# Patient Record
Sex: Female | Born: 1955 | Race: Black or African American | Hispanic: No
Health system: Midwestern US, Community
[De-identification: ages and names within clinical notes are randomized; demographics above are authoritative.]

## PROBLEM LIST (undated history)

## (undated) DIAGNOSIS — M1632 Unilateral osteoarthritis resulting from hip dysplasia, left hip: Secondary | ICD-10-CM

## (undated) DIAGNOSIS — Z01818 Encounter for other preprocedural examination: Secondary | ICD-10-CM

## (undated) DIAGNOSIS — J342 Deviated nasal septum: Secondary | ICD-10-CM

## (undated) DIAGNOSIS — M25552 Pain in left hip: Secondary | ICD-10-CM

## (undated) DIAGNOSIS — M1990 Unspecified osteoarthritis, unspecified site: Secondary | ICD-10-CM

## (undated) DIAGNOSIS — F419 Anxiety disorder, unspecified: Secondary | ICD-10-CM

## (undated) DIAGNOSIS — H269 Unspecified cataract: Secondary | ICD-10-CM

## (undated) DIAGNOSIS — E785 Hyperlipidemia, unspecified: Secondary | ICD-10-CM

## (undated) DIAGNOSIS — I10 Essential (primary) hypertension: Secondary | ICD-10-CM

## (undated) DIAGNOSIS — E559 Vitamin D deficiency, unspecified: Secondary | ICD-10-CM

## (undated) DIAGNOSIS — N6009 Solitary cyst of unspecified breast: Secondary | ICD-10-CM

## (undated) HISTORY — PX: TUBAL LIGATION: SHX77

## (undated) HISTORY — DX: Essential (primary) hypertension: I10

## (undated) HISTORY — DX: Unspecified osteoarthritis, unspecified site: M19.90

## (undated) HISTORY — DX: Solitary cyst of unspecified breast: N60.09

## (undated) HISTORY — PX: ABDOMINAL HYSTERECTOMY: SHX81

## (undated) HISTORY — DX: Anxiety disorder, unspecified: F41.9

## (undated) HISTORY — DX: Vitamin D deficiency, unspecified: E55.9

## (undated) HISTORY — DX: Unspecified cataract: H26.9

## (undated) HISTORY — PX: CYSTECTOMY: SUR359

## (undated) HISTORY — DX: Hyperlipidemia, unspecified: E78.5

---
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
## 1986-06-15 HISTORY — PX: TUBAL LIGATION: SHX77

## 1986-06-15 HISTORY — PX: BILATERAL SALPINGECTOMY: SHX5743

## 1993-05-05 HISTORY — PX: BREAST EXCISIONAL BIOPSY: SUR124

## 1994-06-15 HISTORY — PX: ABDOMINAL HYSTERECTOMY: SHX81

## 1997-11-27 ENCOUNTER — Encounter: Admission: RE | Admit: 1997-11-27 | Discharge: 1998-02-25 | Payer: Self-pay | Admitting: Internal Medicine

## 1998-03-27 ENCOUNTER — Other Ambulatory Visit: Admission: RE | Admit: 1998-03-27 | Discharge: 1998-03-27 | Payer: Self-pay | Admitting: Obstetrics and Gynecology

## 1998-05-29 ENCOUNTER — Inpatient Hospital Stay (HOSPITAL_COMMUNITY): Admission: RE | Admit: 1998-05-29 | Discharge: 1998-06-01 | Payer: Self-pay | Admitting: Obstetrics and Gynecology

## 1998-06-15 HISTORY — PX: CYSTECTOMY: SUR359

## 1998-09-17 ENCOUNTER — Encounter: Admission: RE | Admit: 1998-09-17 | Discharge: 1998-12-16 | Payer: Self-pay | Admitting: Radiation Oncology

## 2005-10-19 ENCOUNTER — Ambulatory Visit: Payer: Self-pay | Admitting: Pulmonary Disease

## 2005-10-29 ENCOUNTER — Ambulatory Visit: Payer: Self-pay | Admitting: Internal Medicine

## 2007-02-07 ENCOUNTER — Ambulatory Visit: Payer: Self-pay | Admitting: Family Medicine

## 2007-06-17 ENCOUNTER — Telehealth (INDEPENDENT_AMBULATORY_CARE_PROVIDER_SITE_OTHER): Payer: Self-pay | Admitting: *Deleted

## 2007-06-21 ENCOUNTER — Encounter: Payer: Self-pay | Admitting: Pulmonary Disease

## 2007-06-28 DIAGNOSIS — M545 Low back pain, unspecified: Secondary | ICD-10-CM | POA: Insufficient documentation

## 2007-06-28 DIAGNOSIS — Z9079 Acquired absence of other genital organ(s): Secondary | ICD-10-CM | POA: Insufficient documentation

## 2007-06-28 DIAGNOSIS — I10 Essential (primary) hypertension: Secondary | ICD-10-CM | POA: Insufficient documentation

## 2007-06-28 DIAGNOSIS — E7800 Pure hypercholesterolemia, unspecified: Secondary | ICD-10-CM | POA: Insufficient documentation

## 2007-06-28 DIAGNOSIS — IMO0002 Reserved for concepts with insufficient information to code with codable children: Secondary | ICD-10-CM | POA: Insufficient documentation

## 2007-06-29 ENCOUNTER — Ambulatory Visit: Payer: Self-pay | Admitting: Pulmonary Disease

## 2007-06-29 LAB — CONVERTED CEMR LAB
Bilirubin Urine: NEGATIVE
Blood in Urine, dipstick: NEGATIVE
Glucose, Urine, Semiquant: NEGATIVE
Ketones, urine, test strip: NEGATIVE
Nitrite: NEGATIVE
Protein, U semiquant: NEGATIVE
Specific Gravity, Urine: 1.015
Urobilinogen, UA: NEGATIVE
WBC Urine, dipstick: NEGATIVE
pH: 6

## 2007-07-03 DIAGNOSIS — R519 Headache, unspecified: Secondary | ICD-10-CM | POA: Insufficient documentation

## 2007-07-03 DIAGNOSIS — R51 Headache: Secondary | ICD-10-CM | POA: Insufficient documentation

## 2007-07-03 LAB — CONVERTED CEMR LAB
ALT: 16 units/L (ref 0–35)
AST: 15 units/L (ref 0–37)
Albumin: 4.1 g/dL (ref 3.5–5.2)
Alkaline Phosphatase: 55 units/L (ref 39–117)
BUN: 15 mg/dL (ref 6–23)
Basophils Absolute: 0 10*3/uL (ref 0.0–0.1)
Basophils Relative: 0.4 % (ref 0.0–1.0)
Bilirubin, Direct: 0.1 mg/dL (ref 0.0–0.3)
CO2: 28 meq/L (ref 19–32)
Calcium: 9.6 mg/dL (ref 8.4–10.5)
Chloride: 101 meq/L (ref 96–112)
Cholesterol: 224 mg/dL (ref 0–200)
Creatinine, Ser: 1 mg/dL (ref 0.4–1.2)
Direct LDL: 166.7 mg/dL
Eosinophils Absolute: 0 10*3/uL (ref 0.0–0.6)
Eosinophils Relative: 0.3 % (ref 0.0–5.0)
GFR calc Af Amer: 75 mL/min
GFR calc non Af Amer: 62 mL/min
Glucose, Bld: 86 mg/dL (ref 70–99)
HCT: 43 % (ref 36.0–46.0)
HDL: 41.8 mg/dL (ref >39.0)
Hemoglobin: 14.9 g/dL (ref 12.0–15.0)
Lymphocytes Relative: 25.8 % (ref 12.0–46.0)
MCHC: 34.7 g/dL (ref 30.0–36.0)
MCV: 88.1 fL (ref 78.0–100.0)
Monocytes Absolute: 0.6 10*3/uL (ref 0.2–0.7)
Monocytes Relative: 13.5 % — ABNORMAL HIGH (ref 3.0–11.0)
Neutro Abs: 2.6 10*3/uL (ref 1.4–7.7)
Neutrophils Relative %: 60 % (ref 43.0–77.0)
Platelets: 291 10*3/uL (ref 150–400)
Potassium: 2.9 meq/L — ABNORMAL LOW (ref 3.5–5.1)
RBC: 4.88 M/uL (ref 3.87–5.11)
RDW: 12.3 % (ref 11.5–14.6)
Sodium: 137 meq/L (ref 135–145)
TSH: 2.1 microintl units/mL (ref 0.35–5.50)
Total Bilirubin: 0.8 mg/dL (ref 0.3–1.2)
Total CHOL/HDL Ratio: 5.4
Total Protein: 7.8 g/dL (ref 6.0–8.3)
Triglycerides: 67 mg/dL (ref 0–149)
VLDL: 13 mg/dL (ref 0–40)
WBC: 4.3 10*3/uL — ABNORMAL LOW (ref 4.5–10.5)

## 2008-06-15 HISTORY — PX: COLONOSCOPY: SHX174

## 2008-06-15 HISTORY — PX: MOUTH SURGERY: SHX715

## 2008-06-26 ENCOUNTER — Ambulatory Visit: Payer: Self-pay | Admitting: Family Medicine

## 2008-06-26 DIAGNOSIS — J069 Acute upper respiratory infection, unspecified: Secondary | ICD-10-CM | POA: Insufficient documentation

## 2008-08-30 ENCOUNTER — Ambulatory Visit: Payer: Self-pay | Admitting: Internal Medicine

## 2008-10-02 ENCOUNTER — Ambulatory Visit: Payer: Self-pay | Admitting: Internal Medicine

## 2009-07-09 ENCOUNTER — Telehealth: Payer: Self-pay | Admitting: Adult Health

## 2009-07-12 ENCOUNTER — Encounter: Payer: Self-pay | Admitting: Adult Health

## 2009-07-12 ENCOUNTER — Ambulatory Visit: Payer: Self-pay | Admitting: Family Medicine

## 2009-07-12 DIAGNOSIS — R5383 Other fatigue: Secondary | ICD-10-CM

## 2009-07-12 DIAGNOSIS — R5381 Other malaise: Secondary | ICD-10-CM | POA: Insufficient documentation

## 2009-07-12 DIAGNOSIS — M810 Age-related osteoporosis without current pathological fracture: Secondary | ICD-10-CM | POA: Insufficient documentation

## 2009-07-12 DIAGNOSIS — R748 Abnormal levels of other serum enzymes: Secondary | ICD-10-CM | POA: Insufficient documentation

## 2009-07-12 DIAGNOSIS — N3000 Acute cystitis without hematuria: Secondary | ICD-10-CM | POA: Insufficient documentation

## 2009-07-13 ENCOUNTER — Encounter: Payer: Self-pay | Admitting: Adult Health

## 2009-07-16 ENCOUNTER — Ambulatory Visit: Payer: Self-pay | Admitting: Pulmonary Disease

## 2009-07-16 DIAGNOSIS — E559 Vitamin D deficiency, unspecified: Secondary | ICD-10-CM

## 2009-07-16 HISTORY — DX: Vitamin D deficiency, unspecified: E55.9

## 2009-07-16 LAB — CONVERTED CEMR LAB
ALT: 14 units/L (ref 0–35)
AST: 19 units/L (ref 0–37)
Albumin: 4.2 g/dL (ref 3.5–5.2)
Alkaline Phosphatase: 45 units/L (ref 39–117)
BUN: 10 mg/dL (ref 6–23)
Basophils Absolute: 0 10*3/uL (ref 0.0–0.1)
Basophils Relative: 0.4 % (ref 0.0–3.0)
Bilirubin Urine: NEGATIVE
Bilirubin, Direct: 0.1 mg/dL (ref 0.0–0.3)
CO2: 29 meq/L (ref 19–32)
Calcium: 9.4 mg/dL (ref 8.4–10.5)
Casts: NONE SEEN /lpf
Chloride: 104 meq/L (ref 96–112)
Cholesterol: 195 mg/dL (ref 0–200)
Creatinine, Ser: 0.8 mg/dL (ref 0.4–1.2)
Crystals: NONE SEEN
Eosinophils Absolute: 0 10*3/uL (ref 0.0–0.7)
Eosinophils Relative: 0.4 % (ref 0.0–5.0)
GFR calc non Af Amer: 96.72 mL/min (ref >60)
Glucose, Bld: 87 mg/dL (ref 70–99)
HCT: 40.9 % (ref 36.0–46.0)
HDL: 47.6 mg/dL (ref >39.00)
Hemoglobin, Urine: NEGATIVE
Hemoglobin: 13.6 g/dL (ref 12.0–15.0)
Ketones, ur: NEGATIVE mg/dL
LDL Cholesterol: 138 mg/dL — ABNORMAL HIGH (ref 0–99)
Leukocytes, UA: NEGATIVE
Lymphocytes Relative: 24.9 % (ref 12.0–46.0)
Lymphs Abs: 1 10*3/uL (ref 0.7–4.0)
MCHC: 33.3 g/dL (ref 30.0–36.0)
MCV: 90.3 fL (ref 78.0–100.0)
Monocytes Absolute: 0.5 10*3/uL (ref 0.1–1.0)
Monocytes Relative: 13 % — ABNORMAL HIGH (ref 3.0–12.0)
Neutro Abs: 2.5 10*3/uL (ref 1.4–7.7)
Neutrophils Relative %: 61.3 % (ref 43.0–77.0)
Nitrite: NEGATIVE
Platelets: 215 10*3/uL (ref 150.0–400.0)
Potassium: 3.4 meq/L — ABNORMAL LOW (ref 3.5–5.1)
Protein, ur: NEGATIVE mg/dL
RBC / HPF: NONE SEEN (ref <3)
RBC: 4.53 M/uL (ref 3.87–5.11)
RDW: 12.5 % (ref 11.5–14.6)
Sodium: 139 meq/L (ref 135–145)
Specific Gravity, Urine: 1.026 (ref 1.005–1.030)
Squamous Epithelial / LPF: NONE SEEN /lpf
TSH: 2.5 microintl units/mL (ref 0.35–5.50)
Total Bilirubin: 0.7 mg/dL (ref 0.3–1.2)
Total CHOL/HDL Ratio: 4
Total Protein: 7.8 g/dL (ref 6.0–8.3)
Triglycerides: 48 mg/dL (ref 0.0–149.0)
Urine Glucose: NEGATIVE mg/dL
Urobilinogen, UA: 0.2 (ref 0.0–1.0)
VLDL: 9.6 mg/dL (ref 0.0–40.0)
Vit D, 25-Hydroxy: 10 ng/mL — ABNORMAL LOW (ref 30–89)
WBC: 4 10*3/uL — ABNORMAL LOW (ref 4.5–10.5)
pH: 6 (ref 5.0–8.0)

## 2009-08-20 ENCOUNTER — Ambulatory Visit: Payer: Self-pay | Admitting: Obstetrics & Gynecology

## 2009-09-11 ENCOUNTER — Ambulatory Visit (HOSPITAL_COMMUNITY): Admission: RE | Admit: 2009-09-11 | Discharge: 2009-09-11 | Payer: Self-pay | Admitting: Obstetrics & Gynecology

## 2009-09-18 ENCOUNTER — Encounter: Admission: RE | Admit: 2009-09-18 | Discharge: 2009-09-18 | Payer: Self-pay | Admitting: Obstetrics & Gynecology

## 2009-09-30 ENCOUNTER — Ambulatory Visit: Payer: Self-pay | Admitting: Family Medicine

## 2009-09-30 DIAGNOSIS — M79609 Pain in unspecified limb: Secondary | ICD-10-CM | POA: Insufficient documentation

## 2009-09-30 DIAGNOSIS — S92919A Unspecified fracture of unspecified toe(s), initial encounter for closed fracture: Secondary | ICD-10-CM | POA: Insufficient documentation

## 2009-10-18 ENCOUNTER — Telehealth (INDEPENDENT_AMBULATORY_CARE_PROVIDER_SITE_OTHER): Payer: Self-pay | Admitting: *Deleted

## 2009-10-18 ENCOUNTER — Ambulatory Visit: Payer: Self-pay | Admitting: Pulmonary Disease

## 2009-10-22 LAB — CONVERTED CEMR LAB: Vit D, 25-Hydroxy: 35 ng/mL (ref 30–89)

## 2010-05-13 ENCOUNTER — Telehealth (INDEPENDENT_AMBULATORY_CARE_PROVIDER_SITE_OTHER): Payer: Self-pay | Admitting: *Deleted

## 2010-07-06 ENCOUNTER — Encounter: Payer: Self-pay | Admitting: Obstetrics & Gynecology

## 2010-07-15 NOTE — Miscellaneous (Signed)
Summary: [facility]  Clinical Lists Changes  Medications: Added new medication of MIRALAX   POWD (POLYETHYLENE GLYCOL 3350) As per prep  instructions. - Signed Added new medication of DULCOLAX 5 MG  TBEC (BISACODYL) Day before procedure take 2 at 3pm and 2 at 8pm. - Signed Added new medication of REGLAN 10 MG  TABS (METOCLOPRAMIDE HCL) As per prep instructions. - Signed Rx of MIRALAX   POWD (POLYETHYLENE GLYCOL 3350) As per prep  instructions.;  #255gm x 0;  Signed;  Entered by: Ezra Sites RN;  Authorized by: Hart Carwin;  Method used: [id number] [address], [place], [place]  [address], Ph: [phone] or [phone], Fax: [phone] Rx of DULCOLAX 5 MG  TBEC (BISACODYL) Day before procedure take 2 at 3pm and 2 at 8pm.;  #4 x 0;  Signed;  Entered by: Ezra Sites RN;  Authorized by: Hart Carwin;  Method used: [facility] [place], [place]  [address], Ph: [phone] or [phone], Fax: [phone] Rx of REGLAN 10 MG  TABS (METOCLOPRAMIDE HCL) As per prep instructions.;  #2 x 0;  Signed;  Entered by: Ezra Sites RN;  Authorized by: Hart Carwin;  Method used: Electronically [facility] [place], [place]  [address], Ph: [phone] or [phone], Fax: [phone] Observations: Added new observation of NKA: T (08/30/2008 15:39)    Prescriptions: REGLAN 10 MG  TABS (METOCLOPRAMIDE HCL) As per prep instructions.  #2 x 0   Entered by:   Ezra Sites RN   Authorized by:   Hart Carwin   Signed by:   Ezra Sites RN on 08/30/2008   Method used:   Electronically to        [facility] (retail)       [address]       [place], [place]  [address]       Ph: [phone] or [phone]       Fax: [phone]   RxID:   [id number] DULCOLAX 5 MG  TBEC (BISACODYL) Day before procedure take 2 at 3pm and 2 at 8pm.  #4 x 0   Entered by:   Ezra Sites RN   Authorized by:   Hart Carwin   Signed by:   Ezra Sites RN on 08/30/2008   Method used:   Electronically to        [facility] (retail)       [address]       [place], [place]  [address]       Ph: [phone] or [phone]       Fax: [phone]   RxID:   [id number] MIRALAX   POWD (POLYETHYLENE GLYCOL 3350) As per prep  instructions.  #255gm x 0   Entered by:   Ezra Sites RN   Authorized by:   Hart Carwin   Signed by:   Ezra Sites RN on 08/30/2008   Method used:   Electronically to        [facility] (retail)       [address]       [place], [place]  [address]       Ph: [phone] or [phone]       Fax: [phone]   RxID:   [id number]

## 2010-07-15 NOTE — Assessment & Plan Note (Signed)
Summary: cpx/ mbw   Chief Complaint:  Yearly CPX....  History of Present Illness: 55 y/o BF here for a yearly follow up and physical exam... Jacqueline Waters looks great, is doing well and has no specific complaints today... 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  Current Problems:  PHYSICAL EXAMINATION (ICD-V70.0) - she has turned 51 therefore will need referral to GI for routine colon... reminded to get her GYN check and mammogram... HYPERTENSION (ICD-401.9) - controlled on LOTREL & MICARDIS/HCT... takes meds regularly and tolerating well without side effects... BP's all 120's/ 80's at home and work... 126/64 here today... denies HA, fatigue, visual changes, CP, palipit, dizziness, syncope, dyspnea, edema, etc... Family Hx of CAD (ICD-414.00) - she had a Cardiolite in 2001= normal without ischemia and EF=73%... HYPERCHOLESTEROLEMIA (ICD-272.0) - she has not been on medication, trying to control w/ diet alone... last FLP 5/07 showed TChol 190, TG 40, HDL 44, LDL138... her LDL's have been betw 124-168 for years and we discussed starting a statin med if results are similiar today... HYSTERECTOMY, HX OF (ICD-V45.77) DEGENERATIVE DISC DISEASE (ICD-722.6) BACK PAIN, LUMBAR (ICD-724.2) HEADACHE, MIXED (ICD-784.0) - responds well to CEPHADYN 50-650 (Butalbital-Acetomenophen) perscribed by drLown in the past... she would like to continue this med...     Current Allergies: No known allergies   Past Medical History:        HYPERTENSION (ICD-401.9)    Family Hx of CAD (ICD-414.00)    HYPERCHOLESTEROLEMIA (ICD-272.0)    HYSTERECTOMY, HX OF (ICD-V45.77)    DEGENERATIVE DISC DISEASE (ICD-722.6)    BACK PAIN, LUMBAR (ICD-724.2)    HEADACHE, MIXED (ICD-784.0)      Past Surgical History:    Hysterectomy     Review of Systems  The patient denies fever, chills, sweats, anorexia, fatigue, weakness, malaise, weight  loss, sleep disorder, blurring, diplopia, eye irritation, eye discharge, vision loss, eye pain, photophobia, earache, ear discharge, tinnitus, decreased hearing, nasal congestion, nosebleeds, sore throat, hoarseness, chest pain, palpitations, syncope, dyspnea on exertion, orthopnea, PND, peripheral edema, cough, dyspnea at rest, excessive sputum, hemoptysis, wheezing, pleurisy, nausea, vomiting, diarrhea, constipation, change in bowel habits, abdominal pain, melena, hematochezia, jaundice, gas/bloating, indigestion/heartburn, dysphagia, odynophagia, dysuria, hematuria, urinary frequency, urinary hesitancy, nocturia, incontinence, back pain, joint pain, joint swelling, muscle cramps, muscle weakness, stiffness, arthritis, sciatica, restless legs, leg pain at night, leg pain with exertion, rash, itching, dryness, suspicious lesions, paralysis, paresthesias, seizures, tremors, vertigo, transient blindness, frequent falls, frequent headaches, difficulty walking, depression, anxiety, memory loss, confusion, cold intolerance, heat intolerance, polydipsia, polyphagia, polyuria, unusual weight change, abnormal bruising, bleeding, enlarged lymph nodes, urticaria, allergic rash, hay fever, and recurrent infections.      Physical Exam  WD, WN, 55 y/o BF in NAD... GENERAL:  Alert & oriented; pleasant & cooperative. HEENT:  /AT, EOM-wnl, PERRLA, Fundi-benign, EACs-clear, TMs-wnl, NOSE-clear, THROAT-clear & wnl. NECK:  Supple w/ full ROM; no JVD; normal carotid impulses w/o bruits; no thyromegaly or nodules palpated; no lymphadenopathy. CHEST:  Clear to P & A; without wheezes/ rales/ or rhonchi. HEART:  Regular Rhythm; without murmurs/ rubs/ or gallops. ABDOMEN:  Soft & nontender; normal bowel sounds; no organomegaly or masses detected. EXT: without deformities or arthritic changes; no varicose veins/ venous insuffic/ or edema. NEURO:  CN's intact; motor testing normal; sensory testing normal; gait normal &  balance OK. DERM:  No lesions noted; no rash etc...       Impression & Recommendations:  Problem #  1:  Preventive Health Care (ICD-V70.0) Discussed GYN eval, mammogram, colonoscopy... she will sched at her convenience...  CXR- NAD EKG- NSR, NSSTTWA. LABS: FLP= TChol 224, TG 67, HDL 42, LDL 167... Rec ~ START LIPITOR 20 mg daily... f/u FLP in 2-3 months.             CBC= normal             CMET= normal             TSH= normal  Problem # 2:  HYPERTENSION (ICD-401.9) Controlled... continue same Rx... refills per request. Her updated medication list for this problem includes:    Lotrel 10-20 Mg Caps (Amlodipine besy-benazepril hcl) .[name]... Take 1 cap daily    Micardis Hct 80-12.5 Mg Tabs (Telmisartan-hctz) .[name]... Take 1 tablet by mouth once a day   Problem # 3:  HYPERCHOLESTEROLEMIA (ICD-272.0) If labs return w/ elevated Chol we discussed starting Statin Rx, esp w/ her family hx...  Complete Medication List: 1)  Lotrel 10-20 Mg Caps (Amlodipine besy-benazepril hcl) .... Take 1 cap daily 2)  Micardis Hct 80-12.5 Mg Tabs (Telmisartan-hctz) .... Take 1 tablet by mouth once a day 3)  Potassium Chloride Crys Cr 10 Meq Tbcr (Potassium chloride crys cr) .... Take 2 caps daily as directed 4)  Cephadyn 50-650 Mg Tabs (Butalbital-acetaminophen) .[name].. 1 tab by mouth q 6-8 h as needed for headaches   Patient Instructions: 1)  Today we refilled your 3 meds (see below) for 90 day supplies.Marland KitchenMarland Kitchen 2)  We also did your yearly lab work... please call the "phone tree" in a few days for your lab results.Marland KitchenMarland Kitchen 3)  If your Cholesterol returns elevated we may want to start a low dose of Lipitor... 4)  Limit your Sodium (Salt). 5)  Try to start an exercise program as discussed...    Prescriptions: POTASSIUM CHLORIDE CRYS CR 10 MEQ  TBCR (POTASSIUM CHLORIDE CRYS CR) take 2 caps daily as directed  #180 x 4   Entered and Authorized by:   Michele Mcalpine MD   Signed by:   Michele Mcalpine MD on 06/29/2007   Method  used:   Print then Give to Patient   RxID:   [id number] MICARDIS HCT 80-12.5 MG  TABS (TELMISARTAN-HCTZ) Take 1 tablet by mouth once a day  #90 x 4   Entered and Authorized by:   Michele Mcalpine MD   Signed by:   Michele Mcalpine MD on 06/29/2007   Method used:   Print then Give to Patient   RxID:   [id number] LOTREL 10-20 MG  CAPS (AMLODIPINE BESY-BENAZEPRIL HCL) take 1 cap daily  #90 x 4   Entered and Authorized by:   Michele Mcalpine MD   Signed by:   Michele Mcalpine MD on 06/29/2007   Method used:   Print then Give to Patient   RxID:   [id number]  ]

## 2010-07-15 NOTE — Miscellaneous (Signed)
  Medications Added CEPHADYN 50-650 MG  TABS (BUTALBITAL-ACETAMINOPHEN) 1 tab by mouth Q 6-8 H as needed for headaches       Clinical Lists Changes  Medications: Added new medication of CEPHADYN 50-650 MG  TABS (BUTALBITAL-ACETAMINOPHEN) 1 tab by mouth Q 6-8 H as needed for headaches - Signed Rx of CEPHADYN 50-650 MG  TABS (BUTALBITAL-ACETAMINOPHEN) 1 tab by mouth Q 6-8 H as needed for headaches;  #50 x 1;  Signed;  Entered by: Michele Mcalpine MD;  Authorized by: Michele Mcalpine MD;  Method used: Historical Observations: Added new observation of LLIMPORTMEDS: completed (06/21/2007 18:14)    Prescriptions: CEPHADYN 50-650 MG  TABS (BUTALBITAL-ACETAMINOPHEN) 1 tab by mouth Q 6-8 H as needed for headaches  #50 x 1   Entered and Authorized by:   Michele Mcalpine MD   Signed by:   Michele Mcalpine MD on 06/21/2007   Method used:   Historical   RxID:   [id number]

## 2010-07-15 NOTE — Progress Notes (Signed)
Summary: Cough > augmentin, MMW, delsym  Phone Note Call from Patient Call back at [phone]   Caller: Patient Summary of Call: Jacqueline Waters has had bad sore throat and coughing for 5 days.  Needs something for cough.  [facility] [place]  Doesn't know phone #. Initial call taken by: Leonette Monarch,  May 13, 2010 8:26 AM  Follow-up for Phone Call        LM for pt to call back to get detailed symptoms. Abigail Miyamoto RN  May 13, 2010 9:11 AM   Additional Follow-up for Phone Call Additional follow up Details #1::        Pt c/o sorethroat, hoarseness, prod cough Chilton Si- clear).  Denies fever.  NKDA.  [facility] Abigail Miyamoto RN  May 13, 2010 12:38 PM      Additional Follow-up for Phone Call Additional follow up Details #2::    per SN----ok for augmentin 875mg    #14  1 by mouth two times a day ---if not pcn allergic----mmw #4oz  1 tsp gargle and swallow four times daily and use otc delsym as needed for cough.  thanks Randell Loop CMA  May 13, 2010 1:53 PM   Called, spoke with pt.  She verified no pcn allergy.  Informed of above recs per SN.  She verbalized understanding and aware rxs [facility].  Gweneth Dimitri RN  May 13, 2010 2:07 PM   New/Updated Medications: AUGMENTIN 875-125 MG TABS (AMOXICILLIN-POT CLAVULANATE) Take 1 tablet by mouth two times a day * MAJIC MOUTHWASH 1 tsp garle and swallow four times daily Prescriptions: MAJIC MOUTHWASH 1 tsp garle and swallow four times daily  #4 oz x 0   Entered by:   Gweneth Dimitri RN   Authorized by:   Michele Mcalpine MD   Signed by:   Gweneth Dimitri RN on 05/13/2010   Method used:   [facility]ed to ...       [id number] ([facility]ail)       [address]       [place], [place]  [address]       Ph: [phone] or [phone]       Fax: [phone]   RxID:   [id number] AUGMENTIN 875-125 MG TABS (AMOXICILLIN-POT CLAVULANATE) Take 1 tablet by mouth two times a day  #14 x 0   Entered by:    Gweneth Dimitri RN   Authorized by:   Michele Mcalpine MD   Signed by:   Gweneth Dimitri RN on 05/13/2010   Method used:   [facility]ronically to        [facility] (retail)       [address]       [place], [place]  [address]       Ph: [phone] or [phone]       Fax: [phone]   RxID:   [id number]

## 2010-07-15 NOTE — Assessment & Plan Note (Signed)
Summary: BOTOX/ALJ  Nurse Visit

## 2010-07-15 NOTE — Progress Notes (Signed)
Summary: need refill  Medications Added LOTREL 10-20 MG  CAPS (AMLODIPINE BESY-BENAZEPRIL HCL) once daily       Phone Note Call from Patient   Caller: Patient Call For: nadel Summary of Call: need refill on lotrel [facility] [address] Patient's chart has been requested.  Initial call taken by: Rickard Patience,  June 17, 2007 3:12 PM  Follow-up for Phone Call        rx was sent electronically.  lm with pts son to make her aware. Follow-up by: Vernie Murders,  June 17, 2007 3:20 PM    New/Updated Medications: LOTREL 10-20 MG  CAPS (AMLODIPINE BESY-BENAZEPRIL HCL) once daily   Prescriptions: LOTREL 10-20 MG  CAPS (AMLODIPINE BESY-BENAZEPRIL HCL) once daily  #30 x 0   Entered by:   Vernie Murders   Authorized by:   Michele Mcalpine MD   Signed by:   Vernie Murders on 06/17/2007   Method used:   Electronically sent to ...       [facility] [id number]       [address]       [place], [place]  [address]       Ph: [phone] or [phone]       Fax: [phone]   RxID:   [id number]

## 2010-07-15 NOTE — Progress Notes (Signed)
Summary: order  Phone Note Call from Patient   Caller: Patient Call For: nadel Summary of Call: need order put in to have vitamin D checked Initial call taken by: Rickard Patience,  Oct 18, 2009 8:26 AM  Follow-up for Phone Call        Order put in computer for vit d level per Tammy's recommendations at last ov. Abigail Miyamoto RN  Oct 18, 2009 9:27 AM

## 2010-07-15 NOTE — Procedures (Signed)
Summary: Colonoscopy   Colonoscopy  Procedure date:  10/02/2008  Findings:      Location:  [facility].    Procedures Next Due Date:    Colonoscopy: 10/2018  COLONOSCOPY PROCEDURE REPORT  PATIENT:  Jacqueline Waters, Jacqueline Waters  MR#:  [id number] BIRTHDATE:   1955/10/16, 52 yrs. old   GENDER:   female  ENDOSCOPIST:   Hedwig Morton. Juanda Chance, MD Referred by: Alroy Dust, M.D.  PROCEDURE DATE:  10/02/2008 PROCEDURE:  Average-risk screening colonoscopy ASA CLASS:   Class I INDICATIONS: screening   MEDICATIONS:    Versed 7 mg, Fentanyl 50 mcg  DESCRIPTION OF PROCEDURE:   After the risks benefits and alternatives of the procedure were thoroughly explained, informed consent was obtained.  Digital rectal exam was performed and revealed no rectal masses.   The LB [id number] endoscope was introduced through the anus and advanced to the cecum, which was identified by both the appendix and ileocecal valve, without limitations.  The quality of the prep was good, using MiraLax.  The instrument was then slowly withdrawn as the colon was fully examined. <<PROCEDUREIMAGES>>      <<OLD IMAGES>>  FINDINGS:  No polyps or cancers were seen (see image1, image2, image3, and image4).   Retroflexed views in the rectum revealed no abnormalities.    The scope was then withdrawn from the patient and the procedure completed.  COMPLICATIONS:   None  ENDOSCOPIC IMPRESSION:  1) No polyps or cancers  normal exam to the cecum RECOMMENDATIONS:  1) hemoccults q 2-3 years  2) high fiber diet  REPEAT EXAM:   In 10 year(s) for.   _______________________________ Hedwig Morton. Juanda Chance, MD  CC: Alroy Dust, MD

## 2010-07-15 NOTE — Assessment & Plan Note (Signed)
Summary: BROKEN TOE ?   Vital Signs:  Patient profile:   55 year old female Height:      62.5 inches Weight:      138.38 pounds BMI:     25.00 Temp:     97.7 degrees F oral Pulse rate:   70 / minute Pulse rhythm:   regular  Vitals Entered By: Benny Lennert CMA Duncan Dull) (September 30, 2009 9:33 AM) CC: ? broken toe   History of Present Illness:  55 year old female who hit her R 5th toe on 09/27/2009, directly struck, now with focal pain at the 5th digit. 5th toe. Mild swelling and focal pain.  REVIEW OF SYSTEMS  GEN: No systemic complaints, no fevers, chills, sweats, or other acute illnesses MSK: Detailed in the HPI GI: tolerating PO intake without difficulty Neuro: No numbness, parasthesias, or tingling associated. Otherwise the pertinent positives of the ROS are noted above.     GEN: Well-developed,well-nourished,in no acute distress; alert,appropriate and cooperative throughout examination HEENT: Normocephalic and atraumatic without obvious abnormalities. No apparent alopecia or balding. Ears, externally no deformities PULM: Breathing comfortably in no respiratory distress EXT: No clubbing, cyanosis, or edema PSYCH: Normally interactive. Cooperative during the interview. Pleasant. Friendly and conversant. Not anxious or depressed appearing. Normal, full affect.   R foot: 5th toe, ip TTP, some swelling. No bruising.  other bony anatomy NT  Allergies: No Known Drug Allergies   Impression & Recommendations:  Problem # 1:  FRACTURE, TOE (ICD-826.0) 09/27/2009, doi  XR, R 5th toe Indication pain Nondisplaced, nonangulated, oblique fx in good position  tylenol, motrin as needed activity mod  Complete Medication List: 1)  Lotrel 10-20 Mg Caps (Amlodipine besy-benazepril hcl) .... Take 1 cap daily 2)  Micardis Hct 80-12.5 Mg Tabs (Telmisartan-hctz) .... Take 1 tablet by mouth once a day 3)  Potassium Chloride Crys Cr 10 Meq Tbcr (Potassium chloride crys cr) .... Take 2  caps daily as directed 4)  Cephadyn 50-650 Mg Tabs (Butalbital-acetaminophen) .[name].. 1 tab by mouth q 6-8 h as needed for headaches 5)  Vitamin D (ergocalciferol) 50000 Unit Caps (Ergocalciferol) .[name].. 1 by mouth weekly  Other Orders: Radiology other (Radiology Other)

## 2010-07-15 NOTE — Progress Notes (Signed)
Summary: labs  Phone Note Call from Patient Call back at [phone]   Caller: Patient Call For: tammy p / Kriste Basque Reason for Call: Talk to Nurse Summary of Call: need order for labs put in for them to be drawn at [facility] this week. Initial call taken by: Eugene Gavia,  July 09, 2009 1:08 PM  Follow-up for Phone Call        pt scheduled to see TP 07-16-2009 for f/u appt and refill on meds.  Pt was last seen by SN 06-29-2007.  Please advise what labs you want drawn and diag codes.  Thank you.  Aundra Millet Reynolds LPN  July 09, 2009 1:58 PM   Additional Follow-up for Phone Call Additional follow up Details #1::        cbc, bmet, hepatic, tsh, vit d , ua , lipid fasitng labs.  Additional Follow-up by: Rubye Oaks NP,  July 09, 2009 4:54 PM    Additional Follow-up for Phone Call Additional follow up Details #2::    [facility] for pt TCB Vernie Murders  July 09, 2009 4:59 PM   Additional Follow-up for Phone Call Additional follow up Details #3:: Details for Additional Follow-up Action Taken: orders faxed to [facility].  Aundra Millet Reynolds LPN  July 09, 2009 5:14 PM

## 2010-07-15 NOTE — Assessment & Plan Note (Signed)
Summary: COLD/RBH   Vital Signs:  Patient Profile:   55 Years Old Female Temp:     97.9 degrees F (36.61 degrees C) oral Pulse rate:   80 / minute Pulse rhythm:   regular BP sitting:   120 / 80  (left arm) Cuff size:   regular  Vitals Entered By: Silas Sacramento (June 26, 2008 8:16 AM)                 Chief Complaint:  Pryor Ochoa in right eye, chills, and head congestion.  Acute Visit History:      The patient complains of cough, eye symptoms, and nasal discharge.  These symptoms began 2 days ago.  She denies constipation, diarrhea, earache, fever, genitourinary symptoms, headache, musculoskeletal symptoms, nausea, rash, sinus problems, sore throat, and vomiting.  Other comments include: Sunday started to feel bad. Right eye started to hurt and got a really bad pain in her right eye.  This AM had a gook in her eye.   Taking Mucinex.        There is no history of wheezing, sleep interference, shortness of breath, respiratory retractions, tachypnea, cyanosis, or interference with oral intake associated with her cough.        Urine output has been normal.  She is tolerating clear liquids.           Current Allergies: No known allergies      Review of Systems      See HPI   Physical Exam  General:     Well-developed,well-nourished,in no acute distress; alert,appropriate and cooperative throughout examination Head:     Normocephalic and atraumatic without obvious abnormalities. No apparent alopecia or balding. Nose:     no external deformity.   Mouth:     Oral mucosa and oropharynx without lesions or exudates.  Teeth in good repair. Neck:     No deformities, masses, or tenderness noted. Lungs:     Normal respiratory effort, chest expands symmetrically. Lungs are clear to auscultation, no crackles or wheezes. Heart:     Normal rate and regular rhythm. S1 and S2 normal without gallop, murmur, click, rub or other extra sounds. Extremities:     No clubbing, cyanosis,  edema, or deformity noted with normal full range of motion of all joints.     Eye Exam  Visual Fields     OD: normal right     OS: normal left Ocular Motility     OD: normal right     OS: normal left Adnexa and Eyelids     OD: normal right     OS: normal left Conjunctiva     OD: normal right     OS: normal left Anterior Segment     OD: normal right     OS: normal left    Impression & Recommendations:  Problem # 1:  URI (ICD-465.9) Assessment: New Supportive care, cont with Mucinex and fluids  Eye pain at R eye, woke up from sleep. Examined with fluroscein and UV light - no corneal abrasion  Complete Medication List: 1)  Lotrel 10-20 Mg Caps (Amlodipine besy-benazepril hcl) .... Take 1 cap daily 2)  Micardis Hct 80-12.5 Mg Tabs (Telmisartan-hctz) .... Take 1 tablet by mouth once a day 3)  Potassium Chloride Crys Cr 10 Meq Tbcr (Potassium chloride crys cr) .... Take 2 caps daily as directed 4)  Cephadyn 50-650 Mg Tabs (Butalbital-acetaminophen) .[name].. 1 tab by mouth q 6-8 h as needed for headaches

## 2010-07-15 NOTE — Assessment & Plan Note (Signed)
Summary: rov/labs/apc   CC:  f/u labs and refills.  History of Present Illness: 55 y/o BF  with known history of HTN  July 16, 2009 --Presents for follow up and med refills. She is doing well, with no complaints. Feeling good. Working at [employer]. now. We reviewed her labs. Chol w/ LDL at 138. she is working on diet and exercise. We discussed taking her potassium daily as directed, it was slightly low at 3.4. Vitamin D was low at 10, she will begin calcium w/ d . Denies chest pain, dyspnea, orthopnea, hemoptysis, fever, n/v/d, edema, headache,recent travel or antibiotics.    Current Medications (verified): 1)  Lotrel 10-20 Mg  Caps (Amlodipine Besy-Benazepril Hcl) .... Take 1 Cap Daily 2)  Micardis Hct 80-12.5 Mg  Tabs (Telmisartan-Hctz) .... Take 1 Tablet By Mouth Once A Day 3)  Potassium Chloride Crys Cr 10 Meq  Tbcr (Potassium Chloride Crys Cr) .... Take 2 Caps Daily As Directed 4)  Cephadyn 50-650 Mg  Tabs (Butalbital-Acetaminophen) .[name].. 1 Tab By Mouth Q 6-8 H As Needed For Headaches  Allergies (verified): No Known Drug Allergies  Past History:  Past Surgical History: Last updated: 06/29/2007 Hysterectomy  Risk Factors: Smoking Status: never (06/28/2007)  Past Medical History: PHYSICAL EXAMINATION (ICD-V70.0) -she is due for her mammogram and pap--she is going to make apporintment this month Feb 2011>>  colon>>4/10 norm repeat in 2020  HYPERTENSION (ICD-401.9) - controlled on LOTREL & MICARDIS/HCT...   Family Hx of CAD (ICD-414.00) - she had a Cardiolite in 2001= normal without ischemia and EF=73%...  HYPERCHOLESTEROLEMIA (ICD-272.0) -  06/2009>>TC 195, LDL 135 -diet and exercise.   HYSTERECTOMY, HX OF (ICD-V45.77)  DEGENERATIVE DISC DISEASE (ICD-722.6)  BACK PAIN, LUMBAR (ICD-724.2)  HEADACHE, MIXED (ICD-784.0) - responds well to CEPHADYN 50-650 (Butalbital-Acetomenophen) perscribed by drLown in the past... she would like to continue this  med...     Review of Systems      See HPI  Vital Signs:  Patient profile:   55 year old female Height:      62.5 inches Weight:      138.38 pounds BMI:     25.00 O2 Sat:      98 % on Room air Temp:     97.7 degrees F oral Pulse rate:   70 / minute BP sitting:   130 / 80  (right arm) Cuff size:   regular  Vitals Entered By: Elray Buba RN (July 16, 2009 9:34 AM)  O2 Flow:  Room air CC: f/u labs,refills Is Patient Diabetic? No Comments Medications reviewed with patient  Elray Buba RN  July 16, 2009 9:49 AM    Physical Exam  Additional Exam:  WD, WN, 55 y/o BF in NAD... GENERAL:  Alert & oriented; pleasant & cooperative. HEENT:  [place]/AT, EOM-wnl, PERRLA, Fundi-benign, EACs-clear, TMs-wnl, NOSE-clear, THROAT-clear & wnl. NECK:  Supple w/ full ROM; no JVD; normal carotid impulses w/o bruits; no thyromegaly or nodules palpated; no lymphadenopathy. CHEST:  Clear to P & A; without wheezes/ rales/ or rhonchi. HEART:  Regular Rhythm; without murmurs/ rubs/ or gallops. ABDOMEN:  Soft & nontender; normal bowel sounds; no organomegaly or masses detected. EXT: without deformities or arthritic changes; no varicose veins/ venous insuffic/ or edema. NEURO:  CN's intact; motor testing normal; sensory testing normal; gait normal & balance OK. DERM:  No lesions noted; no rash etc...     Pre-Spirometry FVC    Pred: 3.07 L/min     % Pred:  0 % FEV1    Pred: 2.51 L     % Pred: 0 % FEV1/FVC  Pred: 0.81 %    FEF 25-75  Pred: 2.72 L/min     Impression & Recommendations:  Problem # 1:  VITAMIN D DEFICIENCY (ICD-268.9) need to start oscal d two times a day along  w/ vitamin d 50k weekly will recheck in 3 months   Problem # 2:  HYPERTENSION (ICD-401.9) controlled on rx.  Her updated medication list for this problem includes:    Lotrel 10-20 Mg Caps (Amlodipine besy-benazepril hcl) .[name]... Take 1 cap daily    Micardis Hct 80-12.5 Mg Tabs (Telmisartan-hctz) .[name]... Take 1 tablet by  mouth once a day  Problem # 3:  HYPERCHOLESTEROLEMIA (ICD-272.0) diet and exercise discussed.   Problem # 4:  PHYSICAL EXAMINATION (ICD-V70.0)  she has assured me she will set up mammogram and pap.   Orders: Est. Patient 40-64 years ([id number])  Medications Added to Medication List This Visit: 1)  Vitamin D (ergocalciferol) 50000 Unit Caps (Ergocalciferol) .[name].. 1 by mouth weekly  Complete Medication List: 1)  Lotrel 10-20 Mg Caps (Amlodipine besy-benazepril hcl) .... Take 1 cap daily 2)  Micardis Hct 80-12.5 Mg Tabs (Telmisartan-hctz) .... Take 1 tablet by mouth once a day 3)  Potassium Chloride Crys Cr 10 Meq Tbcr (Potassium chloride crys cr) .... Take 2 caps daily as directed 4)  Cephadyn 50-650 Mg Tabs (Butalbital-acetaminophen) .[name].. 1 tab by mouth q 6-8 h as needed for headaches 5)  Vitamin D (ergocalciferol) 50000 Unit Caps (Ergocalciferol) .[name].. 1 by mouth weekly  Patient Instructions: 1)  Add Calcium with vitamin d two times a day  2)  Begin Vitamin D 84132 units weekly 3)  Recheck vitamin d level in 3 months at office viist.  4)  Diet and exercise.  5)  follow up as scheduled and as needed  Prescriptions: CEPHADYN 50-650 MG  TABS (BUTALBITAL-ACETAMINOPHEN) 1 tab by mouth Q 6-8 H as needed for headaches  #50 x 1   Entered and Authorized by:   Rubye Oaks NP   Signed by:   Rubye Oaks NP on 07/16/2009   Method used:   Electronically to        [facility]* (retail)       [address]       [address] [place] [address]       [place], [place]  [address]       Ph: [phone]       Fax: [phone]   RxID:   [id number] POTASSIUM CHLORIDE CRYS CR 10 MEQ  TBCR (POTASSIUM CHLORIDE CRYS CR) take 2 caps daily as directed  #180 Tablet x 3   Entered and Authorized by:   Rubye Oaks NP   Signed by:   Rubye Oaks NP on 07/16/2009   Method used:   Electronically to        [facility]* (retail)       [address]       [address]mail[address]       [place], [place]  [address]       Ph: [phone]       Fax: [phone]   RxID:   [id number] MICARDIS HCT 80-12.5 MG  TABS (TELMISARTAN-HCTZ) Take 1 tablet by mouth once a day  #90 x 3   Entered and Authorized by:   Rubye Oaks NP   Signed by:   Rubye Oaks NP on  07/16/2009   Method used:   Electronically to        [facility]* (retail)       [address]       [address]mail[address]       [place], [place]  [address]       Ph: [phone]       Fax: [phone]   RxID:   [id number] LOTREL 10-20 MG  CAPS (AMLODIPINE BESY-BENAZEPRIL HCL) take 1 cap daily  #90 x 3   Entered and Authorized by:   Rubye Oaks NP   Signed by:   Rubye Oaks NP on 07/16/2009   Method used:   Electronically to        [facility]* (retail)       [address]       [address] [place] [address]       [place], [place]  [address]       Ph: [phone]       Fax: [phone]   RxID:   [id number] VITAMIN D (ERGOCALCIFEROL) 50000 UNIT CAPS (ERGOCALCIFEROL) 1 by mouth weekly  #4 x 2   Entered and Authorized by:   Rubye Oaks NP   Signed by:   Rubye Oaks NP on 07/16/2009   Method used:   Electronically to        [facility]* (retail)       [address]       [address] [place] [address]mail[address]       [place], [place]  [address]       Ph: [phone]       Fax: [phone]   RxID:   [id number]    SPIROMETRY RESULTS height inches: 62.5 Gender: Female  Parameter  Measured Predicted %Predicted  FVC              3.07      0  FEV1              2.51      0  FEV1/FVC              0.81      0  FEF25-75              2.72      0  PEF              394      0  Post-Bronchodilator  Parameter Measured Change from Baseline  FVC               FEV1               FEV1/FVC                FEF25-75                PEF               Pulse Oximetry Pulse: 70 O2 Saturation %:  98      Immunization History:  Influenza Immunization History:    Influenza:  historical (03/15/2009)

## 2010-10-28 NOTE — Assessment & Plan Note (Signed)
NAME:  Jacqueline Waters, Jacqueline Waters NO.:  [id number]   MEDICAL RECORD NO.:  [id number]          PATIENT TYPE:  POB   LOCATION:  [facility] at [facility]         FACILITY:  [facility]   PHYSICIAN:  Scheryl Darter, MD       DATE OF BIRTH:  19-Jan-1956   DATE OF SERVICE:                                  CLINIC NOTE   CHIEF COMPLAINT:  Need for physical exam.   The patient is a 55 year old black female gravida 3, para 3, last  menstrual period about 8 years ago when she had a hysterectomy for  menorrhagia and fibroids.  The patient is cared for by a physician at  [facility] for hypertension.  It has been several years since her last  pelvic exam.  She states that she never had any problems with Pap test.   PAST MEDICAL HISTORY:  1. Hypertension.  2. Breast cyst.   PAST SURGICAL HISTORY:  1. Total abdominal hysterectomy in 2002.  2. Colonoscopy was done last year and was normal.  3. Removal of a cyst on her left breast.   MEDICATIONS:  1. Micardis 80/12.5 one p.o. daily.  2. Lotrel 10/20 one p.o. daily.  3. Potassium 10 mEq 2 p.o. daily.  4. Cephadyn 50/650 one p.o. daily for headaches.  5. Calcium supplement, chewable.   ALLERGIES:  No known drug allergies or latex allergy.   FAMILY HISTORY:  Hypertension.  There is no family history of cancer.  She also denies any family history of osteoporosis.   PAST OBSTETRICAL HISTORY:  Three vaginal deliveries.  Last child was  delivered 23 years ago.   SOCIAL HISTORY:  The patient is married, and she denies alcohol,  tobacco, or drug use.   REVIEW OF SYSTEMS:  No complaints of severe headaches today or chest  pain or shortness breath or dysuria or urinary tract symptoms.   PHYSICAL EXAMINATION:  GENERAL:  The patient is in no acute distress  with normal affect.  VITAL SIGNS:  Weight 135 pounds, height 5 feet 2 inches, blood pressure  145/93, pulse 66.  CHEST:  Clear.  HEART:  Regular rate and rhythm.  No thyromegaly.  BREASTS:   Symmetric.  No mass, tenderness, or axillary lymphadenopathy.  ABDOMEN:  Flat, soft, nontender.  No masses.  EXTREMITIES:  No swelling.  PELVIC:  External genitalia, vagina.  Vaginal cuff showed good estrogen  effect and good support.  Pap was obtained from the cuff.  No pelvic  masses or tenderness.   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   PLAN:  I recommend she continue with a calcium supplement.  She has  never had screening bone density test, and we will order a DEXA scan.  She needs to have a screening mammogram as well.  Recommend increased  physical exercise and healthy lifestyle choices.  She will return for  yearly exams.      Scheryl Darter, MD     JA/MEDQ  D:  08/20/2009  T:  08/21/2009  Job:  [id number]

## 2010-10-31 NOTE — Assessment & Plan Note (Signed)
[facility]                                 ON-CALL NOTE   NAME:Jacqueline Waters, Jacqueline Waters                      MRN:          [id number]  DATE:06/19/2006                            DOB:          November 26, 1955    She called at 9:55 a.m. on June 19, 2006 complaining that her throat  was burning; it started last night.  She is coming in today to the  clinic.  No primary care physician was given, but she is a patient of  the [facility].     Lelon Perla, DO  Electronically Signed    Shawnie Dapper  DD: 06/19/2006  DT: 06/19/2006  Job #: [id number]

## 2010-11-03 ENCOUNTER — Other Ambulatory Visit (INDEPENDENT_AMBULATORY_CARE_PROVIDER_SITE_OTHER): Payer: 59

## 2010-11-03 ENCOUNTER — Encounter: Payer: Self-pay | Admitting: Adult Health

## 2010-11-03 ENCOUNTER — Ambulatory Visit (INDEPENDENT_AMBULATORY_CARE_PROVIDER_SITE_OTHER): Payer: 59 | Admitting: Adult Health

## 2010-11-03 VITALS — BP 140/82 | HR 70 | Temp 97.0°F | Ht <= 58 in | Wt 138.2 lb

## 2010-11-03 DIAGNOSIS — I10 Essential (primary) hypertension: Secondary | ICD-10-CM

## 2010-11-03 DIAGNOSIS — S161XXA Strain of muscle, fascia and tendon at neck level, initial encounter: Secondary | ICD-10-CM

## 2010-11-03 DIAGNOSIS — E7800 Pure hypercholesterolemia, unspecified: Secondary | ICD-10-CM

## 2010-11-03 DIAGNOSIS — R51 Headache: Secondary | ICD-10-CM

## 2010-11-03 DIAGNOSIS — S139XXA Sprain of joints and ligaments of unspecified parts of neck, initial encounter: Secondary | ICD-10-CM

## 2010-11-03 LAB — CBC WITH DIFFERENTIAL/PLATELET
Basophils Absolute: 0 10*3/uL (ref 0.0–0.1)
Basophils Relative: 0.6 % (ref 0.0–3.0)
Eosinophils Absolute: 0 10*3/uL (ref 0.0–0.7)
Eosinophils Relative: 0.6 % (ref 0.0–5.0)
HCT: 39.9 % (ref 36.0–46.0)
Hemoglobin: 14 g/dL (ref 12.0–15.0)
Lymphocytes Relative: 40.9 % (ref 12.0–46.0)
Lymphs Abs: 1.9 10*3/uL (ref 0.7–4.0)
MCHC: 35 g/dL (ref 30.0–36.0)
MCV: 88.1 fl (ref 78.0–100.0)
Monocytes Absolute: 0.5 10*3/uL (ref 0.1–1.0)
Monocytes Relative: 10 % (ref 3.0–12.0)
Neutro Abs: 2.3 10*3/uL (ref 1.4–7.7)
Neutrophils Relative %: 47.9 % (ref 43.0–77.0)
Platelets: 257 10*3/uL (ref 150.0–400.0)
RBC: 4.53 Mil/uL (ref 3.87–5.11)
RDW: 12.9 % (ref 11.5–14.6)
WBC: 4.8 10*3/uL (ref 4.5–10.5)

## 2010-11-03 LAB — BASIC METABOLIC PANEL
BUN: 16 mg/dL (ref 6–23)
CO2: 31 mEq/L (ref 19–32)
Calcium: 9.9 mg/dL (ref 8.4–10.5)
Chloride: 98 mEq/L (ref 96–112)
Creatinine, Ser: 0.7 mg/dL (ref 0.4–1.2)
GFR: 116.08 mL/min (ref >60.00)
Glucose, Bld: 100 mg/dL — ABNORMAL HIGH (ref 70–99)
Potassium: 2.8 mEq/L — CL (ref 3.5–5.1)
Sodium: 137 mEq/L (ref 135–145)

## 2010-11-03 LAB — TSH: TSH: 1.87 u[IU]/mL (ref 0.35–5.50)

## 2010-11-03 MED ORDER — POTASSIUM CHLORIDE 10 MEQ PO TBCR: 10.0000 meq | EXTENDED_RELEASE_TABLET | Freq: Every day | ORAL | Status: DC

## 2010-11-03 MED ORDER — TELMISARTAN-HCTZ 80-12.5 MG PO TABS: 1.0000 | ORAL_TABLET | Freq: Every day | ORAL | Status: DC

## 2010-11-03 MED ORDER — CYCLOBENZAPRINE HCL 5 MG PO TABS: 5.0000 mg | ORAL_TABLET | Freq: Three times a day (TID) | ORAL | Status: DC | PRN

## 2010-11-03 MED ORDER — AMLODIPINE BESY-BENAZEPRIL HCL 10-20 MG PO CAPS: 1.0000 | ORAL_CAPSULE | Freq: Every day | ORAL | Status: DC

## 2010-11-03 NOTE — Assessment & Plan Note (Addendum)
Continue on current regimen.  Low salt diet  Labs pending.

## 2010-11-03 NOTE — Progress Notes (Signed)
  Subjective:    Patient ID: Jacqueline Waters, female    DOB: June 27, 1955, 55 y.o.   MRN: [id number]  HPI 55 yo with known hx of HTN, and Hyperlipidemia   11/03/10 Follow up Pt returns for follow up and med refills.Says she is doing well except for neck soreness. Says she is walking regularly. Tolerating meds well.   She does a lot of computer work. NEck is sore a lot along both sides and upper back.  No otc used. No extremity weakness or radicular pain.   Her colonoscopy is UTD (2010). GYN ov is next month along with mammogram.    Review of Systems Constitutional:   No  weight loss, night sweats,  Fevers, chills, fatigue, or  lassitude.  HEENT:   No headaches,  Difficulty swallowing,  Tooth/dental problems, or  Sore throat,                No sneezing, itching, ear ache, nasal congestion, post nasal drip,   CV:  No chest pain,  Orthopnea, PND, swelling in lower extremities, anasarca, dizziness, palpitations, syncope.   GI  No heartburn, indigestion, abdominal pain, nausea, vomiting, diarrhea, change in bowel habits, loss of appetite, bloody stools.   Resp: No shortness of breath with exertion or at rest.  No excess mucus, no productive cough,  No non-productive cough,  No coughing up of blood.  No change in color of mucus.  No wheezing.  No chest wall deformity  Skin: no rash or lesions.  GU: no dysuria, change in color of urine, no urgency or frequency.  No flank pain, no hematuria   MS:  No joint pain or swelling.  No decreased range of motion. + upper back pain.and neck pain   Psych:  No change in mood or affect. No depression or anxiety.  No memory loss.         Objective:   Physical Exam GEN: A/Ox3; pleasant , NAD, well nourished   HEENT:  [place]/AT,  EACs-clear, TMs-wnl, NOSE-clear, THROAT-clear, no lesions, no postnasal drip or exudate noted.   NECK:  Supple w/ fair ROM; no JVD; normal carotid impulses w/o bruits; no thyromegaly or nodules palpated; no  lymphadenopathy.  RESP  Clear  P & A; w/o, wheezes/ rales/ or rhonchi.no accessory muscle use, no dullness to percussion  CARD:  RRR, no m/r/g  , no peripheral edema, pulses intact, no cyanosis or clubbing.  GI:   Soft & nt; nml bowel sounds; no organomegaly or masses detected.  Musco: Warm bil, no deformities or joint swelling noted.  Tender along upper back and neck.   Neuro: alert, no focal deficits noted.    Skin: Warm, no lesions or rashes         Assessment & Plan:

## 2010-11-03 NOTE — Assessment & Plan Note (Signed)
Alternate ice and heat Stretching exercises  Good posture /proper work Administrator, arts As needed

## 2010-11-03 NOTE — Patient Instructions (Signed)
Continue on current regimen.  Keep up good work.  Low salt diet, exercise as tolerated.  I will call with lab work.  follow up Dr. Kriste Basque  In 6 months

## 2010-11-03 NOTE — Assessment & Plan Note (Signed)
Diet and exercise discussed

## 2010-11-04 ENCOUNTER — Other Ambulatory Visit: Payer: Self-pay | Admitting: *Deleted

## 2010-11-04 MED ORDER — POTASSIUM CHLORIDE CRYS ER 20 MEQ PO TBCR: 20.0000 meq | EXTENDED_RELEASE_TABLET | Freq: Two times a day (BID) | ORAL | Status: DC

## 2010-11-07 ENCOUNTER — Ambulatory Visit: Payer: 59 | Admitting: Family Medicine

## 2010-11-17 ENCOUNTER — Other Ambulatory Visit: Payer: Self-pay | Admitting: Pulmonary Disease

## 2010-11-17 ENCOUNTER — Other Ambulatory Visit: Payer: 59

## 2010-11-17 DIAGNOSIS — E876 Hypokalemia: Secondary | ICD-10-CM

## 2010-12-01 ENCOUNTER — Other Ambulatory Visit: Payer: Self-pay | Admitting: Pulmonary Disease

## 2010-12-01 DIAGNOSIS — Z1231 Encounter for screening mammogram for malignant neoplasm of breast: Secondary | ICD-10-CM

## 2010-12-02 LAB — MIH FAX RESULT: FAX TO NUMBER: 8330062

## 2010-12-02 LAB — POTASSIUM: Potassium: 3.5 MMOL/L (ref 3.5–5.5)

## 2010-12-03 ENCOUNTER — Other Ambulatory Visit: Payer: Self-pay | Admitting: Family Medicine

## 2010-12-03 MED ORDER — ERYTHROMYCIN 5 MG/GM OP OINT: TOPICAL_OINTMENT | Freq: Four times a day (QID) | OPHTHALMIC | Status: AC

## 2010-12-04 ENCOUNTER — Ambulatory Visit: Payer: 59

## 2010-12-08 ENCOUNTER — Other Ambulatory Visit

## 2010-12-09 ENCOUNTER — Telehealth: Payer: Self-pay | Admitting: Pulmonary Disease

## 2010-12-09 MED ORDER — AMOXICILLIN-POT CLAVULANATE 875-125 MG PO TABS: 1.0000 | ORAL_TABLET | Freq: Two times a day (BID) | ORAL | Status: AC

## 2010-12-09 MED ORDER — GUAIFENESIN ER 600 MG PO TB12: ORAL_TABLET | ORAL | Status: AC

## 2010-12-09 MED ORDER — HYDROCOD POLST-CHLORPHEN POLST 10-8 MG/5ML PO LQCR: 5.0000 mL | Freq: Two times a day (BID) | ORAL | Status: DC | PRN

## 2010-12-09 NOTE — Telephone Encounter (Addendum)
Pt c/o cough with beige to yellow mucus in the mornings. Cough is dry during the day. Sore throat. She denies any fever or sob or wheezing. Pls advise.No Known Allergies

## 2010-12-09 NOTE — Telephone Encounter (Signed)
Pt aware of recs per SN and prescriptions have been called to [facility] in [place].

## 2010-12-09 NOTE — Telephone Encounter (Signed)
Per SN--ok for pt to have augmentin 875mg    #14   1 po bid and otc mucinex 600mg    1-2 tabs by mouth bid with plenty of fluids. Ok to give tussionex #4oz if cough persists.   1 tsp by mouth every 12 hours prn.

## 2010-12-10 ENCOUNTER — Ambulatory Visit
Admission: RE | Admit: 2010-12-10 | Discharge: 2010-12-10 | Disposition: A | Discharge status: Home / Self Care | Payer: 59 | Source: Ambulatory Visit | Attending: Pulmonary Disease | Admitting: Pulmonary Disease

## 2010-12-10 DIAGNOSIS — Z1231 Encounter for screening mammogram for malignant neoplasm of breast: Secondary | ICD-10-CM

## 2011-02-18 ENCOUNTER — Ambulatory Visit: Payer: 59 | Admitting: Family Medicine

## 2011-08-05 ENCOUNTER — Ambulatory Visit: Payer: 59 | Admitting: Obstetrics & Gynecology

## 2011-08-11 ENCOUNTER — Ambulatory Visit: Payer: 59 | Admitting: Obstetrics & Gynecology

## 2011-08-11 DIAGNOSIS — N951 Menopausal and female climacteric states: Secondary | ICD-10-CM

## 2011-08-24 ENCOUNTER — Ambulatory Visit (INDEPENDENT_AMBULATORY_CARE_PROVIDER_SITE_OTHER): Payer: 59 | Admitting: Adult Health

## 2011-08-24 ENCOUNTER — Encounter: Payer: Self-pay | Admitting: Adult Health

## 2011-08-24 ENCOUNTER — Ambulatory Visit (INDEPENDENT_AMBULATORY_CARE_PROVIDER_SITE_OTHER): Payer: 59 | Admitting: Obstetrics & Gynecology

## 2011-08-24 ENCOUNTER — Encounter: Payer: Self-pay | Admitting: Obstetrics & Gynecology

## 2011-08-24 VITALS — BP 174/95 | HR 80 | Ht 62.0 in | Wt 143.0 lb

## 2011-08-24 DIAGNOSIS — N951 Menopausal and female climacteric states: Secondary | ICD-10-CM

## 2011-08-24 DIAGNOSIS — R232 Flushing: Secondary | ICD-10-CM

## 2011-08-24 DIAGNOSIS — I10 Essential (primary) hypertension: Secondary | ICD-10-CM

## 2011-08-24 DIAGNOSIS — H6120 Impacted cerumen, unspecified ear: Secondary | ICD-10-CM

## 2011-08-24 MED ORDER — TELMISARTAN-HCTZ 80-12.5 MG PO TABS: 1.0000 | ORAL_TABLET | Freq: Every day | ORAL | Status: DC

## 2011-08-24 MED ORDER — POTASSIUM CHLORIDE CRYS ER 20 MEQ PO TBCR: 20.0000 meq | EXTENDED_RELEASE_TABLET | Freq: Two times a day (BID) | ORAL | Status: DC

## 2011-08-24 MED ORDER — ESTRADIOL 1 MG PO TABS: 1.0000 mg | ORAL_TABLET | Freq: Every day | ORAL | Status: DC

## 2011-08-24 MED ORDER — AMLODIPINE BESY-BENAZEPRIL HCL 10-20 MG PO CAPS: 1.0000 | ORAL_CAPSULE | Freq: Every day | ORAL | Status: DC

## 2011-08-24 MED ORDER — BUTALBITAL-ACETAMINOPHEN 50-650 MG PO TABS: ORAL_TABLET | ORAL | Status: DC

## 2011-08-24 NOTE — Progress Notes (Signed)
  Subjective:    Patient ID: Jacqueline Waters, female    DOB: 05-Dec-1955, 56 y.o.   MRN: [id number]  HPI  56 yo with known hx of HTN, and Hyperlipidemia   11/03/10 Follow up Pt returns for follow up and med refills.Says she is doing well except for neck soreness. Says she is walking regularly. Tolerating meds well.   She does a lot of computer work. NEck is sore a lot along both sides and upper back.  No otc used. No extremity weakness or radicular pain.   Her colonoscopy is UTD (2010). GYN ov is next month along with mammogram.   08/24/2011 Acute OV  Complains of right ear discomfort > feels "like something is in it" from time to time. Husband thinks her hearing is diminished. She denies hearing changes, no difficulty hearing crowds , on phone , etc.  No ear pain or fever . No drainage.  B/P is elevated - took meds late in day today.  Is overdue for physical , advised needs follow up for CPX -pt aware    Review of Systems  Constitutional:   No  weight loss, night sweats,  Fevers, chills, fatigue, or  lassitude.  HEENT:   No headaches,  Difficulty swallowing,  Tooth/dental problems, or  Sore throat,                No sneezing, itching, ear ache, nasal congestion, post nasal drip,   CV:  No chest pain,  Orthopnea, PND, swelling in lower extremities, anasarca, dizziness, palpitations, syncope.   GI  No heartburn, indigestion, abdominal pain, nausea, vomiting, diarrhea, change in bowel habits, loss of appetite, bloody stools.   Resp: No shortness of breath with exertion or at rest.  No excess mucus, no productive cough,  No non-productive cough,  No coughing up of blood.  No change in color of mucus.  No wheezing.  No chest wall deformity  Skin: no rash or lesions.  GU: no dysuria, change in color of urine, no urgency or frequency.  No flank pain, no hematuria   MS:  No joint pain or swelling.  No decreased range of motion. + upper back pain.and neck pain   Psych:  No change in mood  or affect. No depression or anxiety.  No memory loss.         Objective:   Physical Exam  GEN: A/Ox3; pleasant , NAD, well nourished   HEENT:  [place]/AT,  EACs-right partial cerumen impaction  TMs-wnl, NOSE-clear, THROAT-clear, no lesions, no postnasal drip or exudate noted.   NECK:  Supple w/ fair ROM; no JVD; normal carotid impulses w/o bruits; no thyromegaly or nodules palpated; no lymphadenopathy.  RESP  Clear  P & A; w/o, wheezes/ rales/ or rhonchi.no accessory muscle use, no dullness to percussion  CARD:  RRR, no m/r/g  , no peripheral edema, pulses intact, no cyanosis or clubbing.  GI:   Soft & nt; nml bowel sounds; no organomegaly or masses detected.  Musco: Warm bil, no deformities or joint swelling noted.  Tender along upper back and neck.   Neuro: alert, no focal deficits noted.    Skin: Warm, no lesions or rashes         Assessment & Plan:

## 2011-08-24 NOTE — Assessment & Plan Note (Signed)
Right cerumen impaction s/p ear irrigation with success. Tolerated procedure well with no obvious difficulty.  May use debrox  As needed

## 2011-08-24 NOTE — Assessment & Plan Note (Addendum)
Elevated today, advised on low salt diet and return for physical. -fasting labs

## 2011-08-24 NOTE — Progress Notes (Signed)
  Subjective:    Patient ID: Jacqueline Waters, female    DOB: 02/20/1956, 56 y.o.   MRN: [id number]  HPI  Jacqueline Waters comes in with the complaint of 1 year of hot flashes that are worsening. She would like medication.   Review of Systems     Objective:   Physical Exam        Assessment & Plan:  I have reviewed her treatment options including Estrogen and SSRIs. She prefers the estrogen approach. I will try a generic mid range dose and reevaluate her in a month.

## 2011-08-24 NOTE — Patient Instructions (Addendum)
May use debrox As needed  Ear wax  Claritin daily As needed  Drainage or sinus congestion  Please contact office for sooner follow up if symptoms do not improve or worsen or seek emergency care  follow up Dr. Kriste Basque  In 1 month for physical

## 2011-08-31 ENCOUNTER — Encounter: Payer: 59 | Admitting: Obstetrics & Gynecology

## 2011-09-17 ENCOUNTER — Ambulatory Visit: Payer: 59 | Admitting: Obstetrics & Gynecology

## 2011-10-20 ENCOUNTER — Ambulatory Visit (INDEPENDENT_AMBULATORY_CARE_PROVIDER_SITE_OTHER): Payer: 59 | Admitting: Pulmonary Disease

## 2011-10-20 ENCOUNTER — Encounter: Payer: Self-pay | Admitting: Pulmonary Disease

## 2011-10-20 VITALS — BP 134/82 | HR 78 | Temp 96.9°F | Ht 62.0 in | Wt 146.8 lb

## 2011-10-20 DIAGNOSIS — E559 Vitamin D deficiency, unspecified: Secondary | ICD-10-CM

## 2011-10-20 DIAGNOSIS — M545 Low back pain, unspecified: Secondary | ICD-10-CM

## 2011-10-20 DIAGNOSIS — R51 Headache: Secondary | ICD-10-CM

## 2011-10-20 DIAGNOSIS — Z0000 Encounter for general adult medical examination without abnormal findings: Secondary | ICD-10-CM

## 2011-10-20 DIAGNOSIS — I10 Essential (primary) hypertension: Secondary | ICD-10-CM

## 2011-10-20 DIAGNOSIS — E7800 Pure hypercholesterolemia, unspecified: Secondary | ICD-10-CM

## 2011-10-20 MED ORDER — CYCLOBENZAPRINE HCL 5 MG PO TABS: 5.0000 mg | ORAL_TABLET | Freq: Three times a day (TID) | ORAL | Status: AC | PRN

## 2011-10-20 MED ORDER — TELMISARTAN-HCTZ 80-12.5 MG PO TABS: 1.0000 | ORAL_TABLET | Freq: Every day | ORAL | Status: DC

## 2011-10-20 MED ORDER — POTASSIUM CHLORIDE CRYS ER 20 MEQ PO TBCR: 20.0000 meq | EXTENDED_RELEASE_TABLET | Freq: Two times a day (BID) | ORAL | Status: DC

## 2011-10-20 MED ORDER — AMLODIPINE BESY-BENAZEPRIL HCL 10-20 MG PO CAPS: 1.0000 | ORAL_CAPSULE | Freq: Every day | ORAL | Status: DC

## 2011-10-20 NOTE — Patient Instructions (Signed)
Today we updated your med list in our EPIC system...    Continue your current medications the same...  We refilled your meds per request...  Please return to our lab one morning soon for your FASTING blood work...    We will call you w/ the results when avail...  Let me know if the Butalbital/Acetomenaphen 50/650 tabs are not avail & we will pick a diff med for your headaches...  Keep up the good work w/ diet & exercise.Marland KitchenMarland Kitchen

## 2011-10-20 NOTE — Progress Notes (Addendum)
Subjective:     Patient ID: Jacqueline Waters, female   DOB: 1956/06/05, 56 y.o.   MRN: [id number]  HPI 56 y/o BF here for a follow up visit and physical exam...   ~  June 29, 2007:  Jacqueline Waters looks great, is doing well and has no specific complaints today... 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 See problem list below>  ~  Oct 20, 2011:  >59yr ROV & CPX... Jacqueline Waters spent 87yrs at [employer], then 100yrs at [facility], and is now working for the [employer] group... She continues to do well overall w/o new complaints or concerns...  We reviewed her prob list, meds, prev XRays & Labs; she will ret for FASTING blood work>> LABS 5/13:  FLP- Chol not at goals w/ LDL=167;  Chems- wnl;  CBC- wnl;  TSH=4.89;  VitD=26;  UA=clear   Problem List:    << PROBLEM LIST UPDATED 10/20/11 >>  HYPERTENSION (ICD-401.9) - controlled on LOTREL 10-20 & MICARDIS/HCT 80-12.5.Jacqueline KitchenMarland Waters ~  1/09:  BP= 126/64 & denies HA, fatigue, visual changes, CP, palipit, dizziness, syncope, dyspnea, edema, etc... ~  CXR 1/09 showed normal heart size 7 clear lungs, WNL.Jacqueline Waters. ~  5/13:  BP= 134/82 & she remains asymptomatic w/o CP, palpit, SOB, edema, etc...  Family Hx of CAD (ICD-414.00) - she had a Cardiolite in 2001= normal without ischemia and EF=73%... ~  5/13:  She is active, exercises by walking & running, no CP or exertional symptoms...  HYPERCHOLESTEROLEMIA (ICD-272.0) - she has not been on medication, trying to control w/ diet alone...  ~  FLP 5/07 showed TChol 190, TG 40, HDL 44, LDL138... her LDL's have been betw 124-168 for years and we discussed starting a statin med if needed. ~  FLP 1/09 showed TChol 224, TG 67, HDL 42, LDL 167... Needs better diet efforts. ~  FLP 1/11 showed TChol 195, TG 48, HDL 48, LDL 138 ~  FLP 5/13 showed TChol 218, TG 57, HDL 57, LDL 167... TIME TO START RX- rec ATORVA20.  HYSTERECTOMY, HX OF (ICD-V45.77) ~  She is  followed for GYN by [name] in [facility]...  DEGENERATIVE DISC DISEASE (ICD-722.6) BACK PAIN, LUMBAR (ICD-724.2) - she uses OTC analgesics as needed... ~  5/13:  She notes that back has been doing well, able to exercise regularly w/o pain...  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 ~  Labs 1/11 showed Vit D level = 10... Given Vit D 50K weekly for several months... ~  Labs 5/11 showed Vit D level = 35 & she was switched to 1000u OTC daily... ~  Labs 5/13 showed Vit D level = 26 but she hasn't been taking the supplement; rec incr to 2000u/d to catch up...  HEADACHE, MIXED (ICD-784.0) - responded well to CEPHADYN 50-650 (Butalbital-Acetomenophen) perscribed by [name] in the past; she would like to continue this med but when she tried to fill it the co-pay was ?Tier3?  We re-wrote the Rx in generic form & she will check w/ Pharm...  HEALTH MAINTENANCE: ~  GI:  She sees [name] & had a screening colonoscopy 4/10 which was neg- no divertics, polyps, hems, etc; f/u planned 10 yrs. ~  GYN:  [name] in [place]; had PAP in 2012 but doesn't need yearly due to [employer]; she gets Mammograms at [facility] & last= 6/12 and neg; She doesn't recall prev BMD & we will sched  one here for baseline... ~  Immunizations:  She is up to date on required vaccines thru [facility] employee health...   Past Surgical History  Procedure Date  . Vesicovaginal fistula closure w/ tah   . Cystectomy     left breast     Outpatient Encounter Prescriptions as of 10/20/2011  Medication Sig Dispense Refill  . amLODipine-benazepril (LOTREL) 10-20 MG per capsule Take 1 capsule by mouth daily.  90 capsule  3  . Cholecalciferol (VITAMIN D) 2000 UNITS capsule Take 2,000 Units by mouth daily.    Increased to 2000u/d 5/13...    . cyclobenzaprine (FLEXERIL) 5 MG tablet Take 1 tablet (5 mg total) by mouth every 8 (eight) hours as needed for muscle spasms.  100 tablet  5  . estradiol (ESTRACE) 1 MG tablet Take  1 tablet (1 mg total) by mouth daily.  31 tablet  12  . guaiFENesin (MUCINEX) 600 MG 12 hr tablet 1-2 TABLETS TWICE DAILY AS NEEDED  60 tablet  0  . potassium chloride SA (KLOR-CON M20) 20 MEQ tablet Take 1 tablet (20 mEq total) by mouth 2 (two) times daily.  180 tablet  3  . telmisartan-hydrochlorothiazide (MICARDIS HCT) 80-12.5 MG per tablet Take 1 tablet by mouth daily.  90 tablet  3  .  ACETAMINOPHEN-BUTALBITAL (CEPHADYN) 50-650 MG TABS 1 tab by mouth every 6-8 hours as needed for headaches.  She mentioned co-pay was too high...  we will try to fill again    ATORVASTATIN 20mg   Take 1 tab daily... Added 5/13...     No Known Allergies   Current Medications, Allergies, Past Medical History, Past Surgical History, Family History, and Social History were reviewed in [employer] record.   Review of Systems    The patient denies fever, chills, sweats, anorexia, fatigue, weakness, malaise, weight loss, sleep disorder, blurring, diplopia, eye irritation, eye discharge, vision loss, eye pain, photophobia, earache, ear discharge, tinnitus, decreased hearing, nasal congestion, nosebleeds, sore throat, hoarseness, chest pain, palpitations, syncope, dyspnea on exertion, orthopnea, PND, peripheral edema, cough, dyspnea at rest, excessive sputum, hemoptysis, wheezing, pleurisy, nausea, vomiting, diarrhea, constipation, change in bowel habits, abdominal pain, melena, hematochezia, jaundice, gas/bloating, indigestion/heartburn, dysphagia, odynophagia, dysuria, hematuria, urinary frequency, urinary hesitancy, nocturia, incontinence, back pain, joint pain, joint swelling, muscle cramps, muscle weakness, stiffness, arthritis, sciatica, restless legs, leg pain at night, leg pain with exertion, rash, itching, dryness, suspicious lesions, paralysis, paresthesias, seizures, tremors, vertigo, transient blindness, frequent falls, frequent headaches, difficulty walking, depression, anxiety, memory  loss, confusion, cold intolerance, heat intolerance, polydipsia, polyphagia, polyuria, unusual weight change, abnormal bruising, bleeding, enlarged lymph nodes, urticaria, allergic rash, hay fever, and recurrent infections.     Objective:   Physical Exam    WD, WN, 55 y/o BF in NAD... GENERAL:  Alert & oriented; pleasant & cooperative. HEENT:  [place]/AT, EOM-wnl, PERRLA, Fundi-benign, EACs-clear, TMs-wnl, NOSE-clear, THROAT-clear & wnl. NECK:  Supple w/ full ROM; no JVD; normal carotid impulses w/o bruits; no thyromegaly or nodules palpated; no lymphadenopathy. CHEST:  Clear to P & A; without wheezes/ rales/ or rhonchi. HEART:  Regular Rhythm; without murmurs/ rubs/ or gallops. ABDOMEN:  Soft & nontender; normal bowel sounds; no organomegaly or masses detected. EXT: without deformities or arthritic changes; no varicose veins/ venous insuffic/ or edema. NEURO:  CN's intact; motor testing normal; sensory testing normal; gait normal & balance OK. DERM:  No lesions noted; no rash etc...  RADIOLOGY DATA:  Reviewed in the EPIC EMR & discussed w/ the patient.Jacqueline KitchenMarland Waters  LABORATORY DATA:  Reviewed in the EPIC EMR & discussed w/ the patient...   Assessment:     CPX>>  FLP ret w/ LDL 167 (Rec to start Atorva20);  Vit D ret low & OTC supplement incr to 2000u daily...  HBP>  Controlled on Lotrel & MicardisHCT; discussed low sodium etc...  Fam Hx CAD>  Aware, she has no exertional chest symptoms & has been exercising regularly...  CHOL>  Trying to control w/ diet & exercise but I have been in favor of Statin Rx for some time==> LDL STILL 167- rec to start Lip20.  DDD/ LBP>  She denies recent LBP or difficulty w/ her exercise program...  Vit D Defic>  She has been off the Vit D 1000u OTC supplement & will restart this Rx==> rec incr to 2000u/d for now. ~  5/13:  She has never had a baseline BMD & we will sched this for her...  Hx HAs>  She doesn't need the Butalbital often but likes to have them on hand;  we will check for lower co-pay generic product...     Plan:     Patient's Medications  New Prescriptions       REC:  Add  ATORVASTATIN 20mg  -  One tab daily...  Previous Medications   CHOLECALCIFEROL (VITAMIN D) 2000 UNITS CAPSULE    Take 2,000 Units by mouth daily.     ESTRADIOL (ESTRACE) 1 MG TABLET    Take 1 tablet (1 mg total) by mouth daily.   GUAIFENESIN (MUCINEX) 600 MG 12 HR TABLET    1-2 TABLETS TWICE DAILY AS NEEDED  Modified Medications   Modified Medication Previous Medication   AMLODIPINE-BENAZEPRIL (LOTREL) 10-20 MG PER CAPSULE amLODipine-benazepril (LOTREL) 10-20 MG per capsule      Take 1 capsule by mouth daily.    Take 1 capsule by mouth daily.   CYCLOBENZAPRINE (FLEXERIL) 5 MG TABLET cyclobenzaprine (FLEXERIL) 5 MG tablet      Take 1 tablet (5 mg total) by mouth every 8 (eight) hours as needed for muscle spasms.    Take 1 tablet (5 mg total) by mouth every 8 (eight) hours as needed for muscle spasms.   POTASSIUM CHLORIDE SA (KLOR-CON M20) 20 MEQ TABLET potassium chloride SA (KLOR-CON M20) 20 MEQ tablet      Take 1 tablet (20 mEq total) by mouth 2 (two) times daily.    Take 1 tablet (20 mEq total) by mouth 2 (two) times daily.   TELMISARTAN-HYDROCHLOROTHIAZIDE (MICARDIS HCT) 80-12.5 MG PER TABLET telmisartan-hydrochlorothiazide (MICARDIS HCT) 80-12.5 MG per tablet      Take 1 tablet by mouth daily.    Take 1 tablet by mouth daily.  Discontinued Medications   ACETAMINOPHEN-BUTALBITAL (CEPHADYN) 50-650 MG TABS   This was refilled at her request to see if insurance will cover for a reasonable co-pay.Jacqueline KitchenMarland Waters

## 2011-10-28 ENCOUNTER — Other Ambulatory Visit (INDEPENDENT_AMBULATORY_CARE_PROVIDER_SITE_OTHER): Payer: 59

## 2011-10-28 DIAGNOSIS — Z0000 Encounter for general adult medical examination without abnormal findings: Secondary | ICD-10-CM

## 2011-10-28 LAB — CBC WITH DIFFERENTIAL/PLATELET
Basophils Absolute: 0 10*3/uL (ref 0.0–0.1)
Basophils Relative: 0.4 % (ref 0.0–3.0)
Eosinophils Absolute: 0 10*3/uL (ref 0.0–0.7)
Eosinophils Relative: 0.8 % (ref 0.0–5.0)
HCT: 39 % (ref 36.0–46.0)
Hemoglobin: 13.5 g/dL (ref 12.0–15.0)
Lymphocytes Relative: 42.4 % (ref 12.0–46.0)
Lymphs Abs: 1.7 10*3/uL (ref 0.7–4.0)
MCHC: 34.6 g/dL (ref 30.0–36.0)
MCV: 86 fl (ref 78.0–100.0)
Monocytes Absolute: 0.4 10*3/uL (ref 0.1–1.0)
Monocytes Relative: 10.9 % (ref 3.0–12.0)
Neutro Abs: 1.9 10*3/uL (ref 1.4–7.7)
Neutrophils Relative %: 45.5 % (ref 43.0–77.0)
Platelets: 276 10*3/uL (ref 150.0–400.0)
RBC: 4.53 Mil/uL (ref 3.87–5.11)
RDW: 13.6 % (ref 11.5–14.6)
WBC: 4.1 10*3/uL — ABNORMAL LOW (ref 4.5–10.5)

## 2011-10-28 LAB — URINALYSIS
Bilirubin Urine: NEGATIVE
Hgb urine dipstick: NEGATIVE
Ketones, ur: NEGATIVE
Leukocytes, UA: NEGATIVE
Nitrite: NEGATIVE
Specific Gravity, Urine: 1.02 (ref 1.000–1.030)
Total Protein, Urine: NEGATIVE
Urine Glucose: NEGATIVE
Urobilinogen, UA: 0.2 (ref 0.0–1.0)
pH: 6 (ref 5.0–8.0)

## 2011-10-28 LAB — LIPID PANEL
Cholesterol: 218 mg/dL — ABNORMAL HIGH (ref 0–200)
HDL: 56.9 mg/dL (ref >39.00)
Total CHOL/HDL Ratio: 4
Triglycerides: 57 mg/dL (ref 0.0–149.0)
VLDL: 11.4 mg/dL (ref 0.0–40.0)

## 2011-10-28 LAB — BASIC METABOLIC PANEL
BUN: 16 mg/dL (ref 6–23)
CO2: 29 mEq/L (ref 19–32)
Calcium: 9.4 mg/dL (ref 8.4–10.5)
Chloride: 105 mEq/L (ref 96–112)
Creatinine, Ser: 0.8 mg/dL (ref 0.4–1.2)
GFR: 97.28 mL/min (ref >60.00)
Glucose, Bld: 86 mg/dL (ref 70–99)
Potassium: 3.5 mEq/L (ref 3.5–5.1)
Sodium: 140 mEq/L (ref 135–145)

## 2011-10-28 LAB — HEPATIC FUNCTION PANEL
ALT: 17 U/L (ref 0–35)
AST: 18 U/L (ref 0–37)
Albumin: 3.9 g/dL (ref 3.5–5.2)
Alkaline Phosphatase: 58 U/L (ref 39–117)
Bilirubin, Direct: 0 mg/dL (ref 0.0–0.3)
Total Bilirubin: 0.1 mg/dL — ABNORMAL LOW (ref 0.3–1.2)
Total Protein: 7.4 g/dL (ref 6.0–8.3)

## 2011-10-28 LAB — TSH: TSH: 4.89 u[IU]/mL (ref 0.35–5.50)

## 2011-10-28 LAB — LDL CHOLESTEROL, DIRECT: Direct LDL: 166.5 mg/dL

## 2011-10-29 LAB — VITAMIN D 25 HYDROXY (VIT D DEFICIENCY, FRACTURES): Vit D, 25-Hydroxy: 26 ng/mL — ABNORMAL LOW (ref 30–89)

## 2011-10-30 ENCOUNTER — Telehealth: Payer: Self-pay | Admitting: Pulmonary Disease

## 2011-10-30 MED ORDER — ATORVASTATIN CALCIUM 20 MG PO TABS: 20.0000 mg | ORAL_TABLET | Freq: Every day | ORAL | Status: DC

## 2011-10-30 NOTE — Telephone Encounter (Signed)
Notes Recorded by Michele Mcalpine, MD on 10/29/2011 at 9:27 AM Please notify patient> FLP is still way off & needs med Rx; Labs otherw OK x Vit D sl low... FLP showed LDL=167 (same as 32yrs ago) & needs med rx> rec- start ATORVASTATIN 20mg /d #90 w/ refills & rechecks labs in 16mo. Chems/ CBC/ Thyroid> All WNL.Marland KitchenMarland Kitchen Vit D is low at 26> she been off her supplement but needs to catch-up w/ 2000u OTC supplement daily for now...                  Pt is aware of her labs; Rx sent to [facility]. Will forward message to Leigh to make sure if SN would like to recheck FLP only or FLP and Vitamin D in 3 months. Pt aware we will call her once labs are in system.

## 2011-11-02 NOTE — Telephone Encounter (Signed)
Pt will need lipid and hepat in 3 months.  Will not need follow up vit d recheck until next year.  Stay on the vit d 2000 daily.  Have her call prior to having these labs done so we can put these in the computer.  thanks

## 2011-11-02 NOTE — Telephone Encounter (Signed)
LMTCB

## 2011-11-03 NOTE — Telephone Encounter (Signed)
lmomtcb x2 for pt 

## 2011-11-03 NOTE — Telephone Encounter (Signed)
Patient returning call.

## 2011-11-03 NOTE — Telephone Encounter (Signed)
lmomtcb x1 for pt 

## 2011-11-04 NOTE — Telephone Encounter (Signed)
Called # provided above - lm on [facility] named VM TCB

## 2011-11-05 NOTE — Telephone Encounter (Signed)
Returning call can be reached at [phone].Jacqueline Waters

## 2011-11-05 NOTE — Telephone Encounter (Signed)
Spoke with pt and notified of recs per [facility] regarding labs. She states that she is already aware of this and denied any questions regarding labs.  She does state thinks that she has vaginal yeast infection and would like something called in. Has not been on any recent abx. Having vaginal itching. She has GYN but states wants SN to call in something for this. Please advise, thanks!

## 2011-11-05 NOTE — Telephone Encounter (Signed)
lmomtcb x1 

## 2011-11-05 NOTE — Telephone Encounter (Signed)
LMTCBx2. Chaquita Basques, CMA  

## 2011-11-06 MED ORDER — FLUCONAZOLE 100 MG PO TABS: ORAL_TABLET | ORAL | Status: DC

## 2011-11-06 NOTE — Telephone Encounter (Signed)
Pt has returned triage's call & asked to be reached at [phone].  Jacqueline Waters

## 2011-11-06 NOTE — Telephone Encounter (Signed)
Pt aware of SN recs. Rx has been sent

## 2011-11-06 NOTE — Telephone Encounter (Signed)
Per SN---ok to send in diflucan 100mg   #5   2 po now and then 1 daily until gone.  thanks

## 2011-11-06 NOTE — Telephone Encounter (Signed)
lmomtcb x1 

## 2011-11-18 ENCOUNTER — Telehealth: Payer: Self-pay | Admitting: Pulmonary Disease

## 2011-11-18 NOTE — Telephone Encounter (Signed)
lmomtcb x1 

## 2011-11-18 NOTE — Telephone Encounter (Signed)
Per SN---already on diuretic in the micardis hct----no salt in diet, elevate the feet when she can and wear support hose when possible.  If she wants to start on diuretic we can stop the micardis hct and call in lasix 20mg  every morning and watch her BP and keep a record and call for any problems.  thanks

## 2011-11-18 NOTE — Telephone Encounter (Signed)
I spoke with pt and she c/o bilateral swelling in her feet x 2 weeks. Left foot is more swollen. It is not painful, hot, or red. She does not intake much much salt and elevates her feet when she can. When she does elevate her feet the swelling goes down some. Pt is requesting recs from SN. Please advise thanks     --When calling pt back she states press 0 and ask to speak with her instead of dialing her ext #

## 2011-11-18 NOTE — Telephone Encounter (Signed)
I called work # and received message the office was closed and could not dial ext. I called home # and had to [facility]

## 2011-11-18 NOTE — Telephone Encounter (Signed)
Pt returned triage's call.  Jacqueline Waters ° °

## 2011-11-19 NOTE — Telephone Encounter (Signed)
Pt returned triage's call.  Holly D Pryor ° °

## 2011-11-19 NOTE — Telephone Encounter (Signed)
I spoke with pt and advised her of SN recs. She keep stating it was her BP causing her feet to swell and i advised her that is not what SN was stating--in order to start her on lasix she would need to stop the micardis w/ HCT in order to start lasix. Pt was wanting to know what is causing her swelling and I advised her it could be many reasons but to do what SN recommended to see if it helps with the swelling. Pt then asked if we would ask SN if he knows what is causing her swelling. Please advise SN thanks

## 2011-11-19 NOTE — Telephone Encounter (Signed)
LMTCB x2  

## 2011-11-19 NOTE — Telephone Encounter (Signed)
lmomtcb for pt to discuss SN recs.  Per SN--this swelling comes from a combination of--too much salt in diet, and venous insufficiency (vein disease) and the treatment is  1.  No salt 2.  Elevate her legs 3.  Support hose 4.  Diuretic--the lasix 20mg  daily  If not responding we will be happy to recheck her or send her to cardiology/pv for further eval(Dr. Excell Seltzer).

## 2011-11-20 NOTE — Telephone Encounter (Signed)
lmomtcb  

## 2011-11-23 MED ORDER — FUROSEMIDE 20 MG PO TABS: 20.0000 mg | ORAL_TABLET | Freq: Every day | ORAL | Status: DC

## 2011-11-23 NOTE — Telephone Encounter (Signed)
Pt returning call. Please cb at [phone] ask for her and please don't leave message

## 2011-11-23 NOTE — Telephone Encounter (Signed)
Spoke with pt and explained to the pt what SN wanted to try for the swelling.  Pt is aware that we will hold the micardis/hct for now and start on the lasix 20mg  daily.  Pt voiced her understanding of this.  Pt is to call for any problems on the lasix and pt is aware to cont the lotrel.  Pt will call back for any problems or concerns.

## 2011-11-27 ENCOUNTER — Other Ambulatory Visit: Payer: Self-pay | Admitting: Pulmonary Disease

## 2011-11-27 DIAGNOSIS — Z1231 Encounter for screening mammogram for malignant neoplasm of breast: Secondary | ICD-10-CM

## 2011-12-11 ENCOUNTER — Ambulatory Visit: Payer: 59

## 2011-12-28 ENCOUNTER — Ambulatory Visit (INDEPENDENT_AMBULATORY_CARE_PROVIDER_SITE_OTHER): Payer: 59 | Admitting: Adult Health

## 2011-12-28 ENCOUNTER — Encounter: Payer: Self-pay | Admitting: Adult Health

## 2011-12-28 ENCOUNTER — Other Ambulatory Visit (INDEPENDENT_AMBULATORY_CARE_PROVIDER_SITE_OTHER): Payer: 59

## 2011-12-28 VITALS — BP 142/88 | HR 88 | Temp 96.8°F | Ht 62.0 in | Wt 148.2 lb

## 2011-12-28 DIAGNOSIS — I10 Essential (primary) hypertension: Secondary | ICD-10-CM

## 2011-12-28 DIAGNOSIS — R609 Edema, unspecified: Secondary | ICD-10-CM

## 2011-12-28 LAB — BASIC METABOLIC PANEL
BUN: 15 mg/dL (ref 6–23)
CO2: 28 mEq/L (ref 19–32)
Calcium: 9.4 mg/dL (ref 8.4–10.5)
Chloride: 100 mEq/L (ref 96–112)
Creatinine, Ser: 0.8 mg/dL (ref 0.4–1.2)
GFR: 100.14 mL/min (ref >60.00)
Glucose, Bld: 91 mg/dL (ref 70–99)
Potassium: 3.3 mEq/L — ABNORMAL LOW (ref 3.5–5.1)
Sodium: 137 mEq/L (ref 135–145)

## 2011-12-28 MED ORDER — BISOPROLOL FUMARATE 5 MG PO TABS: 5.0000 mg | ORAL_TABLET | Freq: Every day | ORAL | Status: DC

## 2011-12-28 NOTE — Progress Notes (Signed)
Subjective:     Patient ID: Jacqueline Waters, female   DOB: Sep 08, 1955, 56 y.o.   MRN: [id number]  HPI  56 y/o BF    ~  June 29, 2007:  Jacqueline Waters looks great, is doing well and has no specific complaints today... 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 See problem list below>  ~  Oct 20, 2011:  >56yr ROV & CPX... Jacqueline Waters spent 44yrs at [employer], then 15yrs at [employer], and is now working for the [employer] group... She continues to do well overall w/o new complaints or concerns...  We reviewed her prob list, meds, prev XRays & Labs; she will ret for FASTING blood work>> LABS 5/13:  FLP- Chol not at goals w/ LDL=167;  Chems- wnl;  CBC- wnl;  TSH=4.89;  VitD=26;  UA=clear  12/28/11 Acute OV  Presents for a work in visit  Complains of  bilateral LE edema x1 month - micardis-hct on hold, is now on lasix 20mg  Ankles are puffy in pm , better in am.  No calf pain .  Feet and ankles tight .  Was told to hold micardis hct and begin lasix 20mg   Has not noticed much change in ankle edema.  B/p tr in 140s off micardis  On Lotrel for b/p .  No chest pain or orthopnea.  No Nsaid use or decongestants      Problem List:    << PROBLEM LIST UPDATED 10/20/11 >>  HYPERTENSION (ICD-401.9)  ~  1/09:  BP= 126/64 & denies HA, fatigue, visual changes, CP, palipit, dizziness, syncope, dyspnea, edema, etc... ~  CXR 1/09 showed normal heart size 7 clear lungs, WNL.[name]. ~  5/13:  BP= 134/82 & she remains asymptomatic w/o CP, palpit, SOB, edema, etc...  Family Hx of CAD (ICD-414.00) - she had a Cardiolite in 2001= normal without ischemia and EF=73%... ~  5/13:  She is active, exercises by walking & running, no CP or exertional symptoms... ~11/2011 Stop Micardis , rx lasix  12/28/2011 Bisoprolol 5mg  daily rx   HYPERCHOLESTEROLEMIA (ICD-272.0) - she has not been on medication, trying to control w/ diet alone...  ~  FLP  5/07 showed TChol 190, TG 40, HDL 44, LDL138... her LDL's have been betw 124-168 for years and we discussed starting a statin med if needed. ~  FLP 1/09 showed TChol 224, TG 67, HDL 42, LDL 167... Needs better diet efforts. ~  FLP 1/11 showed TChol 195, TG 48, HDL 48, LDL 138 ~  FLP 5/13 showed TChol 218, TG 57, HDL 57, LDL 167... TIME TO START RX- rec ATORVA20.  HYSTERECTOMY, HX OF (ICD-V45.77) ~  She is followed for GYN by [name] in [facility]...  DEGENERATIVE DISC DISEASE (ICD-722.6) BACK PAIN, LUMBAR (ICD-724.2) - she uses OTC analgesics as needed... ~  5/13:  She notes that back has been doing well, able to exercise regularly w/o pain...  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 ~  Labs 1/11 showed Vit D level = 10... Given Vit D 50K weekly for several months... ~  Labs 5/11 showed Vit D level = 35 & she was switched to 1000u OTC daily... ~  Labs 5/13 showed Vit D level = 26 but she hasn't been taking the supplement; rec incr to 2000u/d to catch up...  HEADACHE, MIXED (ICD-784.0) - responded well to CEPHADYN 50-650 (Butalbital-Acetomenophen) perscribed by [name] in the past; she would like  to continue this med but when she tried to fill it the co-pay was ?Tier3?  We re-wrote the Rx in generic form & she will check w/ Pharm...  HEALTH MAINTENANCE: ~  GI:  She sees [name] & had a screening colonoscopy 4/10 which was neg- no divertics, polyps, hems, etc; f/u planned 10 yrs. ~  GYN:  [name] in [place]; had PAP in 2012 but doesn't need yearly due to [employer]; she gets Mammograms at [facility] & last= 6/12 and neg; She doesn't recall prev BMD & we will sched one here for baseline... ~  Immunizations:  She is up to date on required vaccines thru [facility] employee health...   Past Surgical History  Procedure Date  . Vesicovaginal fistula closure w/ tah   . Cystectomy     left breast     Outpatient Encounter Prescriptions as of 10/20/2011  Medication Sig  Dispense Refill  . amLODipine-benazepril (LOTREL) 10-20 MG per capsule Take 1 capsule by mouth daily.  90 capsule  3  . Cholecalciferol (VITAMIN D) 2000 UNITS capsule Take 2,000 Units by mouth daily.    Increased to 2000u/d 5/13...    . cyclobenzaprine (FLEXERIL) 5 MG tablet Take 1 tablet (5 mg total) by mouth every 8 (eight) hours as needed for muscle spasms.  100 tablet  5  . estradiol (ESTRACE) 1 MG tablet Take 1 tablet (1 mg total) by mouth daily.  31 tablet  12  . guaiFENesin (MUCINEX) 600 MG 12 hr tablet 1-2 TABLETS TWICE DAILY AS NEEDED  60 tablet  0  . potassium chloride SA (KLOR-CON M20) 20 MEQ tablet Take 1 tablet (20 mEq total) by mouth 2 (two) times daily.  180 tablet  3  . telmisartan-hydrochlorothiazide (MICARDIS HCT) 80-12.5 MG per tablet Take 1 tablet by mouth daily.  90 tablet  3  .  ACETAMINOPHEN-BUTALBITAL (CEPHADYN) 50-650 MG TABS 1 tab by mouth every 6-8 hours as needed for headaches.  She mentioned co-pay was too high...  we will try to fill again    ATORVASTATIN 20mg   Take 1 tab daily... Added 5/13...     No Known Allergies   Current Medications, Allergies, Past Medical History, Past Surgical History, Family History, and Social History were reviewed in [employer] record.   Review of Systems Constitutional:   No  weight loss, night sweats,  Fevers, chills, fatigue, or  lassitude.  HEENT:   No headaches,  Difficulty swallowing,  Tooth/dental problems, or  Sore throat,                No sneezing, itching, ear ache, nasal congestion, post nasal drip,   CV:  No chest pain,  Orthopnea, PND,  , anasarca, dizziness, palpitations, syncope.   GI  No heartburn, indigestion, abdominal pain, nausea, vomiting, diarrhea, change in bowel habits, loss of appetite, bloody stools.   Resp: No shortness of breath with exertion or at rest.  No excess mucus, no productive cough,  No non-productive cough,  No coughing up of blood.  No change in color of mucus.  No  wheezing.  No chest wall deformity  Skin: no rash or lesions.  GU: no dysuria, change in color of urine, no urgency or frequency.  No flank pain, no hematuria   MS:  No joint pain or swelling.  No decreased range of motion.  No back pain.  Psych:  No change in mood or affect. No depression or anxiety.  No memory loss.  Objective:   Physical Exam     WD, WN, 55 y/o BF in NAD... GENERAL:  Alert & oriented; pleasant & cooperative. HEENT:  [place]/AT,  , EACs-clear, TMs-wnl, NOSE-clear, THROAT-clear & wnl. NECK:  Supple w/ full ROM; no JVD; normal carotid impulses w/o bruits; no thyromegaly or nodules palpated; no lymphadenopathy. CHEST:  Clear to P & A; without wheezes/ rales/ or rhonchi. HEART:  Regular Rhythm; without murmurs/ rubs/ or gallops. ABDOMEN:  Soft & nontender; normal bowel sounds; no organomegaly or masses detected. EXT: without deformities or arthritic changes; no varicose veins/ venous insuffic/ tr  edema. NEURO:    gait normal & balance OK. DERM:  No lesions noted; no rash etc...    Assessment:

## 2011-12-28 NOTE — Patient Instructions (Addendum)
Begin Bisoprolol 5mg  daily  Low salt diet.  Avoid NSAIDs -aleve, excederin, advil, etc  Legs elevated, TEDS hose I will call with labs  follow up with Dr. Kriste Basque  As planned  Please contact office for sooner follow up if symptoms do not improve or worsen or seek emergency care

## 2011-12-31 DIAGNOSIS — R600 Localized edema: Secondary | ICD-10-CM | POA: Insufficient documentation

## 2011-12-31 NOTE — Assessment & Plan Note (Addendum)
Not at goal  Begin Bisoprolol 5mg  daily  Low salt diet.  Avoid NSAIDs -aleve, excederin, advil, etc   lab pending  follow up with Dr. Kriste Basque  As planned  Please contact office for sooner follow up if symptoms do not improve or worsen or seek emergency care

## 2011-12-31 NOTE — Assessment & Plan Note (Signed)
Suspect related to venous insufficiency  ? CCB contribution   Check bmet  Cont on lasix   Low salt diet.  Avoid NSAIDs -aleve, excederin, advil, etc  Legs elevated, TEDS hose   follow up with Dr. Kriste Basque  As planned  Please contact office for sooner follow up if symptoms do not improve or worsen or seek emergency care

## 2012-09-02 ENCOUNTER — Other Ambulatory Visit: Payer: Self-pay | Admitting: Pulmonary Disease

## 2012-10-24 ENCOUNTER — Telehealth: Payer: Self-pay | Admitting: Pulmonary Disease

## 2012-10-24 NOTE — Telephone Encounter (Signed)
lmomtcb x1 for pt 

## 2012-10-25 ENCOUNTER — Other Ambulatory Visit: Payer: Self-pay | Admitting: Pulmonary Disease

## 2012-10-25 MED ORDER — FUROSEMIDE 20 MG PO TABS: ORAL_TABLET | ORAL | Status: DC

## 2012-10-25 NOTE — Telephone Encounter (Signed)
Last ov w/ SN 5.7.13, follow up in 1 year Seen 7.15.13 w/ TP, follow up 3 months w/ SN Pt has upcoming ov w/ TP 5.14.14  Called spoke with patient, verified need for refill on lasix 20mg  Pt stated that she was notified that [facility] is having some difficulties w/ their computer system this morning and requests her refill be sent to [facility] Done Will sign off

## 2012-10-25 NOTE — Telephone Encounter (Signed)
Spoke with patient-- Patient is requesting refill on Lasix and Amlodipine Patient last has medications filled: Lasix 20mg  1 tablet daily on 09/02/12 #30 w 1 refill-- Needs to keep OV for more refills Amplodipine 10-20mg  1 tablet daily 10/20/11  #90 3 refills   Patient is scheduled to see TP tomorrow 10/26/12 Dr. Kriste Basque is this ok to fill? Please advise, thank you!  Last OV: 12/28/11

## 2012-10-26 ENCOUNTER — Encounter: Payer: Self-pay | Admitting: Adult Health

## 2012-10-26 ENCOUNTER — Ambulatory Visit (INDEPENDENT_AMBULATORY_CARE_PROVIDER_SITE_OTHER): Payer: 59 | Admitting: Adult Health

## 2012-10-26 VITALS — BP 130/82 | HR 71 | Temp 98.4°F | Ht 62.0 in | Wt 147.2 lb

## 2012-10-26 DIAGNOSIS — E7800 Pure hypercholesterolemia, unspecified: Secondary | ICD-10-CM

## 2012-10-26 DIAGNOSIS — I10 Essential (primary) hypertension: Secondary | ICD-10-CM

## 2012-10-26 DIAGNOSIS — M810 Age-related osteoporosis without current pathological fracture: Secondary | ICD-10-CM

## 2012-10-26 DIAGNOSIS — Z23 Encounter for immunization: Secondary | ICD-10-CM

## 2012-10-26 NOTE — Patient Instructions (Addendum)
TDAP today .  Set up mammogram  Low fat cholesterol diet  Exercise as tolerated.  Begin Oscal D Twice daily  , and Vitamin D 2000IU daily  Return for fasting labs.  Follow up with Dr. Kriste Basque  6 months and As needed   Please contact office for sooner follow up if symptoms do not improve or worsen or seek emergency care

## 2012-10-27 NOTE — Progress Notes (Signed)
Subjective:     Patient ID: Jacqueline Waters, female   DOB: Nov 01, 1955, 57 y.o.   MRN: [id number]  HPI  57 y/o BF  With known hx of HTN and Hyperlipidemia   10/26/12 Follow up  Returns for routine follow up and med refills.  She says she is doing very well with any known difficulties Tolerating meds well.  B/p have been running good on avg ~130 systolic.  No chest pain, edema or dyspnea.  She is not fasting today , agrees to return for labs /fasting.  She is due for her mammogram and Td booster.  Monthly BSE nml .  Colon utd.  Not taking oscal      Problem List:    << PROBLEM LIST UPDATED 10/20/11 >>  HYPERTENSION (ICD-401.9)  ~  1/09:  BP= 126/64 & denies HA, fatigue, visual changes, CP, palipit, dizziness, syncope, dyspnea, edema, etc... ~  CXR 1/09 showed normal heart size 7 clear lungs, WNL.[name]. ~  5/13:  BP= 134/82 & she remains asymptomatic w/o CP, palpit, SOB, edema, etc...  Family Hx of CAD (ICD-414.00) - she had a Cardiolite in 2001= normal without ischemia and EF=73%... ~  5/13:  She is active, exercises by walking & running, no CP or exertional symptoms... ~11/2011 Stop Micardis , rx lasix  Bisoprolol 5mg  daily rx   HYPERCHOLESTEROLEMIA (ICD-272.0) - she has not been on medication, trying to control w/ diet alone...  ~  FLP 5/07 showed TChol 190, TG 40, HDL 44, LDL138... her LDL's have been betw 124-168 for years and we discussed starting a statin med if needed. ~  FLP 1/09 showed TChol 224, TG 67, HDL 42, LDL 167... Needs better diet efforts. ~  FLP 1/11 showed TChol 195, TG 48, HDL 48, LDL 138 ~  FLP 5/13 showed TChol 218, TG 57, HDL 57, LDL 167... TIME TO START RX- rec ATORVA20.  HYSTERECTOMY, HX OF (ICD-V45.77) ~  She is followed for GYN by [name] in [facility]...  DEGENERATIVE DISC DISEASE (ICD-722.6) BACK PAIN, LUMBAR (ICD-724.2) - she uses OTC analgesics as needed... ~  5/13:  She notes that back has been doing well, able to exercise regularly w/o  pain...  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 ~  Labs 1/11 showed Vit D level = 10... Given Vit D 50K weekly for several months... ~  Labs 5/11 showed Vit D level = 35 & she was switched to 1000u OTC daily... ~  Labs 5/13 showed Vit D level = 26 but she hasn't been taking the supplement; rec incr to 2000u/d to catch up...  HEADACHE, MIXED (ICD-784.0) - responded well to CEPHADYN 50-650 (Butalbital-Acetomenophen) perscribed by [name] in the past; she would like to continue this med but when she tried to fill it the co-pay was ?Tier3?  We re-wrote the Rx in generic form & she will check w/ Pharm...  HEALTH MAINTENANCE: ~  GI:  She sees [name] & had a screening colonoscopy 4/10 which was neg- no divertics, polyps, hems, etc; f/u planned 10 yrs. ~  GYN:  [name] in [place]; had PAP in 2012 but doesn't need yearly due to [employer]; she gets Mammograms at [facility] & last= 6/12 and neg; She doesn't recall prev BMD & we will sched one here for baseline... ~  Immunizations:  She is up to date on required vaccines thru [facility] employee health...   Past Surgical History  Procedure Laterality Date  . Vesicovaginal fistula closure w/ tah    .  Cystectomy      left breast     Outpatient Encounter Prescriptions as of 10/20/2011  Medication Sig Dispense Refill  . amLODipine-benazepril (LOTREL) 10-20 MG per capsule Take 1 capsule by mouth daily.  90 capsule  3  . Cholecalciferol (VITAMIN D) 2000 UNITS capsule Take 2,000 Units by mouth daily.    Increased to 2000u/d 5/13...    . cyclobenzaprine (FLEXERIL) 5 MG tablet Take 1 tablet (5 mg total) by mouth every 8 (eight) hours as needed for muscle spasms.  100 tablet  5  . estradiol (ESTRACE) 1 MG tablet Take 1 tablet (1 mg total) by mouth daily.  31 tablet  12  . guaiFENesin (MUCINEX) 600 MG 12 hr tablet 1-2 TABLETS TWICE DAILY AS NEEDED  60 tablet  0  . potassium chloride SA (KLOR-CON M20) 20 MEQ tablet Take 1 tablet (20 mEq  total) by mouth 2 (two) times daily.  180 tablet  3  . telmisartan-hydrochlorothiazide (MICARDIS HCT) 80-12.5 MG per tablet Take 1 tablet by mouth daily.  90 tablet  3  .  ACETAMINOPHEN-BUTALBITAL (CEPHADYN) 50-650 MG TABS 1 tab by mouth every 6-8 hours as needed for headaches.  She mentioned co-pay was too high...  we will try to fill again    ATORVASTATIN 20mg   Take 1 tab daily... Added 5/13...     No Known Allergies   Current Medications, Allergies, Past Medical History, Past Surgical History, Family History, and Social History were reviewed in [employer] record.   Review of Systems Constitutional:   No  weight loss, night sweats,  Fevers, chills, fatigue, or  lassitude.  HEENT:   No headaches,  Difficulty swallowing,  Tooth/dental problems, or  Sore throat,                No sneezing, itching, ear ache, nasal congestion, post nasal drip,   CV:  No chest pain,  Orthopnea, PND,  , anasarca, dizziness, palpitations, syncope.   GI  No heartburn, indigestion, abdominal pain, nausea, vomiting, diarrhea, change in bowel habits, loss of appetite, bloody stools.   Resp: No shortness of breath with exertion or at rest.  No excess mucus, no productive cough,  No non-productive cough,  No coughing up of blood.  No change in color of mucus.  No wheezing.  No chest wall deformity  Skin: no rash or lesions.  GU: no dysuria, change in color of urine, no urgency or frequency.  No flank pain, no hematuria   MS:  No joint pain or swelling.  No decreased range of motion.  No back pain.  Psych:  No change in mood or affect. No depression or anxiety.  No memory loss.           Objective:   Physical Exam     WD, WN, 57 y/o BF in NAD... GENERAL:  Alert & oriented; pleasant & cooperative. HEENT:  [place]/AT,  , EACs-clear, TMs-wnl, NOSE-clear, THROAT-clear & wnl. NECK:  Supple w/ full ROM; no JVD; normal carotid impulses w/o bruits; no thyromegaly or nodules palpated; no  lymphadenopathy. CHEST:  Clear to P & A; without wheezes/ rales/ or rhonchi. HEART:  Regular Rhythm; without murmurs/ rubs/ or gallops. ABDOMEN:  Soft & nontender; normal bowel sounds; no organomegaly or masses detected. EXT: without deformities or arthritic changes; no varicose veins/ venous insuffic/ tr  edema. NEURO:    gait normal & balance OK. DERM:  No lesions noted; no rash etc...    Assessment:

## 2012-10-27 NOTE — Assessment & Plan Note (Signed)
Advised on low fat cholesterol diet  Exercise , wt loss

## 2012-10-27 NOTE — Assessment & Plan Note (Signed)
Restart oscal d w/ vit d 2000 IU daily  Check vit d with labs

## 2012-10-27 NOTE — Assessment & Plan Note (Signed)
Compensated no present regimen   Plan  TDAP today .  Set up mammogram  Low fat cholesterol diet  Exercise as tolerated.  Begin Oscal D Twice daily  , and Vitamin D 2000IU daily  Return for fasting labs.  Follow up with Dr. Kriste Basque  6 months and As needed   Please contact office for sooner follow up if symptoms do not improve or worsen or seek emergency care

## 2013-01-12 ENCOUNTER — Other Ambulatory Visit: Payer: Self-pay | Admitting: Obstetrics & Gynecology

## 2013-01-16 ENCOUNTER — Telehealth: Payer: Self-pay | Admitting: *Deleted

## 2013-01-16 NOTE — Telephone Encounter (Signed)
Patient is requesting a refill of her estradiol tablets 2mg .  Refill sent to pharmacy.  Unable to enter medication into epic.

## 2013-03-02 ENCOUNTER — Other Ambulatory Visit: Payer: Self-pay | Admitting: Pulmonary Disease

## 2013-04-28 ENCOUNTER — Other Ambulatory Visit: Payer: Self-pay | Admitting: Adult Health

## 2013-05-03 ENCOUNTER — Ambulatory Visit: Payer: 59 | Admitting: Pulmonary Disease

## 2013-06-12 ENCOUNTER — Other Ambulatory Visit: Payer: Self-pay | Admitting: Pulmonary Disease

## 2013-06-14 ENCOUNTER — Telehealth: Payer: Self-pay | Admitting: Adult Health

## 2013-06-14 MED ORDER — FUROSEMIDE 20 MG PO TABS: ORAL_TABLET | ORAL | Status: DC

## 2013-06-14 NOTE — Telephone Encounter (Signed)
Per TP's last note (10/2012), pt was to follow up in 6 months with SN. Advised pt that we would send in a 30 day supply and to call back to scheduled ROV.

## 2013-06-21 ENCOUNTER — Telehealth: Payer: Self-pay | Admitting: Pulmonary Disease

## 2013-06-21 NOTE — Telephone Encounter (Signed)
Spoke with daughter and appt scheduled with TP since SN is not here.

## 2013-06-30 ENCOUNTER — Ambulatory Visit: Payer: 59 | Admitting: Adult Health

## 2013-06-30 ENCOUNTER — Telehealth: Payer: Self-pay | Admitting: *Deleted

## 2013-06-30 DIAGNOSIS — Z0000 Encounter for general adult medical examination without abnormal findings: Secondary | ICD-10-CM

## 2013-06-30 MED ORDER — BISOPROLOL FUMARATE 5 MG PO TABS: ORAL_TABLET | ORAL | Status: DC

## 2013-06-30 NOTE — Telephone Encounter (Signed)
Called and spoke with pt and she stated that she has appt today with TP but is not sure why.  Pt's last appt with SN was back in 2013.  She wanted to schedule appt with SN and this has been done for 07/2013 and pt is aware that she can do labs prior to this appt.  appt with TP has been cancelled for today.

## 2013-07-18 ENCOUNTER — Telehealth: Payer: Self-pay | Admitting: Pulmonary Disease

## 2013-07-18 MED ORDER — AMLODIPINE BESY-BENAZEPRIL HCL 10-20 MG PO CAPS: 1.0000 | ORAL_CAPSULE | Freq: Every day | ORAL | Status: DC

## 2013-07-18 NOTE — Telephone Encounter (Signed)
Rx has been sent in. Pt is aware. 

## 2013-07-25 ENCOUNTER — Other Ambulatory Visit (INDEPENDENT_AMBULATORY_CARE_PROVIDER_SITE_OTHER): Payer: 59

## 2013-07-25 DIAGNOSIS — Z0000 Encounter for general adult medical examination without abnormal findings: Secondary | ICD-10-CM

## 2013-07-25 LAB — CBC WITH DIFFERENTIAL/PLATELET
Basophils Absolute: 0 10*3/uL (ref 0.0–0.1)
Basophils Relative: 0.3 % (ref 0.0–3.0)
Eosinophils Absolute: 0 10*3/uL (ref 0.0–0.7)
Eosinophils Relative: 0.7 % (ref 0.0–5.0)
HCT: 42.4 % (ref 36.0–46.0)
Hemoglobin: 14.2 g/dL (ref 12.0–15.0)
Lymphocytes Relative: 38.6 % (ref 12.0–46.0)
Lymphs Abs: 1.7 10*3/uL (ref 0.7–4.0)
MCHC: 33.5 g/dL (ref 30.0–36.0)
MCV: 88.2 fl (ref 78.0–100.0)
Monocytes Absolute: 0.4 10*3/uL (ref 0.1–1.0)
Monocytes Relative: 8.1 % (ref 3.0–12.0)
Neutro Abs: 2.3 10*3/uL (ref 1.4–7.7)
Neutrophils Relative %: 52.3 % (ref 43.0–77.0)
Platelets: 282 10*3/uL (ref 150.0–400.0)
RBC: 4.81 Mil/uL (ref 3.87–5.11)
RDW: 13.2 % (ref 11.5–14.6)
WBC: 4.4 10*3/uL — ABNORMAL LOW (ref 4.5–10.5)

## 2013-07-25 LAB — BASIC METABOLIC PANEL
BUN: 12 mg/dL (ref 6–23)
CO2: 29 mEq/L (ref 19–32)
Calcium: 9.3 mg/dL (ref 8.4–10.5)
Chloride: 104 mEq/L (ref 96–112)
Creatinine, Ser: 0.8 mg/dL (ref 0.4–1.2)
GFR: 101.08 mL/min (ref >60.00)
Glucose, Bld: 86 mg/dL (ref 70–99)
Potassium: 3.2 mEq/L — ABNORMAL LOW (ref 3.5–5.1)
Sodium: 139 mEq/L (ref 135–145)

## 2013-07-25 LAB — URINALYSIS
Bilirubin Urine: NEGATIVE
Hgb urine dipstick: NEGATIVE
Ketones, ur: NEGATIVE
Leukocytes, UA: NEGATIVE
Nitrite: NEGATIVE
Specific Gravity, Urine: 1.015 (ref 1.000–1.030)
Total Protein, Urine: NEGATIVE
Urine Glucose: NEGATIVE
Urobilinogen, UA: 0.2 (ref 0.0–1.0)
pH: 6 (ref 5.0–8.0)

## 2013-07-25 LAB — HEPATIC FUNCTION PANEL
ALT: 13 U/L (ref 0–35)
AST: 16 U/L (ref 0–37)
Albumin: 4 g/dL (ref 3.5–5.2)
Alkaline Phosphatase: 52 U/L (ref 39–117)
Bilirubin, Direct: 0 mg/dL (ref 0.0–0.3)
Total Bilirubin: 0.6 mg/dL (ref 0.3–1.2)
Total Protein: 7.8 g/dL (ref 6.0–8.3)

## 2013-07-25 LAB — LDL CHOLESTEROL, DIRECT: Direct LDL: 165.6 mg/dL

## 2013-07-25 LAB — TSH: TSH: 3.55 u[IU]/mL (ref 0.35–5.50)

## 2013-07-25 LAB — LIPID PANEL
Cholesterol: 218 mg/dL — ABNORMAL HIGH (ref 0–200)
HDL: 52.6 mg/dL (ref >39.00)
Total CHOL/HDL Ratio: 4
Triglycerides: 73 mg/dL (ref 0.0–149.0)
VLDL: 14.6 mg/dL (ref 0.0–40.0)

## 2013-07-26 LAB — VITAMIN D 25 HYDROXY (VIT D DEFICIENCY, FRACTURES): Vit D, 25-Hydroxy: 32 ng/mL (ref 30–89)

## 2013-07-27 ENCOUNTER — Ambulatory Visit (INDEPENDENT_AMBULATORY_CARE_PROVIDER_SITE_OTHER)
Admission: RE | Admit: 2013-07-27 | Discharge: 2013-07-27 | Disposition: A | Discharge status: Home / Self Care | Payer: 59 | Source: Ambulatory Visit | Attending: Pulmonary Disease | Admitting: Pulmonary Disease

## 2013-07-27 ENCOUNTER — Encounter: Payer: Self-pay | Admitting: Pulmonary Disease

## 2013-07-27 ENCOUNTER — Ambulatory Visit (INDEPENDENT_AMBULATORY_CARE_PROVIDER_SITE_OTHER): Payer: 59 | Admitting: Pulmonary Disease

## 2013-07-27 VITALS — BP 140/78 | HR 56 | Temp 97.1°F | Ht 62.0 in | Wt 148.0 lb

## 2013-07-27 DIAGNOSIS — Z0000 Encounter for general adult medical examination without abnormal findings: Secondary | ICD-10-CM

## 2013-07-27 DIAGNOSIS — I10 Essential (primary) hypertension: Secondary | ICD-10-CM

## 2013-07-27 DIAGNOSIS — E7800 Pure hypercholesterolemia, unspecified: Secondary | ICD-10-CM

## 2013-07-27 MED ORDER — FUROSEMIDE 20 MG PO TABS: ORAL_TABLET | ORAL | Status: DC

## 2013-07-27 MED ORDER — POTASSIUM CHLORIDE CRYS ER 20 MEQ PO TBCR: EXTENDED_RELEASE_TABLET | ORAL | Status: DC

## 2013-07-27 MED ORDER — AMLODIPINE BESY-BENAZEPRIL HCL 10-20 MG PO CAPS: 1.0000 | ORAL_CAPSULE | Freq: Every day | ORAL | Status: DC

## 2013-07-27 MED ORDER — BISOPROLOL FUMARATE 5 MG PO TABS: ORAL_TABLET | ORAL | Status: DC

## 2013-07-27 MED ORDER — ATORVASTATIN CALCIUM 20 MG PO TABS: 20.0000 mg | ORAL_TABLET | Freq: Every morning | ORAL | Status: DC

## 2013-07-27 NOTE — Progress Notes (Signed)
Subjective:     Patient ID: Jacqueline Waters, female   DOB: December 18, 1955, 58 y.o.   MRN: [id number]  HPI 58 y/o BF here for a follow up visit and physical exam...   ~  June 29, 2007:  Jacqueline Waters looks great, is doing well and has no specific complaints today... 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 See problem list below>  ~  Oct 20, 2011:  >30yrROV & CPX... CCaren Griffinsspent 550yrat Gu[employer]Own[employer] Insurancethen [id number] [facility] is now working for the [employer]... She continues to do well overall w/o new complaints or concerns...  We reviewed her prob list, meds, prev XRays & Labs; she will ret for FASTING blood work>> LABS 5/13:  FLP- Chol not at goals w/ LDL=167;  Chems- wnl;  CBC- wnl;  TSH=4.89;  VitD=26;  UA=clear  ~  July 27, 2013:  86m80mo & CPX> [name] a good interval w/o new complaints or concerns... We reviewed the following medical problems during today's office visit >>     HBP> on Zebeta5, Lotrel10-20, Lasix20, K20Bid; BP= 140/78 & similar at home; she denies CP, palpit, dizzy, SOB, edema, etc; she follows a low sodium diet & exercises by walking...     CHOL> prev rec to start meds and prescribed Atorva20; she forgets it freq & estimates taking it ?qod; FLP 2/15 showed TChol 218, TG 73, HDL 53, LDL 166 and asked to take it DAILY w/ repeat FLP in 61mo.22mo  [facility]...    DJD, LBP> she uses OTC analgesics as needed; no recent issues w/ back but she has seen Ortho for left knee...    Vit D Defic> prev VitD level was 10, placed on VitD 50K weekly & improved; she switched to OTC VitD 1000u daily w/ Vit D level 2/15= 32... Rec to incr to 2000u daily, and she needs a baseline BMD scheduled...    Hx Headaches> hx mixed HAs in the past; she has responded to ButalParagonhe past & requests refill Rx- ok... We reviewed prob list, meds,  xrays and labs> see below for updates >>            Problem List:      HYPERTENSION (ICD-401.9) >>  ~  controlled on LOTREL 10-20 & MICARDIS/HCT 80-12.5... ~ Jacqueline KitchenMarland Waters/09:  BP= 126/64 & denies HA, fatigue, visual changes, CP, palipit, dizziness, syncope, dyspnea, edema, etc... ~  CXR 1/09 showed normal heart size 7 clear lungs, WNL... ~ Jacqueline Kitchen5/13:  BP= 134/82 & she remains asymptomatic w/o CP, palpit, SOB, edema, etc... ~  2/15: on Zebeta5, Lotrel10-20, Lasix20, K20Bid; BP= 140/78 & similar at home; she denies CP, palpit, dizzy, SOB, edema, etc; she follows a low sodium diet & exercises by walking  Family Hx of CAD (ICD-414.00) - she had a Cardiolite in 2001= normal without ischemia and EF=73%... ~  5/13:  She is active, exercises by walking & running, no CP or exertional symptoms...  HYPERCHOLESTEROLEMIA (ICD-272.0) - she has not been on medication, trying to control w/ diet alone...  ~  FLP [place] showed TChol 190, TG 40, HDL 44, LDL138... her LDL's have been betw 124-168 for years and we discussed starting a statin med if needed. ~  FLP [place] showed TChol 224, TG 67, HDL 42, LDL 167... Needs better diet efforts. ~  FLP  1/11 showed TChol 195, TG 48, HDL 48, LDL 138 ~  FLP 5/13 showed TChol 218, TG 57, HDL 57, LDL 167... TIME TO START RX- rec ATORVA20. ~  [place] 2/15 on Atorva20 intermittently showed TChol 218, TG 73, HDL 53, LDL 166... rec to take it everyday & recheck FLP 28mo..  HYSTERECTOMY, HX OF (ICD-V45.77) ~  [facility]  DEGENERATIVE DISC DISEASE (ICD-722.6) BACK PAIN, LUMBAR (ICD-724.2) - she uses OTC analgesics as needed... ~  5/13:  She notes that back has been doing well, able to exercise regularly w/o pain...  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 ~  Labs 1/11 showed Vit D level = 10... Given Vit D 50K weekly for several months... ~  Labs 5/11 showed Vit D level = 35 & she was switched to 1000u OTC daily... ~  Labs 5/13  showed Vit D level = 26 but she hasn't been taking the supplement; rec incr to 2000u/d to catch up...  HEADACHE, MIXED (ICD-784.0) - responded well to CSulphur(Butalbital-Acetomenophen) perscribed by [name] in the past; she would like to continue this med but when she tried to fill it the co-pay was ?Tier3?  We re-wrote the Rx in generic form & she will check w/ Pharm...  HEALTH MAINTENANCE: ~  GI:  She sees Jacqueline Waters & had a screening colonoscopy 4/10 which was neg- no divertics, polyps, hems, etc; f/u planned 10 yrs. ~  [place]  [place] but doesn't need yearly due to [name] [facility]; She doesn't recall prev BMD & we will sched one here for baseline... ~  Immunizations:  She is up to date on required vaccines thru [facility]...   Past Surgical History  Procedure Laterality Date  . Vesicovaginal fistula closure w/ tah    . Cystectomy      left breast     Outpatient Encounter Prescriptions as of 07/27/2013  Medication Sig  . amLODipine-benazepril (LOTREL) 10-20 MG per capsule Take 1 capsule by mouth daily.  .Jacqueline Kitchenatorvastatin (LIPITOR) 20 MG tablet Take 1 tablet (20 mg total) by mouth daily.  . bisoprolol (ZEBETA) 5 MG tablet TAKE 1 TABLET BY MOUTH DAILY.  . calcium carbonate (OS-CAL) 600 MG TABS tablet Take 600 mg by mouth daily with breakfast.  . estradiol (ESTRACE) 2 MG tablet TAKE 1 TABLET BY MOUTH DAILY  . furosemide (LASIX) 20 MG tablet TAKE 1 TABLET BY MOUTH ONCE DAILY  . KLOR-CON M20 20 MEQ tablet TAKE 1 TABLET BY MOUTH 2 TIMES DAILY.  . Multiple Vitamins-Minerals (CENTRUM PO) Take 1 tablet by mouth daily.  . [DISCONTINUED] estradiol (ESTRACE) 1 MG tablet Take 1 tablet (1 mg total) by mouth daily.    No Known Allergies   Current Medications, Allergies, Past Medical History, Past Surgical History, Family History, and Social History were reviewed in [employer] record.   Review of Systems    The patient denies fever, chills, sweats, anorexia, fatigue, weakness, malaise, weight loss, sleep disorder, blurring, diplopia, eye irritation, eye discharge, vision loss, eye pain, photophobia, earache, ear discharge, tinnitus, decreased hearing, nasal congestion, nosebleeds, sore throat, hoarseness, chest pain, palpitations, syncope, dyspnea on exertion, orthopnea, PND, peripheral edema, cough, dyspnea at rest, excessive sputum, hemoptysis, wheezing, pleurisy, nausea, vomiting, diarrhea, constipation, change in bowel habits, abdominal pain, melena, hematochezia, jaundice, gas/bloating, indigestion/heartburn, dysphagia, odynophagia, dysuria, hematuria,  urinary frequency, urinary hesitancy, nocturia, incontinence, back pain, joint pain, joint swelling, muscle cramps, muscle weakness, stiffness, arthritis, sciatica, restless legs, leg pain at night, leg pain with exertion, rash, itching, dryness, suspicious lesions, paralysis, paresthesias, seizures, tremors, vertigo, transient blindness, frequent falls, frequent headaches, difficulty walking, depression, anxiety, memory loss, confusion, cold intolerance, heat intolerance, polydipsia, polyphagia, polyuria, unusual weight change, abnormal bruising, bleeding, enlarged lymph nodes, urticaria, allergic rash, hay fever, and recurrent infections.     Objective:   Physical Exam    WD, WN, 58 y/o BF in NAD... GENERAL:  Alert & oriented; pleasant & cooperative. HEENT:  [place]/AT, EOM-wnl, PERRLA, Fundi-benign, EACs-clear, TMs-wnl, NOSE-clear, THROAT-clear & wnl. NECK:  Supple w/ full ROM; no JVD; normal carotid impulses w/o bruits; no thyromegaly or nodules palpated; no lymphadenopathy. CHEST:  Clear to P & A; without wheezes/ rales/ or rhonchi. HEART:  Regular Rhythm; without murmurs/ rubs/ or gallops. ABDOMEN:  Soft & nontender; normal bowel sounds; no organomegaly or masses detected. EXT: without deformities or arthritic  changes; no varicose veins/ venous insuffic/ or edema. NEURO:  CN's intact; motor testing normal; sensory testing normal; gait normal & balance OK. DERM:  No lesions noted; no rash etc...  RADIOLOGY DATA:  Reviewed in the EPIC EMR & discussed w/ the patient...  LABORATORY DATA:  Reviewed in the EPIC EMR & discussed w/ the patient...   Assessment:      CPX>>  FLP ret w/ LDL 166 (Rec to take Atorva20 everyday);  Vit D ret low & OTC supplement incr to 2000u daily...  HBP>  Controlled on Lotrel, Zebeata, & Lasix?K; discussed low sodium etc...  Fam Hx CAD>  Aware, she has no exertional chest symptoms & has been exercising regularly...  CHOL>  Trying to control w/ diet & exercise but I have been in favor of Statin Rx for some time==> LDL STILL 167- rec to start Lip20.  DDD/ LBP>  She denies recent LBP or difficulty w/ her exercise program...  Vit D Defic>  She has been off the Vit D 1000u OTC supplement & will restart this Rx==> rec incr to 2000u/d for now. ~  5/13:  She has never had a baseline BMD & we will sched this for her...  Hx HAs>  She doesn't need the Butalbital often but likes to have them on hand; we will check for lower co-pay generic product...     Plan:     Patient's Medications  New Prescriptions   No medications on file  Previous Medications   CALCIUM CARBONATE (OS-CAL) 600 MG TABS TABLET    Take 600 mg by mouth daily with breakfast.   MULTIPLE VITAMINS-MINERALS (CENTRUM PO)    Take 1 tablet by mouth daily.  Modified Medications   Modified Medication Previous Medication   AMLODIPINE-BENAZEPRIL (LOTREL) 10-20 MG PER CAPSULE amLODipine-benazepril (LOTREL) 10-20 MG per capsule      Take 1 capsule by mouth daily.    Take 1 capsule by mouth daily.   ATORVASTATIN (LIPITOR) 20 MG TABLET atorvastatin (LIPITOR) 20 MG tablet      Take 1 tablet (20 mg total) by mouth every morning.    Take 1 tablet (20 mg total) by mouth daily.   BISOPROLOL (ZEBETA) 5 MG TABLET bisoprolol  (ZEBETA) 5 MG tablet      TAKE 1 TABLET BY MOUTH DAILY.    TAKE 1 TABLET BY MOUTH DAILY.   ESTRADIOL (ESTRACE) 2 MG TABLET estradiol (ESTRACE) 2 MG tablet      TAKE 1  TABLET BY MOUTH DAILY    TAKE 1 TABLET BY MOUTH DAILY   FUROSEMIDE (LASIX) 20 MG TABLET furosemide (LASIX) 20 MG tablet      TAKE 1 TABLET BY MOUTH ONCE DAILY    TAKE 1 TABLET BY MOUTH ONCE DAILY   POTASSIUM CHLORIDE SA (KLOR-CON M20) 20 MEQ TABLET KLOR-CON M20 20 MEQ tablet      TAKE 2 TABLETs BY MOUTH  DAILY.    TAKE 1 TABLET BY MOUTH 2 TIMES DAILY.  Discontinued Medications   ATORVASTATIN (LIPITOR) 20 MG TABLET    Take 20 mg by mouth every morning.   ESTRADIOL (ESTRACE) 1 MG TABLET    Take 1 tablet (1 mg total) by mouth daily.   POTASSIUM CHLORIDE SA (KLOR-CON M20) 20 MEQ TABLET    TAKE 2 TABLETs BY MOUTH  DAILY.

## 2013-07-27 NOTE — Patient Instructions (Signed)
Today we updated your med list in our EPIC system...    Continue your current medications the same...  We discussed taking the Atorvastatin20mg  daily (AM is ok) and taking the Potassium 8mEq- 2 tabs daily (both in AM is ok too)...  Let's plan a recheck of your Lipid profile & potassium in 3-4 months (~early June 2015 so mark your calendar & call us when you are ready to recheck the labs)  Keep up the good work w/ diet & exercise...  Call for any questions.Marland KitchenMarland Kitchen

## 2013-07-31 ENCOUNTER — Other Ambulatory Visit: Payer: Self-pay

## 2013-07-31 DIAGNOSIS — Z1231 Encounter for screening mammogram for malignant neoplasm of breast: Secondary | ICD-10-CM

## 2013-08-02 ENCOUNTER — Ambulatory Visit: Payer: 59

## 2013-08-07 ENCOUNTER — Ambulatory Visit (INDEPENDENT_AMBULATORY_CARE_PROVIDER_SITE_OTHER): Payer: 59 | Admitting: Adult Health

## 2013-08-07 ENCOUNTER — Encounter: Payer: Self-pay | Admitting: Adult Health

## 2013-08-07 VITALS — BP 144/88 | HR 59 | Temp 98.3°F | Ht 62.5 in | Wt 148.8 lb

## 2013-08-07 DIAGNOSIS — H1030 Unspecified acute conjunctivitis, unspecified eye: Secondary | ICD-10-CM

## 2013-08-07 MED ORDER — POLYMYXIN B-TRIMETHOPRIM 10000-0.1 UNIT/ML-% OP SOLN: 1.0000 [drp] | Freq: Four times a day (QID) | OPHTHALMIC | Status: AC

## 2013-08-07 NOTE — Patient Instructions (Addendum)
Polytrim 1 drop to left eye four-five times daily x 1 week .  Please contact office for sooner follow up if symptoms do not improve or worsen or seek emergency care

## 2013-08-08 DIAGNOSIS — H1030 Unspecified acute conjunctivitis, unspecified eye: Secondary | ICD-10-CM | POA: Insufficient documentation

## 2013-08-08 NOTE — Assessment & Plan Note (Signed)
Acute Left conjunctivitis   Plan  Polytrim 1 drop to left eye four-five times daily x 1 week .  Please contact office for sooner follow up if symptoms do not improve or worsen or seek emergency care  Advised if not improving will need to see opthalmology

## 2013-08-08 NOTE — Progress Notes (Signed)
Subjective:     Patient ID: Jacqueline Waters, female   DOB: [birth year], 58 y.o.   MRN: [id number]  HPI  58 y/o BF  With known hx of HTN and Hyperlipidemia   08/07/13 Acute OV   Complains of conjuncitivits left eye x2days - redness, scratchiness, discharge, discomfort. Woke up yesterday with red left eye, yellow discharge noted. No visual changes.  No known injury . No pain with blinking. Does not wear glasses or contacts.  No recent eye meds.  Works in [employer] for [employer].     Problem List:      HYPERTENSION (ICD-401.9)  ~  1/09:  BP= 126/64 & denies HA, fatigue, visual changes, CP, palipit, dizziness, syncope, dyspnea, edema, etc... ~  CXR 1/09 showed normal heart size 7 clear lungs, WNL.[name]. ~  5/13:  BP= 134/82 & she remains asymptomatic w/o CP, palpit, SOB, edema, etc...  Family Hx of CAD (ICD-414.00) - she had a Cardiolite in 2001= normal without ischemia and EF=73%... ~  5/13:  She is active, exercises by walking & running, no CP or exertional symptoms... ~11/2011 Stop Micardis , rx lasix  Bisoprolol 5mg  daily rx   HYPERCHOLESTEROLEMIA (ICD-272.0) - she has not been on medication, trying to control w/ diet alone...  ~  [place] 5/07 showed TChol 190, TG 40, HDL 44, LDL138... her LDL's have been betw 124-168 for years and we discussed starting a statin med if needed. ~  [place] 1/09 showed TChol 224, TG 67, HDL 42, LDL 167... Needs better diet efforts. ~  FLP 1/11 showed TChol 195, TG 48, HDL 48, LDL 138 ~  FLP 5/13 showed TChol 218, TG 57, HDL 57, LDL 167... TIME TO START RX- rec [id number].  HYSTERECTOMY, HX OF (ICD-V45.77) ~  She is followed for GYN by [name] in [facility]...  DEGENERATIVE DISC DISEASE (ICD-722.6) BACK PAIN, LUMBAR (ICD-724.2) - she uses OTC analgesics as needed... ~  5/13:  She notes that back has been doing well, able to exercise regularly w/o pain...  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 ~  Labs 1/11 showed Vit D level = 10... Given Vit D 50K weekly  for several months... ~  Labs 5/11 showed Vit D level = 35 & she was switched to 1000u OTC daily... ~  Labs 5/13 showed Vit D level = 26 but she hasn't been taking the supplement; rec incr to 2000u/d to catch up...  HEADACHE, MIXED (ICD-784.0) - responded well to [place] (Butalbital-Acetomenophen) perscribed by [name] in the past; she would like to continue this med but when she tried to fill it the co-pay was ?Tier3?  We re-wrote the Rx in generic form & she will check w/ Pharm...  HEALTH MAINTENANCE: ~  GI:  She sees [name] & had a screening colonoscopy 4/10 which was neg- no divertics, polyps, hems, etc; f/u planned 10 yrs. ~  GYN:  [name] in [place]; had PAP in 2012 but doesn't need yearly due to [employer]; she gets Mammograms at [place] last= 6/12 and neg; She doesn't recall prev BMD & we will sched one here for baseline... ~  Immunizations:  She is up to date on required vaccines thru [facility] employee health...   Past Surgical History  Procedure Laterality Date  . Vesicovaginal fistula closure w/ tah    . Cystectomy      left breast   No Known Allergies   Current Medications, Allergies, Past Medical History, Past Surgical History, Family History,  and Social History were reviewed in [employer] record.   Review of Systems Constitutional:   No  weight loss, night sweats,  Fevers, chills, fatigue, or  lassitude.  HEENT:   No headaches,  Difficulty swallowing,  Tooth/dental problems, or  Sore throat,                No sneezing, itching, ear ache, nasal congestion, post nasal drip,   CV:  No chest pain,  Orthopnea, PND,  , anasarca, dizziness, palpitations, syncope.   GI  No heartburn, indigestion, abdominal pain, nausea, vomiting, diarrhea, change in bowel habits, loss of appetite, bloody stools.   Resp: No shortness of breath with exertion or at rest.  No excess mucus, no productive cough,  No non-productive cough,  No coughing up of  blood.  No change in color of mucus.  No wheezing.  No chest wall deformity  Skin: no rash or lesions.  GU: no dysuria, change in color of urine, no urgency or frequency.  No flank pain, no hematuria   MS:  No joint pain or swelling.  No decreased range of motion.  No back pain.  Psych:  No change in mood or affect. No depression or anxiety.  No memory loss.           Objective:   Physical Exam     WD, WN, 58 y/o BF in NAD... GENERAL:  Alert & oriented; pleasant & cooperative. HEENT:  [place]/AT,  , EACs-clear, TMs-wnl, NOSE-clear, THROAT-clear & wnl. EOM intact. Left conjunctiva injected. No drainage noted. Difficult to visualize fundi.  NECK:  Supple w/ full ROM; no JVD; normal carotid impulses w/o bruits; no thyromegaly or nodules palpated; no lymphadenopathy. CHEST:  Clear to P & A; without wheezes/ rales/ or rhonchi. HEART:  Regular Rhythm; without murmurs/ rubs/ or gallops. NEURO:  No focal deficits noted.  DERM:  No lesions noted; no rash etc...    Assessment:

## 2013-08-17 ENCOUNTER — Ambulatory Visit: Payer: 59

## 2013-08-22 ENCOUNTER — Ambulatory Visit
Admission: RE | Admit: 2013-08-22 | Discharge: 2013-08-22 | Disposition: A | Discharge status: Home / Self Care | Payer: 59 | Source: Ambulatory Visit

## 2013-08-22 DIAGNOSIS — Z1231 Encounter for screening mammogram for malignant neoplasm of breast: Secondary | ICD-10-CM

## 2014-02-19 ENCOUNTER — Encounter: Payer: Self-pay | Admitting: Family Medicine

## 2014-02-23 ENCOUNTER — Ambulatory Visit: Payer: 59 | Admitting: Family Medicine

## 2014-02-23 ENCOUNTER — Other Ambulatory Visit: Payer: Self-pay | Admitting: Obstetrics & Gynecology

## 2014-03-02 ENCOUNTER — Encounter: Payer: Self-pay | Admitting: Internal Medicine

## 2014-03-02 NOTE — Telephone Encounter (Signed)
Patient needs rx refill of her estrogen medication.  She has made an appointment for a yearly exam.

## 2014-03-14 ENCOUNTER — Ambulatory Visit (INDEPENDENT_AMBULATORY_CARE_PROVIDER_SITE_OTHER): Payer: 59 | Admitting: Obstetrics & Gynecology

## 2014-03-14 ENCOUNTER — Encounter: Payer: Self-pay | Admitting: Obstetrics & Gynecology

## 2014-03-14 VITALS — BP 156/87 | HR 52 | Ht 62.0 in | Wt 145.0 lb

## 2014-03-14 DIAGNOSIS — N951 Menopausal and female climacteric states: Secondary | ICD-10-CM

## 2014-03-14 DIAGNOSIS — Z780 Asymptomatic menopausal state: Secondary | ICD-10-CM

## 2014-03-14 MED ORDER — ESTRADIOL 2 MG PO TABS: ORAL_TABLET | ORAL | Status: DC

## 2014-03-14 NOTE — Progress Notes (Signed)
Subjective:    Jacqueline Waters is a 58 y.o. M AA G44P3(34,28,25 yo kids and 2 grands)  female who presents for an annual exam. She would like a refill of her ERT. Her complaint today is that of neck, shoulder, and back pain due to her large breasts. She would like a breast reduction. The patient is sexually active. GYN screening history: last pap: was normal. The patient wears seatbelts: yes. The patient participates in regular exercise: yes. Has the patient ever been transfused or tattooed?: no. The patient reports that there is not domestic violence in her life.   Menstrual History: OB History   Grav Para Term Preterm Abortions TAB SAB Ect Mult Living   3 3        3       Menarche age: 43  No LMP recorded. Patient has had a hysterectomy.    The following portions of the patient's history were reviewed and updated as appropriate: allergies, current medications, past family history, past medical history, past social history, past surgical history and problem list.  Review of Systems A comprehensive review of systems was negative. Married for 30 years. She uses a lubricant prn. Works in the [employer] She will get her flu vaccine soon. Her mammogram and colonoscopy are UTD.    Objective:    BP 156/87  Pulse 52  Ht 5\' 2"  (1.575 m)  Wt 145 lb (65.772 kg)  BMI 26.51 kg/m2  General Appearance:    Alert, cooperative, no distress, appears stated age  Head:    Normocephalic, without obvious abnormality, atraumatic  Eyes:    PERRL, conjunctiva/corneas clear, EOM's intact, fundi    benign, both eyes  Ears:    Normal TM's and external ear canals, both ears  Nose:   Nares normal, septum midline, mucosa normal, no drainage    or sinus tenderness  Throat:   Lips, mucosa, and tongue normal; teeth and gums normal  Neck:   Supple, symmetrical, trachea midline, no adenopathy;    thyroid:  no enlargement/tenderness/nodules; no carotid   bruit or JVD  Back:      Symmetric, no curvature, ROM normal, no CVA tenderness  Lungs:     Clear to auscultation bilaterally, respirations unlabored  Chest Wall:    No tenderness or deformity   Heart:    Regular rate and rhythm, S1 and S2 normal, no murmur, rub   or gallop  Breast Exam:    No tenderness, masses, or nipple abnormality  Abdomen:     Soft, non-tender, bowel sounds active all four quadrants,    no masses, no organomegaly  Genitalia:    Normal female without lesion, discharge or tenderness     Extremities:   Extremities normal, atraumatic, no cyanosis or edema  Pulses:   2+ and symmetric all extremities  Skin:   Skin color, texture, turgor normal, no rashes or lesions  Lymph nodes:   Cervical, supraclavicular, and axillary nodes normal  Neurologic:   CNII-XII intact, normal strength, sensation and reflexes    throughout  .    Assessment:    Healthy female exam.    Plan:     Breast self exam technique reviewed and patient encouraged to perform self-exam monthly.

## 2014-04-09 ENCOUNTER — Ambulatory Visit (INDEPENDENT_AMBULATORY_CARE_PROVIDER_SITE_OTHER): Payer: 59 | Admitting: Family Medicine

## 2014-04-09 ENCOUNTER — Encounter (INDEPENDENT_AMBULATORY_CARE_PROVIDER_SITE_OTHER): Payer: Self-pay

## 2014-04-09 ENCOUNTER — Encounter: Payer: Self-pay | Admitting: Family Medicine

## 2014-04-09 VITALS — BP 140/86 | HR 60 | Temp 97.7°F | Ht 62.0 in | Wt 144.8 lb

## 2014-04-09 DIAGNOSIS — E7800 Pure hypercholesterolemia, unspecified: Secondary | ICD-10-CM

## 2014-04-09 DIAGNOSIS — I10 Essential (primary) hypertension: Secondary | ICD-10-CM

## 2014-04-09 DIAGNOSIS — M810 Age-related osteoporosis without current pathological fracture: Secondary | ICD-10-CM

## 2014-04-09 DIAGNOSIS — E039 Hypothyroidism, unspecified: Secondary | ICD-10-CM

## 2014-04-09 DIAGNOSIS — E559 Vitamin D deficiency, unspecified: Secondary | ICD-10-CM

## 2014-04-09 NOTE — Assessment & Plan Note (Signed)
Check vit D next lab visit.

## 2014-04-09 NOTE — Assessment & Plan Note (Signed)
Continue lipitor daily. Check FLP when she returns fasting.

## 2014-04-09 NOTE — Assessment & Plan Note (Signed)
Chronic issue. fmhx CAD. Denies personal history CAD so removed from [place]. Discussed low salt, increased potassium in diet, Chronically hypokalemic - check renin/aldosterone axis Continue current regimen, may need change in treatment plan.

## 2014-04-09 NOTE — Assessment & Plan Note (Signed)
Unclear dx - pt states she's not had DEXA in past.

## 2014-04-09 NOTE — Patient Instructions (Addendum)
Keep an eye on blood pressure at home. Let me know if bp starts creeping up consistently >140/90. No changes to blood pressure medicines today. Return at your convenience fasting for blood work one morning (check cholesterol and potassium). Return in 4-6 months for physical. Good to see you today, call us with quesitons.   DASH Eating Plan DASH stands for "Dietary Approaches to Stop Hypertension." The DASH eating plan is a healthy eating plan that has been shown to reduce high blood pressure (hypertension). Additional health benefits may include reducing the risk of type 2 diabetes mellitus, heart disease, and stroke. The DASH eating plan may also help with weight loss. WHAT DO I NEED TO KNOW ABOUT THE DASH EATING PLAN? For the DASH eating plan, you will follow these general guidelines:  Choose foods with a percent daily value for sodium of less than 5% (as listed on the food label).  Use salt-free seasonings or herbs instead of table salt or sea salt.  Check with your health care provider or pharmacist before using salt substitutes.  Eat lower-sodium products, often labeled as "lower sodium" or "no salt added."  Eat fresh foods.  Eat more vegetables, fruits, and low-fat dairy products.  Choose whole grains. Look for the word "whole" as the first word in the ingredient list.  Choose fish and skinless chicken or [place] more often than red meat. Limit fish, poultry, and meat to 6 oz (170 g) each day.  Limit sweets, desserts, sugars, and sugary drinks.  Choose heart-healthy fats.  Limit cheese to 1 oz (28 g) per day.  Eat more home-cooked food and less restaurant, buffet, and fast food.  Limit fried foods.  Cook foods using methods other than frying.  Limit canned vegetables. If you do use them, rinse them well to decrease the sodium.  When eating at a restaurant, ask that your food be prepared with less salt, or no salt if possible. WHAT FOODS CAN I EAT? Seek help from a  dietitian for individual calorie needs. Grains Whole grain or whole wheat bread. Brown rice. Whole grain or whole wheat pasta. Quinoa, bulgur, and whole grain cereals. Low-sodium cereals. Corn or whole wheat flour tortillas. Whole grain cornbread. Whole grain crackers. Low-sodium crackers. Vegetables Fresh or frozen vegetables (raw, steamed, roasted, or grilled). Low-sodium or reduced-sodium tomato and vegetable juices. Low-sodium or reduced-sodium tomato sauce and paste. Low-sodium or reduced-sodium canned vegetables.  Fruits All fresh, canned (in natural juice), or frozen fruits. Meat and Other Protein Products Ground beef (85% or leaner), grass-fed beef, or beef trimmed of fat. Skinless chicken or [place]. Ground chicken or [place]. Pork trimmed of fat. All fish and seafood. Eggs. Dried beans, peas, or lentils. Unsalted nuts and seeds. Unsalted canned beans. Dairy Low-fat dairy products, such as skim or 1% milk, 2% or reduced-fat cheeses, low-fat ricotta or cottage cheese, or plain low-fat yogurt. Low-sodium or reduced-sodium cheeses. Fats and Oils Tub margarines without trans fats. Light or reduced-fat mayonnaise and salad dressings (reduced sodium). Avocado. Safflower, olive, or canola oils. Natural peanut or almond butter. Other Unsalted popcorn and pretzels. The items listed above may not be a complete list of recommended foods or beverages. Contact your dietitian for more options. WHAT FOODS ARE NOT RECOMMENDED? Grains White bread. White pasta. White rice. Refined cornbread. Bagels and croissants. Crackers that contain trans fat. Vegetables Creamed or fried vegetables. Vegetables in a cheese sauce. Regular canned vegetables. Regular canned tomato sauce and paste. Regular tomato and vegetable juices. Fruits Dried fruits. Canned  fruit in light or heavy syrup. Fruit juice. Meat and Other Protein Products Fatty cuts of meat. Ribs, chicken wings, bacon, sausage, bologna, salami,  chitterlings, fatback, hot dogs, bratwurst, and packaged luncheon meats. Salted nuts and seeds. Canned beans with salt. Dairy Whole or 2% milk, cream, half-and-half, and cream cheese. Whole-fat or sweetened yogurt. Full-fat cheeses or blue cheese. Nondairy creamers and whipped toppings. Processed cheese, cheese spreads, or cheese curds. Condiments Onion and garlic salt, seasoned salt, table salt, and sea salt. Canned and packaged gravies. Worcestershire sauce. Tartar sauce. Barbecue sauce. Teriyaki sauce. Soy sauce, including reduced sodium. Steak sauce. Fish sauce. Oyster sauce. Cocktail sauce. Horseradish. Ketchup and mustard. Meat flavorings and tenderizers. Bouillon cubes. Hot sauce. Tabasco sauce. Marinades. Taco seasonings. Relishes. Fats and Oils Butter, stick margarine, lard, shortening, ghee, and bacon fat. Coconut, palm kernel, or palm oils. Regular salad dressings. Other Pickles and olives. Salted popcorn and pretzels. The items listed above may not be a complete list of foods and beverages to avoid. Contact your dietitian for more information. WHERE CAN I FIND MORE INFORMATION? National Heart, Lung, and Blood Institute: travelstabloid.com Document Released: 05/21/2011 Document Revised: 10/16/2013 Document Reviewed: 04/05/2013 [facility] Patient Information 2015 [place], [place]. This information is not intended to replace advice given to you by your health care provider. Make sure you discuss any questions you have with your health care provider.

## 2014-04-09 NOTE — Progress Notes (Signed)
Pre visit review using our clinic review tool, if applicable. No additional management support is needed unless otherwise documented below in the visit note. 

## 2014-04-09 NOTE — Assessment & Plan Note (Signed)
?  dx - check TSH and if normal will remove from problem list.

## 2014-04-09 NOTE — Progress Notes (Signed)
BP 140/86  Pulse 60  Temp(Src) 97.7 F (36.5 C) (Oral)  Ht 5\' 2"  (1.575 m)  Wt 144 lb 12 oz (65.658 kg)  BMI 26.47 kg/m2   CC: new pt to establish  Subjective:    Patient ID: Jacqueline Waters, female    DOB: [birth year], 57 y.o.   MRN: [id number]  HPI: Jacqueline Waters is a 59 y.o. female presenting on 04/09/2014 for Establish Care   Transfer of care from Dr. Lenna Gilford.  HTN - checks at home - 784 systolic. Compliant with lotrel 10/20 once daily, bisoprolol 5mg  daily, lasix 20mg  daily and Kdur 40mEq daily. No HA, vision changes, CP/tightness, SOB, leg swelling. Chronically hypokalemic worse since lasix over the last year. Prior on micardis 2/2 uncontrolled blood pressure.   Occasional night time leg cramping, not very frequent. Never trouble during the day.  HLD - compliant with lipitor 20mg  daily.  Family Hx of CAD (ICD-414.00) - she had a Cardiolite in 2001= normal without ischemia and EF=73%...  ~ 5/13: She is active, exercises by walking & running, no CP or exertional symptoms  Preventative: Last CPE 07/2013 Well woman with OBGYN Dr. Hulan Fray Flu shot at work  Lives with husband Grown children Occupation: site Freight forwarder at [employer]: walks regularly (walk/run program) Diet: good water, fruits/vegetables daily  Relevant past medical, surgical, family and social history reviewed and updated as indicated.  Allergies and medications reviewed and updated. Current Outpatient Prescriptions on File Prior to Visit  Medication Sig  . amLODipine-benazepril (LOTREL) 10-20 MG per capsule Take 1 capsule by mouth daily.  [name] atorvastatin (LIPITOR) 20 MG tablet Take 1 tablet (20 mg total) by mouth every morning.  . bisoprolol (ZEBETA) 5 MG tablet TAKE 1 TABLET BY MOUTH DAILY.  . calcium carbonate (OS-CAL) 600 MG TABS tablet Take 600 mg by mouth daily with breakfast.  . cholecalciferol (VITAMIN D) 1000 UNITS tablet Take 1,000 Units by mouth daily.  [name] estradiol (ESTRACE) 2 MG  tablet TAKE 1 TABLET BY MOUTH DAILY  . furosemide (LASIX) 20 MG tablet TAKE 1 TABLET BY MOUTH ONCE DAILY  . potassium chloride SA (KLOR-CON M20) 20 MEQ tablet TAKE 2 TABLETs BY MOUTH  DAILY.  . Multiple Vitamins-Minerals (CENTRUM PO) Take 1 tablet by mouth daily.   No current facility-administered medications on file prior to visit.   Past Medical History  Diagnosis Date  . Essential hypertension   . HLD (hyperlipidemia)   . Breast cyst   . Vaginal delivery     x3  . VITAMIN D DEFICIENCY 07/16/2009    Qualifier: Diagnosis of  By: Royal Piedra NP, Tammy      Review of Systems Per HPI unless specifically indicated above    Objective:    BP 140/86  Pulse 60  Temp(Src) 97.7 F (36.5 C) (Oral)  Ht 5\' 2"  (1.575 m)  Wt 144 lb 12 oz (65.658 kg)  BMI 26.47 kg/m2  Physical Exam  Nursing note and vitals reviewed. Constitutional: She appears well-developed and well-nourished. No distress.  HENT:  Mouth/Throat: Oropharynx is clear and moist. No oropharyngeal exudate.  Eyes: Conjunctivae and EOM are normal. Pupils are equal, round, and reactive to light. No scleral icterus.  Neck: Normal range of motion. Neck supple. No thyromegaly present.  Cardiovascular: Normal rate, regular rhythm, normal heart sounds and intact distal pulses.   No murmur heard. Pulmonary/Chest: Effort normal and breath sounds normal. No respiratory distress. She has no wheezes. She has no rales.  Musculoskeletal: She  exhibits no edema.  Lymphadenopathy:    She has no cervical adenopathy.  Skin: Skin is warm and dry. No rash noted.  Psychiatric: She has a normal mood and affect.       Assessment & Plan:   Problem List Items Addressed This Visit   Vitamin D deficiency     Check vit D next lab visit.    Osteoporosis     Unclear dx - pt states she's not had DEXA in past.    Hypothyroidism     ?dx - check TSH and if normal will remove from problem list.    HYPERCHOLESTEROLEMIA     Continue lipitor daily. Check  FLP when she returns fasting.    Essential hypertension - Primary     Chronic issue. fmhx CAD. Denies personal history CAD so removed from [place]. Discussed low salt, increased potassium in diet, Chronically hypokalemic - check renin/aldosterone axis Continue current regimen, may need change in treatment plan.        Follow up plan: Return in about 4 months (around 08/10/2014), or as needed, for annual exam, prior fasting for blood work.

## 2014-04-10 ENCOUNTER — Telehealth: Payer: Self-pay | Admitting: Family Medicine

## 2014-04-10 NOTE — Telephone Encounter (Signed)
emmi emailed °

## 2014-04-16 ENCOUNTER — Encounter: Payer: Self-pay | Admitting: Family Medicine

## 2014-07-23 ENCOUNTER — Other Ambulatory Visit: Payer: Self-pay

## 2014-07-23 DIAGNOSIS — Z1231 Encounter for screening mammogram for malignant neoplasm of breast: Secondary | ICD-10-CM

## 2014-08-23 ENCOUNTER — Other Ambulatory Visit: Payer: Self-pay | Admitting: Pulmonary Disease

## 2014-08-24 ENCOUNTER — Other Ambulatory Visit: Payer: Self-pay | Admitting: *Deleted

## 2014-08-24 ENCOUNTER — Ambulatory Visit: Payer: 59

## 2014-08-24 MED ORDER — AMLODIPINE BESY-BENAZEPRIL HCL 10-20 MG PO CAPS: 1.0000 | ORAL_CAPSULE | Freq: Every day | ORAL | Status: DC

## 2014-08-24 NOTE — Telephone Encounter (Signed)
Pt called to ck on amlodipine benzapril refill;spoke with betty at [facility] and pharmacist is looking at refill now. Pt notified refill sent to [facility] out[facility] phar.

## 2014-09-06 ENCOUNTER — Other Ambulatory Visit: Payer: Self-pay | Admitting: *Deleted

## 2014-09-06 MED ORDER — FUROSEMIDE 20 MG PO TABS: ORAL_TABLET | ORAL | Status: DC

## 2014-09-06 MED ORDER — ATORVASTATIN CALCIUM 20 MG PO TABS: 20.0000 mg | ORAL_TABLET | Freq: Every morning | ORAL | Status: DC

## 2014-10-25 ENCOUNTER — Telehealth: Payer: Self-pay | Admitting: Family Medicine

## 2014-10-25 DIAGNOSIS — Z0000 Encounter for general adult medical examination without abnormal findings: Secondary | ICD-10-CM

## 2014-10-25 MED ORDER — BISOPROLOL FUMARATE 5 MG PO TABS: ORAL_TABLET | ORAL | Status: DC

## 2014-10-25 NOTE — Telephone Encounter (Signed)
Refilled

## 2014-10-25 NOTE — Telephone Encounter (Signed)
Pt came in needing to get a rx refilled Bisoprolol fumarate 5mg  ta Take 1 tablet by mouth daily 90 day supply  [facility] pharamcy  Pt is completely out of meds

## 2014-10-25 NOTE — Telephone Encounter (Signed)
Pt was seen 03/2014 and noted may need to change treatment plan under hypertension note; pt request new rx bisoprolol to [facility].Please advise.

## 2014-11-30 ENCOUNTER — Other Ambulatory Visit (INDEPENDENT_AMBULATORY_CARE_PROVIDER_SITE_OTHER): Payer: 59

## 2014-11-30 DIAGNOSIS — E7800 Pure hypercholesterolemia, unspecified: Secondary | ICD-10-CM

## 2014-11-30 DIAGNOSIS — I10 Essential (primary) hypertension: Secondary | ICD-10-CM

## 2014-11-30 DIAGNOSIS — E559 Vitamin D deficiency, unspecified: Secondary | ICD-10-CM | POA: Diagnosis not present

## 2014-11-30 DIAGNOSIS — E039 Hypothyroidism, unspecified: Secondary | ICD-10-CM | POA: Diagnosis not present

## 2014-11-30 LAB — COMPREHENSIVE METABOLIC PANEL
ALT: 12 U/L (ref 0–35)
AST: 16 U/L (ref 0–37)
Albumin: 3.9 g/dL (ref 3.5–5.2)
Alkaline Phosphatase: 54 U/L (ref 39–117)
BUN: 10 mg/dL (ref 6–23)
CO2: 30 mEq/L (ref 19–32)
Calcium: 9.1 mg/dL (ref 8.4–10.5)
Chloride: 105 mEq/L (ref 96–112)
Creatinine, Ser: 0.74 mg/dL (ref 0.40–1.20)
GFR: 103.74 mL/min (ref >60.00)
Glucose, Bld: 85 mg/dL (ref 70–99)
Potassium: 3.2 mEq/L — ABNORMAL LOW (ref 3.5–5.1)
Sodium: 139 mEq/L (ref 135–145)
Total Bilirubin: 0.4 mg/dL (ref 0.2–1.2)
Total Protein: 7 g/dL (ref 6.0–8.3)

## 2014-11-30 LAB — LIPID PANEL
Cholesterol: 155 mg/dL (ref 0–200)
HDL: 49.1 mg/dL (ref >39.00)
LDL Cholesterol: 97 mg/dL (ref 0–99)
NonHDL: 105.9
Total CHOL/HDL Ratio: 3
Triglycerides: 47 mg/dL (ref 0.0–149.0)
VLDL: 9.4 mg/dL (ref 0.0–40.0)

## 2014-11-30 LAB — TSH: TSH: 3.7 u[IU]/mL (ref 0.35–4.50)

## 2014-11-30 LAB — VITAMIN D 25 HYDROXY (VIT D DEFICIENCY, FRACTURES): VITD: 21.67 ng/mL — ABNORMAL LOW (ref 30.00–100.00)

## 2014-12-05 ENCOUNTER — Encounter: Payer: Self-pay | Admitting: Family Medicine

## 2014-12-05 ENCOUNTER — Ambulatory Visit (INDEPENDENT_AMBULATORY_CARE_PROVIDER_SITE_OTHER): Payer: 59 | Admitting: Family Medicine

## 2014-12-05 VITALS — BP 150/90 | HR 63 | Temp 98.3°F | Ht 62.33 in | Wt 144.0 lb

## 2014-12-05 DIAGNOSIS — E559 Vitamin D deficiency, unspecified: Secondary | ICD-10-CM

## 2014-12-05 DIAGNOSIS — Z0000 Encounter for general adult medical examination without abnormal findings: Secondary | ICD-10-CM | POA: Insufficient documentation

## 2014-12-05 DIAGNOSIS — E7800 Pure hypercholesterolemia, unspecified: Secondary | ICD-10-CM

## 2014-12-05 DIAGNOSIS — I10 Essential (primary) hypertension: Secondary | ICD-10-CM | POA: Diagnosis not present

## 2014-12-05 MED ORDER — VITAMIN D 50 MCG (2000 UT) PO TABS: 1000.0000 [IU] | ORAL_TABLET | Freq: Every day | ORAL | Status: DC

## 2014-12-05 MED ORDER — SPIRONOLACTONE 25 MG PO TABS: 25.0000 mg | ORAL_TABLET | Freq: Every day | ORAL | Status: DC

## 2014-12-05 NOTE — Assessment & Plan Note (Signed)
Increase vit D to 2000 IU daily.  

## 2014-12-05 NOTE — Assessment & Plan Note (Signed)
Preventative protocols reviewed and updated unless pt declined. Discussed healthy diet and lifestyle.  

## 2014-12-05 NOTE — Progress Notes (Signed)
Pre visit review using our clinic review tool, if applicable. No additional management support is needed unless otherwise documented below in the visit note. 

## 2014-12-05 NOTE — Assessment & Plan Note (Addendum)
Chronic, slightly elevated. Compliant with meds. recheck today in office even higher.  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 Aldosterone levels pending. Start spironolactone 25mg  daily and pt will return in 1 mo for f/u visit. Pt will monitor blood pressures at home and notify me if any questions in the interim. Decrease potassium to 71mEq 1 tab daily.

## 2014-12-05 NOTE — Patient Instructions (Addendum)
Call to schedule mammogram. Let's change lasix to as needed for leg swelling, not every day.  Continue potassium 60mEq but just 1 tablet daily. Start spironolactone 25mg  daily. Monitor blood pressures on these changes. Increase vitamin D to 20000 units daily. Return in 1 year for next physical, sooner if needed (if blood pressures remaining elevated). Return in 1 month for blood pressure follow up.

## 2014-12-05 NOTE — Assessment & Plan Note (Signed)
Complaint with lipitor - good control

## 2014-12-05 NOTE — Progress Notes (Signed)
BP 150/90 mmHg  Pulse 63  Temp(Src) 98.3 F (36.8 C) (Oral)  Ht 5' 2.33" (1.583 m)  Wt 144 lb (65.318 kg)  BMI 26.07 kg/m2  SpO2 99%   CC: CPE  Subjective:    Patient ID: Jacqueline Waters, female    DOB: 04/09/1956, 59 y.o.   MRN: [id number]  HPI: Jacqueline Waters is a 59 y.o. female presenting on 12/05/2014 for Annual Exam   HTN - bp elevated today. Compliant with bisoprolol, lotrel and lasix. On lasix for leg swelling.   HLD - lipitor 20mg  daily without myalgias.   Preventative: COLONOSCOPY Date: 2010 WNL Olevia Perches) Well woman with OBGYN Dr. Hulan Fray last 03/2014.  Mammo - due. Will schedule.  Flu shot at work Tdap 2014 Seat belt use discussed No changing moles on skin.  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 Interim medical history since our last visit reviewed. Allergies and medications reviewed and updated. Current Outpatient Prescriptions on File Prior to Visit  Medication Sig  . amLODipine-benazepril (LOTREL) 10-20 MG per capsule Take 1 capsule by mouth daily.  [name] atorvastatin (LIPITOR) 20 MG tablet Take 1 tablet (20 mg total) by mouth every morning.  [name] BIOTIN PO Take 1 tablet by mouth daily.  . bisoprolol (ZEBETA) 5 MG tablet TAKE 1 TABLET BY MOUTH DAILY.  . calcium carbonate (OS-CAL) 600 MG TABS tablet Take 600 mg by mouth daily with breakfast.  . estradiol (ESTRACE) 2 MG tablet TAKE 1 TABLET BY MOUTH DAILY  . Multiple Vitamins-Minerals (CENTRUM PO) Take 1 tablet by mouth daily.   No current facility-administered medications on file prior to visit.    Review of Systems  Constitutional: Negative for fever, chills, activity change, appetite change, fatigue and unexpected weight change.  HENT: Negative for hearing loss.   Eyes: Negative for  visual disturbance.  Respiratory: Negative for cough, chest tightness, shortness of breath and wheezing.   Cardiovascular: Negative for chest pain, palpitations and leg swelling.  Gastrointestinal: Negative for nausea, vomiting, abdominal pain, diarrhea, constipation, blood in stool and abdominal distention.  Genitourinary: Negative for hematuria and difficulty urinating.  Musculoskeletal: Negative for myalgias, arthralgias and neck pain.  Skin: Negative for rash.  Neurological: Negative for dizziness, seizures, syncope and headaches.  Hematological: Negative for adenopathy. Does not bruise/bleed easily.  Psychiatric/Behavioral: Negative for dysphoric mood. The patient is not nervous/anxious.    Per HPI unless specifically indicated above     Objective:    BP 150/90 mmHg  Pulse 63  Temp(Src) 98.3 F (36.8 C) (Oral)  Ht 5' 2.33" (1.583 m)  Wt 144 lb (65.318 kg)  BMI 26.07 kg/m2  SpO2 99%  Wt Readings from Last 3 Encounters:  12/05/14 144 lb (65.318 kg)  04/09/14 144 lb 12 oz (65.658 kg)  03/14/14 145 lb (65.772 kg)    Physical Exam  Constitutional: She is oriented to person, place, and time. She appears well-developed and well-nourished. No distress.  HENT:  Head: Normocephalic and atraumatic.  Right Ear: Hearing, tympanic membrane, external ear and ear canal normal.  Left Ear: Hearing, tympanic membrane, external ear and ear canal normal.  Nose: Nose normal.  Mouth/Throat: Uvula is midline, oropharynx is clear and moist and mucous membranes are normal. No oropharyngeal exudate, posterior oropharyngeal edema or posterior oropharyngeal erythema.  Eyes: Conjunctivae and EOM are normal. Pupils  are equal, round, and reactive to light. No scleral icterus.  Neck: Normal range of motion. Neck supple. No thyromegaly present.  Cardiovascular: Normal rate, regular rhythm, normal heart sounds and intact distal pulses.   No murmur heard. Pulses:      Radial pulses are 2+ on the right  side, and 2+ on the left side.  Pulmonary/Chest: Effort normal and breath sounds normal. No respiratory distress. She has no wheezes. She has no rales.  Abdominal: Soft. Bowel sounds are normal. She exhibits no distension and no mass. There is no tenderness. There is no rebound and no guarding.  Musculoskeletal: Normal range of motion. She exhibits edema (tr).  Lymphadenopathy:    She has no cervical adenopathy.  Neurological: She is alert and oriented to person, place, and time.  CN grossly intact, station and gait intact  Skin: Skin is warm and dry. No rash noted.  Psychiatric: She has a normal mood and affect. Her behavior is normal. Judgment and thought content normal.  Nursing note and vitals reviewed.  Results for orders placed or performed in visit on 11/30/14  TSH  Result Value Ref Range   TSH 3.70 0.35 - 4.50 uIU/mL  Vit D  25 hydroxy (rtn osteoporosis monitoring)  Result Value Ref Range   VITD 21.67 (L) 30.00 - 100.00 ng/mL  Comprehensive metabolic panel  Result Value Ref Range   Sodium 139 135 - 145 mEq/L   Potassium 3.2 (L) 3.5 - 5.1 mEq/L   Chloride 105 96 - 112 mEq/L   CO2 30 19 - 32 mEq/L   Glucose, Bld 85 70 - 99 mg/dL   BUN 10 6 - 23 mg/dL   Creatinine, Ser 0.74 0.40 - 1.20 mg/dL   Total Bilirubin 0.4 0.2 - 1.2 mg/dL   Alkaline Phosphatase 54 39 - 117 U/L   AST 16 0 - 37 U/L   ALT 12 0 - 35 U/L   Total Protein 7.0 6.0 - 8.3 g/dL   Albumin 3.9 3.5 - 5.2 g/dL   Calcium 9.1 8.4 - 10.5 mg/dL   GFR 103.74 >60.00 mL/min  Lipid panel  Result Value Ref Range   Cholesterol 155 0 - 200 mg/dL   Triglycerides 47.0 0.0 - 149.0 mg/dL   HDL 49.10 >39.00 mg/dL   VLDL 9.4 0.0 - 40.0 mg/dL   LDL Cholesterol 97 0 - 99 mg/dL   Total CHOL/HDL Ratio 3    NonHDL 105.90       Assessment & Plan:  Denies h/o hypothyroidism or osteoporosis. Removed from problem list. Problem List Items Addressed This Visit    Essential hypertension    Chronic, slightly elevated. Compliant  with meds. recheck today in office even higher.  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 Aldosterone levels pending. Start spironolactone 25mg  daily and pt will return in 1 mo for f/u visit. Pt will monitor blood pressures at home and notify me if any questions in the interim. Decrease potassium to 62mEq 1 tab daily.      Relevant Medications   furosemide (LASIX) 20 MG tablet   spironolactone (ALDACTONE) 25 MG tablet   Health maintenance examination - Primary    Preventative protocols reviewed and updated unless pt declined. Discussed healthy diet and lifestyle.       HYPERCHOLESTEROLEMIA    Complaint with lipitor - good control      Relevant Medications   furosemide (LASIX) 20 MG tablet   spironolactone (ALDACTONE) 25 MG tablet   Vitamin D deficiency  Increase vit D to 2000 IU daily.          Follow up plan: Return in about 1 month (around 01/04/2015), or as needed , for follow up visit.

## 2014-12-07 LAB — ALDOSTERONE + RENIN ACTIVITY W/ RATIO
ALDO / PRA Ratio: 92.9 Ratio — ABNORMAL HIGH (ref 0.9–28.9)
Aldosterone: 13 ng/dL
PRA LC/MS/MS: 0.14 ng/mL/h — ABNORMAL LOW (ref 0.25–5.82)

## 2015-01-01 ENCOUNTER — Other Ambulatory Visit: Payer: Self-pay | Admitting: Pulmonary Disease

## 2015-01-03 ENCOUNTER — Other Ambulatory Visit: Payer: Self-pay

## 2015-01-03 ENCOUNTER — Other Ambulatory Visit: Payer: Self-pay | Admitting: Pulmonary Disease

## 2015-01-03 MED ORDER — POTASSIUM CHLORIDE CRYS ER 20 MEQ PO TBCR: EXTENDED_RELEASE_TABLET | ORAL | Status: DC

## 2015-01-16 ENCOUNTER — Ambulatory Visit (INDEPENDENT_AMBULATORY_CARE_PROVIDER_SITE_OTHER): Payer: 59 | Admitting: Family Medicine

## 2015-01-16 ENCOUNTER — Encounter: Payer: Self-pay | Admitting: Family Medicine

## 2015-01-16 ENCOUNTER — Other Ambulatory Visit: Payer: Self-pay | Admitting: Family Medicine

## 2015-01-16 VITALS — BP 140/84 | HR 76 | Temp 98.0°F | Wt 145.5 lb

## 2015-01-16 DIAGNOSIS — I10 Essential (primary) hypertension: Secondary | ICD-10-CM | POA: Diagnosis not present

## 2015-01-16 DIAGNOSIS — E7800 Pure hypercholesterolemia, unspecified: Secondary | ICD-10-CM

## 2015-01-16 DIAGNOSIS — E559 Vitamin D deficiency, unspecified: Secondary | ICD-10-CM | POA: Diagnosis not present

## 2015-01-16 LAB — BASIC METABOLIC PANEL
BUN: 18 mg/dL (ref 6–23)
CO2: 28 mEq/L (ref 19–32)
Calcium: 9.9 mg/dL (ref 8.4–10.5)
Chloride: 103 mEq/L (ref 96–112)
Creatinine, Ser: 0.87 mg/dL (ref 0.40–1.20)
GFR: 86.03 mL/min (ref >60.00)
Glucose, Bld: 88 mg/dL (ref 70–99)
Potassium: 4.7 mEq/L (ref 3.5–5.1)
Sodium: 136 mEq/L (ref 135–145)

## 2015-01-16 MED ORDER — VITAMIN D 50 MCG (2000 UT) PO TABS: 2000.0000 [IU] | ORAL_TABLET | Freq: Every day | ORAL | Status: DC

## 2015-01-16 NOTE — Progress Notes (Signed)
BP 140/84 mmHg  Pulse 76  Temp(Src) 98 F (36.7 C) (Oral)  Wt 145 lb 8 oz (65.998 kg)   CC: f/u visit  Subjective:    Patient ID: Jacqueline Waters, female    DOB: 06/06/1956, 59 y.o.   MRN: [id number]  HPI: Jacqueline Waters is a 59 y.o. female presenting on 01/16/2015 for Follow-up   HTN - Compliant with current antihypertensive regimen of lotrel 10/20 daily, bisoprolol 5mg  daily, lasix 20mg  daily PRN. Also on potassium 63mEq daily. Last visit (6/22) we started spironolactone 25mg  daily.  Does check blood pressures at home: better than today. Denies HA, vision changes, CP/tightness, SOB, leg swelling.  Initially some dizziness on spironolactone, now better.   Relevant past medical, surgical, family and social history reviewed and updated as indicated. Interim medical history since our last visit reviewed. Allergies and medications reviewed and updated. Current Outpatient Prescriptions on File Prior to Visit  Medication Sig  . amLODipine-benazepril (LOTREL) 10-20 MG per capsule Take 1 capsule by mouth daily.  [name] atorvastatin (LIPITOR) 20 MG tablet Take 1 tablet (20 mg total) by mouth every morning.  [name] BIOTIN PO Take 1 tablet by mouth daily.  . bisoprolol (ZEBETA) 5 MG tablet TAKE 1 TABLET BY MOUTH DAILY.  . calcium carbonate (OS-CAL) 600 MG TABS tablet Take 600 mg by mouth daily with breakfast.  . estradiol (ESTRACE) 2 MG tablet TAKE 1 TABLET BY MOUTH DAILY  . furosemide (LASIX) 20 MG tablet TAKE 1 TABLET BY MOUTH ONCE DAILY AS NEEDED FOR LEG SWELLING  . Multiple Vitamins-Minerals (CENTRUM PO) Take 1 tablet by mouth daily.  . potassium chloride SA (KLOR-CON M20) 20 MEQ tablet TAKE 1 TABLET BY MOUTH  DAILY.  [name] spironolactone (ALDACTONE) 25 MG tablet Take 1 tablet (25 mg total) by mouth daily.   No current facility-administered medications on file prior to visit.    Review of Systems Per HPI unless specifically indicated above     Objective:    BP 140/84 mmHg  Pulse 76  Temp(Src)  98 F (36.7 C) (Oral)  Wt 145 lb 8 oz (65.998 kg)  Wt Readings from Last 3 Encounters:  01/16/15 145 lb 8 oz (65.998 kg)  12/05/14 144 lb (65.318 kg)  04/09/14 144 lb 12 oz (65.658 kg)    Physical Exam  Constitutional: She appears well-developed and well-nourished. No distress.  HENT:  Mouth/Throat: Oropharynx is clear and moist. No oropharyngeal exudate.  Cardiovascular: Normal rate, regular rhythm, normal heart sounds and intact distal pulses.   No murmur heard. Pulmonary/Chest: Effort normal and breath sounds normal. No respiratory distress. She has no wheezes. She has no rales.  Musculoskeletal: She exhibits no edema.  Skin: Skin is warm and dry. No rash noted.  Psychiatric: She has a normal mood and affect.  Nursing note and vitals reviewed.  Results for orders placed or performed in visit on 11/30/14  TSH  Result Value Ref Range   TSH 3.70 0.35 - 4.50 uIU/mL  Vit D  25 hydroxy (rtn osteoporosis monitoring)  Result Value Ref Range   VITD 21.67 (L) 30.00 - 100.00 ng/mL  Aldosterone + renin activity w/ ratio  Result Value Ref Range   PRA LC/MS/MS 0.14 (L) 0.25 - 5.82 ng/mL/h   ALDO / PRA Ratio 92.9 (H) 0.9 - 28.9 Ratio   Aldosterone 13 ng/dL  Comprehensive metabolic panel  Result Value Ref Range   Sodium 139 135 - 145 mEq/L   Potassium 3.2 (L) 3.5 - 5.1 mEq/L  Chloride 105 96 - 112 mEq/L   CO2 30 19 - 32 mEq/L   Glucose, Bld 85 70 - 99 mg/dL   BUN 10 6 - 23 mg/dL   Creatinine, Ser 0.74 0.40 - 1.20 mg/dL   Total Bilirubin 0.4 0.2 - 1.2 mg/dL   Alkaline Phosphatase 54 39 - 117 U/L   AST 16 0 - 37 U/L   ALT 12 0 - 35 U/L   Total Protein 7.0 6.0 - 8.3 g/dL   Albumin 3.9 3.5 - 5.2 g/dL   Calcium 9.1 8.4 - 10.5 mg/dL   GFR 103.74 >60.00 mL/min  Lipid panel  Result Value Ref Range   Cholesterol 155 0 - 200 mg/dL   Triglycerides 47.0 0.0 - 149.0 mg/dL   HDL 49.10 >39.00 mg/dL   VLDL 9.4 0.0 - 40.0 mg/dL   LDL Cholesterol 97 0 - 99 mg/dL   Total CHOL/HDL Ratio 3     NonHDL 105.90       Assessment & Plan:   Problem List Items Addressed This Visit    Essential hypertension - Primary    Chronic, improvement noted with addition of spironolactone. Now tolerating meds well. Suggested change bisoprolol to nightly RTC 6 mo f/u visit.      Relevant Orders   Basic metabolic panel   HYPERCHOLESTEROLEMIA    Chronic, stable. Well controlled on lipitor. Continue.      Vitamin D deficiency    She has increased vit D to 2000 IU daily.          Follow up plan: Return in about 6 months (around 07/19/2015), or if symptoms worsen or fail to improve, for follow up visit.

## 2015-01-16 NOTE — Progress Notes (Signed)
Pre visit review using our clinic review tool, if applicable. No additional management support is needed unless otherwise documented below in the visit note. 

## 2015-01-16 NOTE — Assessment & Plan Note (Signed)
She has increased vit D to 2000 IU daily.

## 2015-01-16 NOTE — Patient Instructions (Addendum)
Continue medicines as up to now. Take spironolactone and lotrel in the morning, try bisoprolol (zebeta) at night time.  Return as needed or in 6 months for follow up.  labwork today.

## 2015-01-16 NOTE — Assessment & Plan Note (Signed)
Chronic, improvement noted with addition of spironolactone. Now tolerating meds well. Suggested change bisoprolol to nightly RTC 6 mo f/u visit.

## 2015-01-16 NOTE — Assessment & Plan Note (Signed)
Chronic, stable. Well controlled on lipitor. Continue.

## 2015-01-31 ENCOUNTER — Ambulatory Visit (INDEPENDENT_AMBULATORY_CARE_PROVIDER_SITE_OTHER): Payer: 59 | Admitting: Internal Medicine

## 2015-01-31 ENCOUNTER — Encounter: Payer: Self-pay | Admitting: Internal Medicine

## 2015-01-31 ENCOUNTER — Ambulatory Visit: Payer: 59 | Admitting: Internal Medicine

## 2015-01-31 VITALS — BP 144/78 | HR 62 | Temp 98.0°F | Wt 145.0 lb

## 2015-01-31 DIAGNOSIS — M62838 Other muscle spasm: Secondary | ICD-10-CM

## 2015-01-31 DIAGNOSIS — M6248 Contracture of muscle, other site: Secondary | ICD-10-CM

## 2015-01-31 DIAGNOSIS — M546 Pain in thoracic spine: Secondary | ICD-10-CM

## 2015-01-31 MED ORDER — METHOCARBAMOL 500 MG PO TABS: 500.0000 mg | ORAL_TABLET | Freq: Every evening | ORAL | Status: DC | PRN
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
## 2015-01-31 NOTE — Progress Notes (Signed)
Pre visit review using our clinic review tool, if applicable. No additional management support is needed unless otherwise documented below in the visit note. 

## 2015-01-31 NOTE — Patient Instructions (Signed)
Back Exercises These exercises may help you when beginning to rehabilitate your injury. Your symptoms may resolve with or without further involvement from your physician, physical therapist or athletic trainer. While completing these exercises, remember:   Restoring tissue flexibility helps normal motion to return to the joints. This allows healthier, less painful movement and activity.  An effective stretch should be held for at least 30 seconds.  A stretch should never be painful. You should only feel a gentle lengthening or release in the stretched tissue. STRETCH - Extension, Prone on Elbows   Lie on your stomach on the floor, a bed will be too soft. Place your palms about shoulder width apart and at the height of your head.  Place your elbows under your shoulders. If this is too painful, stack pillows under your chest.  Allow your body to relax so that your hips drop lower and make contact more completely with the floor.  Hold this position for __________ seconds.  Slowly return to lying flat on the floor. Repeat __________ times. Complete this exercise __________ times per day.  RANGE OF MOTION - Extension, Prone Press Ups   Lie on your stomach on the floor, a bed will be too soft. Place your palms about shoulder width apart and at the height of your head.  Keeping your back as relaxed as possible, slowly straighten your elbows while keeping your hips on the floor. You may adjust the placement of your hands to maximize your comfort. As you gain motion, your hands will come more underneath your shoulders.  Hold this position __________ seconds.  Slowly return to lying flat on the floor. Repeat __________ times. Complete this exercise __________ times per day.  RANGE OF MOTION- Quadruped, Neutral Spine   Assume a hands and knees position on a firm surface. Keep your hands under your shoulders and your knees under your hips. You may place padding under your knees for  comfort.  Drop your head and point your tail bone toward the ground below you. This will round out your low back like an angry cat. Hold this position for __________ seconds.  Slowly lift your head and release your tail bone so that your back sags into a large arch, like an old horse.  Hold this position for __________ seconds.  Repeat this until you feel limber in your low back.  Now, find your "sweet spot." This will be the most comfortable position somewhere between the two previous positions. This is your neutral spine. Once you have found this position, tense your stomach muscles to support your low back.  Hold this position for __________ seconds. Repeat __________ times. Complete this exercise __________ times per day.  STRETCH - Flexion, Single Knee to Chest   Lie on a firm bed or floor with both legs extended in front of you.  Keeping one leg in contact with the floor, bring your opposite knee to your chest. Hold your leg in place by either grabbing behind your thigh or at your knee.  Pull until you feel a gentle stretch in your low back. Hold __________ seconds.  Slowly release your grasp and repeat the exercise with the opposite side. Repeat __________ times. Complete this exercise __________ times per day.  STRETCH - Hamstrings, Standing  Stand or sit and extend your right / left leg, placing your foot on a chair or foot stool  Keeping a slight arch in your low back and your hips straight forward.  Lead with your chest and   lean forward at the waist until you feel a gentle stretch in the back of your right / left knee or thigh. (When done correctly, this exercise requires leaning only a small distance.)  Hold this position for __________ seconds. Repeat __________ times. Complete this stretch __________ times per day. STRENGTHENING - Deep Abdominals, Pelvic Tilt   Lie on a firm bed or floor. Keeping your legs in front of you, bend your knees so they are both pointed  toward the ceiling and your feet are flat on the floor.  Tense your lower abdominal muscles to press your low back into the floor. This motion will rotate your pelvis so that your tail bone is scooping upwards rather than pointing at your feet or into the floor.  With a gentle tension and even breathing, hold this position for __________ seconds. Repeat __________ times. Complete this exercise __________ times per day.  STRENGTHENING - Abdominals, Crunches   Lie on a firm bed or floor. Keeping your legs in front of you, bend your knees so they are both pointed toward the ceiling and your feet are flat on the floor. Cross your arms over your chest.  Slightly tip your chin down without bending your neck.  Tense your abdominals and slowly lift your trunk high enough to just clear your shoulder blades. Lifting higher can put excessive stress on the low back and does not further strengthen your abdominal muscles.  Control your return to the starting position. Repeat __________ times. Complete this exercise __________ times per day.  STRENGTHENING - Quadruped, Opposite UE/LE Lift   Assume a hands and knees position on a firm surface. Keep your hands under your shoulders and your knees under your hips. You may place padding under your knees for comfort.  Find your neutral spine and gently tense your abdominal muscles so that you can maintain this position. Your shoulders and hips should form a rectangle that is parallel with the floor and is not twisted.  Keeping your trunk steady, lift your right hand no higher than your shoulder and then your left leg no higher than your hip. Make sure you are not holding your breath. Hold this position __________ seconds.  Continuing to keep your abdominal muscles tense and your back steady, slowly return to your starting position. Repeat with the opposite arm and leg. Repeat __________ times. Complete this exercise __________ times per day. Document Released:  06/19/2005 Document Revised: 08/24/2011 Document Reviewed: 09/13/2008 [facility] Patient Information 2015 [place], [place]. This information is not intended to replace advice given to you by your health care provider. Make sure you discuss any questions you have with your health care provider. Back Exercises These exercises may help you when beginning to rehabilitate your injury. Your symptoms may resolve with or without further involvement from your physician, physical therapist or athletic trainer. While completing these exercises, remember:   Restoring tissue flexibility helps normal motion to return to the joints. This allows healthier, less painful movement and activity.  An effective stretch should be held for at least 30 seconds.  A stretch should never be painful. You should only feel a gentle lengthening or release in the stretched tissue. STRETCH - Extension, Prone on Elbows   Lie on your stomach on the floor, a bed will be too soft. Place your palms about shoulder width apart and at the height of your head.  Place your elbows under your shoulders. If this is too painful, stack pillows under your chest.  Allow your body  to relax so that your hips drop lower and make contact more completely with the floor.  Hold this position for __________ seconds.  Slowly return to lying flat on the floor. Repeat __________ times. Complete this exercise __________ times per day.  RANGE OF MOTION - Extension, Prone Press Ups   Lie on your stomach on the floor, a bed will be too soft. Place your palms about shoulder width apart and at the height of your head.  Keeping your back as relaxed as possible, slowly straighten your elbows while keeping your hips on the floor. You may adjust the placement of your hands to maximize your comfort. As you gain motion, your hands will come more underneath your shoulders.  Hold this position __________ seconds.  Slowly return to lying flat on the floor. Repeat  __________ times. Complete this exercise __________ times per day.  RANGE OF MOTION- Quadruped, Neutral Spine   Assume a hands and knees position on a firm surface. Keep your hands under your shoulders and your knees under your hips. You may place padding under your knees for comfort.  Drop your head and point your tail bone toward the ground below you. This will round out your low back like an angry cat. Hold this position for __________ seconds.  Slowly lift your head and release your tail bone so that your back sags into a large arch, like an old horse.  Hold this position for __________ seconds.  Repeat this until you feel limber in your low back.  Now, find your "sweet spot." This will be the most comfortable position somewhere between the two previous positions. This is your neutral spine. Once you have found this position, tense your stomach muscles to support your low back.  Hold this position for __________ seconds. Repeat __________ times. Complete this exercise __________ times per day.  STRETCH - Flexion, Single Knee to Chest   Lie on a firm bed or floor with both legs extended in front of you.  Keeping one leg in contact with the floor, bring your opposite knee to your chest. Hold your leg in place by either grabbing behind your thigh or at your knee.  Pull until you feel a gentle stretch in your low back. Hold __________ seconds.  Slowly release your grasp and repeat the exercise with the opposite side. Repeat __________ times. Complete this exercise __________ times per day.  STRETCH - Hamstrings, Standing  Stand or sit and extend your right / left leg, placing your foot on a chair or foot stool  Keeping a slight arch in your low back and your hips straight forward.  Lead with your chest and lean forward at the waist until you feel a gentle stretch in the back of your right / left knee or thigh. (When done correctly, this exercise requires leaning only a small  distance.)  Hold this position for __________ seconds. Repeat __________ times. Complete this stretch __________ times per day. STRENGTHENING - Deep Abdominals, Pelvic Tilt   Lie on a firm bed or floor. Keeping your legs in front of you, bend your knees so they are both pointed toward the ceiling and your feet are flat on the floor.  Tense your lower abdominal muscles to press your low back into the floor. This motion will rotate your pelvis so that your tail bone is scooping upwards rather than pointing at your feet or into the floor.  With a gentle tension and even breathing, hold this position for __________ seconds.  Repeat __________ times. Complete this exercise __________ times per day.  STRENGTHENING - Abdominals, Crunches   Lie on a firm bed or floor. Keeping your legs in front of you, bend your knees so they are both pointed toward the ceiling and your feet are flat on the floor. Cross your arms over your chest.  Slightly tip your chin down without bending your neck.  Tense your abdominals and slowly lift your trunk high enough to just clear your shoulder blades. Lifting higher can put excessive stress on the low back and does not further strengthen your abdominal muscles.  Control your return to the starting position. Repeat __________ times. Complete this exercise __________ times per day.  STRENGTHENING - Quadruped, Opposite UE/LE Lift   Assume a hands and knees position on a firm surface. Keep your hands under your shoulders and your knees under your hips. You may place padding under your knees for comfort.  Find your neutral spine and gently tense your abdominal muscles so that you can maintain this position. Your shoulders and hips should form a rectangle that is parallel with the floor and is not twisted.  Keeping your trunk steady, lift your right hand no higher than your shoulder and then your left leg no higher than your hip. Make sure you are not holding your breath.  Hold this position __________ seconds.  Continuing to keep your abdominal muscles tense and your back steady, slowly return to your starting position. Repeat with the opposite arm and leg. Repeat __________ times. Complete this exercise __________ times per day. Document Released: 06/19/2005 Document Revised: 08/24/2011 Document Reviewed: 09/13/2008 [facility] Patient Information 2015 [place], [place]. This information is not intended to replace advice given to you by your health care provider. Make sure you discuss any questions you have with your health care provider.

## 2015-02-11 ENCOUNTER — Encounter: Payer: Self-pay | Admitting: Family Medicine

## 2015-02-11 ENCOUNTER — Encounter: Payer: Self-pay | Admitting: Obstetrics & Gynecology

## 2015-02-12 NOTE — Telephone Encounter (Signed)
I think pt is asking for recs on massage parlor - which do you like Kim?

## 2015-04-03 ENCOUNTER — Other Ambulatory Visit: Payer: Self-pay | Admitting: Obstetrics & Gynecology

## 2015-04-30 ENCOUNTER — Other Ambulatory Visit: Payer: Self-pay | Admitting: Family Medicine

## 2015-06-20 MED FILL — BISOPROLOL FUMARATE 5 MG TA: 5 | 90 days supply | Qty: 90 | Fill #2

## 2015-07-16 DIAGNOSIS — H524 Presbyopia: Secondary | ICD-10-CM | POA: Diagnosis not present

## 2015-07-19 ENCOUNTER — Encounter: Payer: Self-pay | Admitting: Family Medicine

## 2015-07-19 ENCOUNTER — Ambulatory Visit (INDEPENDENT_AMBULATORY_CARE_PROVIDER_SITE_OTHER): Payer: 59 | Admitting: Family Medicine

## 2015-07-19 VITALS — BP 126/82 | HR 65 | Temp 98.0°F | Ht 62.33 in | Wt 147.8 lb

## 2015-07-19 DIAGNOSIS — J069 Acute upper respiratory infection, unspecified: Secondary | ICD-10-CM | POA: Diagnosis not present

## 2015-07-19 NOTE — Progress Notes (Signed)
Pre visit review using our clinic review tool, if applicable. No additional management support is needed unless otherwise documented below in the visit note. 

## 2015-07-19 NOTE — Assessment & Plan Note (Signed)
Symptoms consistent with early stages of viral upper respiratory infection. No focal findings on exam to indicate bacterial infection. Unlikely strep throat given lack of exudate, fever, and given age and presence of cough. Discussed supportive care. Can start Claritin or Zyrtec. Saltwater gargles. Honey in warm tea. Cough drops. Given return precautions.

## 2015-07-19 NOTE — Progress Notes (Signed)
Patient ID: Jacqueline Waters, female   DOB: [birth year], 60 y.o.   MRN: [id number]  Tommi Rumps, MD Phone: [phone]  Jacqueline Waters is a 60 y.o. female who presents today for same-day visit. Patient notes onset of sore throat and ears popping yesterday. She notes minimal nonproductive cough. No fevers or shortness of breath. Notes sick contacts at work. She is status post tonsillectomy. She feels well overall. She took Motrin and that helped. She has also tried salt water gargles and cough drops and those help. No history of allergic rhinitis. She does not smoke.   PMH: nonsmoker.   ROS see history of present illness  Objective  Physical Exam Filed Vitals:   07/19/15 0913  BP: 126/82  Pulse: 65  Temp: 98 F (36.7 C)    Physical Exam  Constitutional: She is well-developed, well-nourished, and in no distress.  HENT:  Head: Normocephalic and atraumatic.  Right Ear: External ear normal.  Left Ear: External ear normal.  Mild posterior oropharyngeal erythema, no exudate  Eyes: Conjunctivae are normal. Pupils are equal, round, and reactive to light.  Neck: Neck supple.  Cardiovascular: Normal rate, regular rhythm and normal heart sounds.  Exam reveals no gallop and no friction rub.   No murmur heard. Pulmonary/Chest: Effort normal and breath sounds normal. No respiratory distress. She has no wheezes. She has no rales.  Lymphadenopathy:    She has no cervical adenopathy.  Neurological: She is alert. Gait normal.  Skin: Skin is warm and dry. She is not diaphoretic.     Assessment/Plan: Please see individual problem list.  Viral upper respiratory infection Symptoms consistent with early stages of viral upper respiratory infection. No focal findings on exam to indicate bacterial infection. Unlikely strep throat given lack of exudate, fever, and given age and presence of cough. Discussed supportive care. Can start Claritin or Zyrtec. Saltwater gargles. Honey in warm tea. Cough  drops. Given return precautions.    Tommi Rumps

## 2015-07-19 NOTE — Patient Instructions (Signed)
Nice to meet you. Your symptoms are likely related to a viral illness. You can do salt water gargles, honey in warm tea, Motrin or Tylenol, cough drops to help with your symptoms. You can also try Claritin or Zyrtec. Continue nasal saline rinses as well. If you develop fevers, shortness of breath, cough productive of blood, or any new or changing symptoms please seek medical attention.

## 2015-07-22 ENCOUNTER — Ambulatory Visit (INDEPENDENT_AMBULATORY_CARE_PROVIDER_SITE_OTHER): Payer: 59 | Admitting: Family Medicine

## 2015-07-22 ENCOUNTER — Encounter: Payer: Self-pay | Admitting: Family Medicine

## 2015-07-22 VITALS — BP 136/82 | HR 68 | Temp 98.0°F | Wt 147.5 lb

## 2015-07-22 DIAGNOSIS — E559 Vitamin D deficiency, unspecified: Secondary | ICD-10-CM | POA: Diagnosis not present

## 2015-07-22 DIAGNOSIS — J069 Acute upper respiratory infection, unspecified: Secondary | ICD-10-CM

## 2015-07-22 DIAGNOSIS — I10 Essential (primary) hypertension: Secondary | ICD-10-CM

## 2015-07-22 LAB — VITAMIN D 25 HYDROXY (VIT D DEFICIENCY, FRACTURES): VITD: 34.91 ng/mL (ref 30.00–100.00)

## 2015-07-22 LAB — BASIC METABOLIC PANEL
BUN: 13 mg/dL (ref 6–23)
CO2: 26 mEq/L (ref 19–32)
Calcium: 10 mg/dL (ref 8.4–10.5)
Chloride: 103 mEq/L (ref 96–112)
Creatinine, Ser: 1 mg/dL (ref 0.40–1.20)
GFR: 73.13 mL/min (ref >60.00)
Glucose, Bld: 110 mg/dL — ABNORMAL HIGH (ref 70–99)
Potassium: 3.7 mEq/L (ref 3.5–5.1)
Sodium: 137 mEq/L (ref 135–145)

## 2015-07-22 NOTE — Assessment & Plan Note (Signed)
Continues slowly improving.

## 2015-07-22 NOTE — Patient Instructions (Signed)
You are doing well today Blood work today Return as needed or in 4-5 months for physical.

## 2015-07-22 NOTE — Assessment & Plan Note (Signed)
On 2000 IU daily. Check vit D levels today.

## 2015-07-22 NOTE — Progress Notes (Signed)
Pre visit review using our clinic review tool, if applicable. No additional management support is needed unless otherwise documented below in the visit note. 

## 2015-07-22 NOTE — Progress Notes (Signed)
BP 136/82 mmHg  Pulse 68  Temp(Src) 98 F (36.7 C) (Oral)  Wt 147 lb 8 oz (66.906 kg)   CC: 6 mo f/u visit  Subjective:    Patient ID: Jacqueline Waters, female    DOB: Nov 03, 1955, 60 y.o.   MRN: [id number]  HPI: Jacqueline Waters is a 60 y.o. female presenting on 07/22/2015 for Follow-up   Seen last week at [facility] with viral URI sxs. ST has improved but persistent head congestion.   HTN - Compliant with current antihypertensive regimen of lotrel 10/20 daily, bisoprolol 5mg  daily, lasix 20mg  PRN with potassium 79mEq, and latest addition of spironolactone 25mg  daily.  Does check blood pressures at home: 120-130/80s. No low blood pressure readings or symptoms of dizziness/syncope. Denies HA, vision changes, CP/tightness, SOB, leg swelling. Some night time leg cramping, not in last 2 weeks.   Vit D def - compliant with vit D 2000 IU daily.   Relevant past medical, surgical, family and social history reviewed and updated as indicated. Interim medical history since our last visit reviewed. Allergies and medications reviewed and updated. Current Outpatient Prescriptions on File Prior to Visit  Medication Sig  . amLODipine-benazepril (LOTREL) 10-20 MG per capsule Take 1 capsule by mouth daily.  [name] atorvastatin (LIPITOR) 20 MG tablet Take 1 tablet (20 mg total) by mouth every morning.  [name] BIOTIN PO Take 1 tablet by mouth daily.  . bisoprolol (ZEBETA) 5 MG tablet TAKE 1 TABLET BY MOUTH DAILY.  . calcium carbonate (OS-CAL) 600 MG TABS tablet Take 600 mg by mouth daily with breakfast.  . Cholecalciferol (VITAMIN D) 2000 UNITS tablet Take 1 tablet (2,000 Units total) by mouth daily.  [name] estradiol (ESTRACE) 2 MG tablet TAKE 1 TABLET BY MOUTH DAILY (Patient taking differently: Take 2 mg by mouth every other day. TAKE 1 TABLET BY MOUTH DAILY)  . furosemide (LASIX) 20 MG tablet TAKE 1 TABLET BY MOUTH ONCE DAILY AS NEEDED FOR LEG SWELLING  . Multiple Vitamins-Minerals (CENTRUM PO) Take 1 tablet  by mouth daily.  . potassium chloride SA (KLOR-CON M20) 20 MEQ tablet TAKE 1 TABLET BY MOUTH  DAILY AS NEEDED WHEN TAKE LASIX.  [name] spironolactone (ALDACTONE) 25 MG tablet TAKE 1 TABLET BY MOUTH ONCE DAILY  . methocarbamol (ROBAXIN) 500 MG tablet Take 1 tablet (500 mg total) by mouth at bedtime as needed for muscle spasms. (Patient not taking: Reported on 07/22/2015)   No current facility-administered medications on file prior to visit.    Review of Systems Per HPI unless specifically indicated in ROS section     Objective:    BP 136/82 mmHg  Pulse 68  Temp(Src) 98 F (36.7 C) (Oral)  Wt 147 lb 8 oz (66.906 kg)  Wt Readings from Last 3 Encounters:  07/22/15 147 lb 8 oz (66.906 kg)  07/19/15 147 lb 12.8 oz (67.042 kg)  01/31/15 145 lb (65.772 kg)    Physical Exam  Constitutional: She appears well-developed and well-nourished. No distress.  HENT:  Mouth/Throat: Oropharynx is clear and moist. No oropharyngeal exudate.  Eyes: Conjunctivae and EOM are normal. Pupils are equal, round, and reactive to light. No scleral icterus.  Neck: Normal range of motion. Neck supple. No thyromegaly present.  Cardiovascular: Normal rate, regular rhythm, normal heart sounds and intact distal pulses.   No murmur heard. Pulmonary/Chest: Effort normal and breath sounds normal. No respiratory distress. She has no wheezes. She has no rales.  Musculoskeletal: She exhibits no edema.  Lymphadenopathy:  She has no cervical adenopathy.  Skin: Skin is warm and dry. No rash noted.  Psychiatric: She has a normal mood and affect.  Nursing note and vitals reviewed.  Results for orders placed or performed in visit on 123456  Basic metabolic panel  Result Value Ref Range   Sodium 136 135 - 145 mEq/L   Potassium 4.7 3.5 - 5.1 mEq/L   Chloride 103 96 - 112 mEq/L   CO2 28 19 - 32 mEq/L   Glucose, Bld 88 70 - 99 mg/dL   BUN 18 6 - 23 mg/dL   Creatinine, Ser 0.87 0.40 - 1.20 mg/dL   Calcium 9.9 8.4 - 10.5  mg/dL   GFR 86.03 >60.00 mL/min      Assessment & Plan:   Problem List Items Addressed This Visit    Vitamin D deficiency    On 2000 IU daily. Check vit D levels today.      Relevant Orders   VITAMIN D 25 Hydroxy (Vit-D Deficiency, Fractures)   Viral upper respiratory infection    Continues slowly improving.      Essential hypertension - Primary    Chronic, stable. Continue current regimen. Rare lasix/K use. Check BMP today.      Relevant Orders   Basic metabolic panel       Follow up plan: Return in about 5 months (around 12/19/2015) for annual exam, prior fasting for blood work.

## 2015-07-22 NOTE — Assessment & Plan Note (Signed)
Chronic, stable. Continue current regimen. Rare lasix/K use. Check BMP today.

## 2015-08-07 ENCOUNTER — Other Ambulatory Visit: Payer: Self-pay | Admitting: Family Medicine

## 2015-08-07 MED FILL — ATORVASTATIN 20 MG TABLET: 20 | 90 days supply | Qty: 90 | Fill #2

## 2015-08-07 MED FILL — SPIRONOLACTONE 25 MG TABLET: 25 | 90 days supply | Qty: 90 | Fill #1

## 2015-08-08 MED FILL — AMLODIPINE-BENAZEPRIL 10-20: 10-20 | 90 days supply | Qty: 90 | Fill #0

## 2015-09-30 MED FILL — BISOPROLOL FUMARATE 5 MG TA: 5 | 90 days supply | Qty: 90 | Fill #3

## 2015-09-30 MED FILL — ESTRADIOL 2 MG TABLET: 2 | 90 days supply | Qty: 90 | Fill #1

## 2015-11-19 MED FILL — AMLODIPINE-BENAZEPRIL 10-20: 10-20 | 90 days supply | Qty: 90 | Fill #1

## 2015-12-04 ENCOUNTER — Other Ambulatory Visit: Payer: Self-pay | Admitting: Family Medicine

## 2015-12-04 MED FILL — ATORVASTATIN 20 MG TABLET: 20 | 90 days supply | Qty: 90 | Fill #0

## 2015-12-04 MED FILL — SPIRONOLACTONE 25 MG TABLET: 25 | 90 days supply | Qty: 90 | Fill #2

## 2015-12-16 ENCOUNTER — Other Ambulatory Visit: Payer: 59

## 2015-12-17 ENCOUNTER — Other Ambulatory Visit: Payer: Self-pay | Admitting: Family Medicine

## 2015-12-17 DIAGNOSIS — I10 Essential (primary) hypertension: Secondary | ICD-10-CM

## 2015-12-17 DIAGNOSIS — Z1159 Encounter for screening for other viral diseases: Secondary | ICD-10-CM

## 2015-12-17 DIAGNOSIS — E7800 Pure hypercholesterolemia, unspecified: Secondary | ICD-10-CM

## 2015-12-18 ENCOUNTER — Other Ambulatory Visit (INDEPENDENT_AMBULATORY_CARE_PROVIDER_SITE_OTHER): Payer: 59

## 2015-12-18 DIAGNOSIS — Z1159 Encounter for screening for other viral diseases: Secondary | ICD-10-CM

## 2015-12-18 DIAGNOSIS — I10 Essential (primary) hypertension: Secondary | ICD-10-CM | POA: Diagnosis not present

## 2015-12-18 DIAGNOSIS — E7800 Pure hypercholesterolemia, unspecified: Secondary | ICD-10-CM

## 2015-12-18 LAB — LIPID PANEL
Cholesterol: 168 mg/dL (ref 0–200)
HDL: 44.1 mg/dL (ref >39.00)
LDL Cholesterol: 110 mg/dL — ABNORMAL HIGH (ref 0–99)
NonHDL: 124.39
Total CHOL/HDL Ratio: 4
Triglycerides: 70 mg/dL (ref 0.0–149.0)
VLDL: 14 mg/dL (ref 0.0–40.0)

## 2015-12-18 LAB — COMPREHENSIVE METABOLIC PANEL
ALT: 17 U/L (ref 0–35)
AST: 19 U/L (ref 0–37)
Albumin: 4.3 g/dL (ref 3.5–5.2)
Alkaline Phosphatase: 42 U/L (ref 39–117)
BUN: 19 mg/dL (ref 6–23)
CO2: 28 mEq/L (ref 19–32)
Calcium: 10.2 mg/dL (ref 8.4–10.5)
Chloride: 105 mEq/L (ref 96–112)
Creatinine, Ser: 1.02 mg/dL (ref 0.40–1.20)
GFR: 71.37 mL/min (ref >60.00)
Glucose, Bld: 101 mg/dL — ABNORMAL HIGH (ref 70–99)
Potassium: 3.6 mEq/L (ref 3.5–5.1)
Sodium: 137 mEq/L (ref 135–145)
Total Bilirubin: 0.3 mg/dL (ref 0.2–1.2)
Total Protein: 7.6 g/dL (ref 6.0–8.3)

## 2015-12-19 ENCOUNTER — Encounter: Payer: 59 | Admitting: Family Medicine

## 2015-12-19 LAB — HEPATITIS C ANTIBODY: HCV Ab: NEGATIVE

## 2016-01-06 ENCOUNTER — Encounter: Payer: 59 | Admitting: Family Medicine

## 2016-01-16 ENCOUNTER — Other Ambulatory Visit: Payer: Self-pay | Admitting: Family Medicine

## 2016-01-16 DIAGNOSIS — Z0000 Encounter for general adult medical examination without abnormal findings: Secondary | ICD-10-CM

## 2016-01-16 MED FILL — BISOPROLOL FUMARATE 5 MG TA: 5 | 90 days supply | Qty: 90 | Fill #0

## 2016-01-24 ENCOUNTER — Ambulatory Visit (INDEPENDENT_AMBULATORY_CARE_PROVIDER_SITE_OTHER): Payer: 59 | Admitting: Family Medicine

## 2016-01-24 ENCOUNTER — Encounter: Payer: Self-pay | Admitting: Family Medicine

## 2016-01-24 VITALS — BP 128/76 | HR 60 | Temp 97.9°F | Ht 62.25 in | Wt 146.5 lb

## 2016-01-24 DIAGNOSIS — I10 Essential (primary) hypertension: Secondary | ICD-10-CM

## 2016-01-24 DIAGNOSIS — E7800 Pure hypercholesterolemia, unspecified: Secondary | ICD-10-CM | POA: Diagnosis not present

## 2016-01-24 DIAGNOSIS — E559 Vitamin D deficiency, unspecified: Secondary | ICD-10-CM

## 2016-01-24 DIAGNOSIS — Z0000 Encounter for general adult medical examination without abnormal findings: Secondary | ICD-10-CM | POA: Diagnosis not present

## 2016-01-24 MED ORDER — SPIRONOLACTONE 25 MG PO TABS: 25.0000 mg | ORAL_TABLET | Freq: Every day | ORAL | 3 refills | Status: DC

## 2016-01-24 MED ORDER — BISOPROLOL FUMARATE 5 MG PO TABS: 5.0000 mg | ORAL_TABLET | Freq: Every day | ORAL | 3 refills | Status: DC

## 2016-01-24 MED ORDER — AMLODIPINE BESY-BENAZEPRIL HCL 10-20 MG PO CAPS: 1.0000 | ORAL_CAPSULE | Freq: Every day | ORAL | 3 refills | Status: DC

## 2016-01-24 NOTE — Progress Notes (Signed)
BP 128/76   Pulse 60   Temp 97.9 F (36.6 C) (Oral)   Ht 5' 2.25" (1.581 m)   Wt 146 lb 8 oz (66.5 kg)   BMI 26.58 kg/m    CC: CPE Subjective:    Patient ID: Jacqueline Waters, female    DOB: [birth year], 60 y.o.   MRN: [id number]  HPI: Jacqueline Waters is a 60 y.o. female presenting on 01/24/2016 for Annual Exam   HTN - compliant with lotrel 10/20, bisoprolol 5mg  daily, rare lasix 20mg  PRN with potassium 64mEq PRN. Last visit we added spironolactone 25mg  daily. Doing well on current regimen.   Preventative: COLONOSCOPY Date: 2010 WNL Olevia Perches)  Well woman with OBGYN Dr. Hulan Fray last 02/2014.  Mammo - last 08/2013. Overdue. Will schedule  Flu shot at work  Tdap 2014  Seat belt use discussed. Sunscreen use discussed. No changing moles on skin.   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 Interim medical history since our last visit reviewed. Allergies and medications reviewed and updated. Current Outpatient Prescriptions on File Prior to Visit  Medication Sig  . atorvastatin (LIPITOR) 20 MG tablet TAKE 1 TABLET (20 MG TOTAL) BY MOUTH EVERY MORNING.  . calcium carbonate (OS-CAL) 600 MG TABS tablet Take 600 mg by mouth daily with breakfast.  . furosemide (LASIX) 20 MG tablet TAKE 1 TABLET BY MOUTH ONCE DAILY AS NEEDED FOR LEG SWELLING  . Multiple Vitamins-Minerals (CENTRUM PO) Take 1 tablet by mouth daily.  . potassium chloride SA (KLOR-CON M20) 20 MEQ tablet TAKE 1 TABLET BY MOUTH  DAILY AS NEEDED WHEN TAKE LASIX.  [name] BIOTIN PO Take 1 tablet by mouth daily.  . methocarbamol (ROBAXIN) 500 MG tablet Take 1 tablet (500 mg total) by mouth at bedtime as needed for muscle spasms. (Patient not taking: Reported on 07/22/2015)   No current facility-administered medications  on file prior to visit.     Review of Systems  Constitutional: Negative for activity change, appetite change, chills, fatigue, fever and unexpected weight change.  HENT: Negative for hearing loss.   Eyes: Negative for visual disturbance.  Respiratory: Negative for cough, chest tightness, shortness of breath and wheezing.   Cardiovascular: Negative for chest pain, palpitations and leg swelling.  Gastrointestinal: Negative for abdominal distention, abdominal pain, blood in stool, constipation, diarrhea, nausea and vomiting.  Genitourinary: Negative for difficulty urinating and hematuria.  Musculoskeletal: Negative for arthralgias, myalgias and neck pain.  Skin: Negative for rash.  Neurological: Negative for dizziness, seizures, syncope and headaches.  Hematological: Negative for adenopathy. Does not bruise/bleed easily.  Psychiatric/Behavioral: Negative for dysphoric mood. The patient is not nervous/anxious.    Per HPI unless specifically indicated in ROS section     Objective:    BP 128/76   Pulse 60   Temp 97.9 F (36.6 C) (Oral)   Ht 5' 2.25" (1.581 m)   Wt 146 lb 8 oz (66.5 kg)   BMI 26.58 kg/m   Wt Readings from Last 3 Encounters:  01/24/16 146 lb 8 oz (66.5 kg)  07/22/15 147 lb 8 oz (66.9 kg)  07/19/15 147 lb 12.8 oz (67 kg)    Physical Exam  Constitutional: She is oriented to person, place, and time. She appears well-developed and well-nourished. No distress.  HENT:  Head: Normocephalic and atraumatic.  Right Ear:  Hearing, tympanic membrane, external ear and ear canal normal.  Left Ear: Hearing, tympanic membrane, external ear and ear canal normal.  Nose: Nose normal.  Mouth/Throat: Uvula is midline, oropharynx is clear and moist and mucous membranes are normal. No oropharyngeal exudate, posterior oropharyngeal edema or posterior oropharyngeal erythema.  Eyes: Conjunctivae and EOM are normal. Pupils are equal, round, and reactive to light. No scleral icterus.  Neck:  Normal range of motion. Neck supple. No thyromegaly present.  Cardiovascular: Normal rate, regular rhythm, normal heart sounds and intact distal pulses.   No murmur heard. Pulses:      Radial pulses are 2+ on the right side, and 2+ on the left side.  Pulmonary/Chest: Effort normal and breath sounds normal. No respiratory distress. She has no wheezes. She has no rales.  Abdominal: Soft. Bowel sounds are normal. She exhibits no distension and no mass. There is no tenderness. There is no rebound and no guarding.  Musculoskeletal: Normal range of motion. She exhibits no edema.  Lymphadenopathy:    She has no cervical adenopathy.  Neurological: She is alert and oriented to person, place, and time.  CN grossly intact, station and gait intact  Skin: Skin is warm and dry. No rash noted.  Psychiatric: She has a normal mood and affect. Her behavior is normal. Judgment and thought content normal.  Nursing note and vitals reviewed.  Results for orders placed or performed in visit on 12/18/15  Lipid panel  Result Value Ref Range   Cholesterol 168 0 - 200 mg/dL   Triglycerides 70.0 0.0 - 149.0 mg/dL   HDL 44.10 >39.00 mg/dL   VLDL 14.0 0.0 - 40.0 mg/dL   LDL Cholesterol 110 (H) 0 - 99 mg/dL   Total CHOL/HDL Ratio 4    NonHDL 124.39   Comprehensive metabolic panel  Result Value Ref Range   Sodium 137 135 - 145 mEq/L   Potassium 3.6 3.5 - 5.1 mEq/L   Chloride 105 96 - 112 mEq/L   CO2 28 19 - 32 mEq/L   Glucose, Bld 101 (H) 70 - 99 mg/dL   BUN 19 6 - 23 mg/dL   Creatinine, Ser 1.02 0.40 - 1.20 mg/dL   Total Bilirubin 0.3 0.2 - 1.2 mg/dL   Alkaline Phosphatase 42 39 - 117 U/L   AST 19 0 - 37 U/L   ALT 17 0 - 35 U/L   Total Protein 7.6 6.0 - 8.3 g/dL   Albumin 4.3 3.5 - 5.2 g/dL   Calcium 10.2 8.4 - 10.5 mg/dL   GFR 71.37 >60.00 mL/min  Hepatitis C antibody  Result Value Ref Range   HCV Ab NEGATIVE NEGATIVE      Assessment & Plan:   Problem List Items Addressed This Visit    Essential  hypertension    Chronic, stable. Continue current regimen. Rare lasix use.       Relevant Medications   amLODipine-benazepril (LOTREL) 10-20 MG capsule   bisoprolol (ZEBETA) 5 MG tablet   spironolactone (ALDACTONE) 25 MG tablet   Health maintenance examination - Primary    Preventative protocols reviewed and updated unless pt declined. Discussed healthy diet and lifestyle.       HYPERCHOLESTEROLEMIA    Chronic, stable. Continue lipitor 20mg  daily. ASCVD 10 yr risk = 6% on statin.      Relevant Medications   amLODipine-benazepril (LOTREL) 10-20 MG capsule   bisoprolol (ZEBETA) 5 MG tablet   spironolactone (ALDACTONE) 25 MG tablet   Vitamin D deficiency  Chronically taking vitamin D 2000 IU daily. May decrease to 1000 IU next fill.       Other Visit Diagnoses    Physical exam, annual       Relevant Medications   bisoprolol (ZEBETA) 5 MG tablet       Follow up plan: Return in about 1 year (around 01/23/2017), or as needed, for annual exam, prior fasting for blood work.  Ria Bush, MD

## 2016-01-24 NOTE — Assessment & Plan Note (Signed)
Chronic, stable. Continue current regimen. Rare lasix use.

## 2016-01-24 NOTE — Progress Notes (Signed)
Pre visit review using our clinic review tool, if applicable. No additional management support is needed unless otherwise documented below in the visit note. 

## 2016-01-24 NOTE — Assessment & Plan Note (Addendum)
Chronically taking vitamin D 2000 IU daily. May decrease to 1000 IU next fill.

## 2016-01-24 NOTE — Assessment & Plan Note (Signed)
Rare lasix + potassium PRN.

## 2016-01-24 NOTE — Assessment & Plan Note (Signed)
Chronic, stable. Continue lipitor 20mg  daily. ASCVD 10 yr risk = 6% on statin.

## 2016-01-24 NOTE — Assessment & Plan Note (Signed)
Preventative protocols reviewed and updated unless pt declined. Discussed healthy diet and lifestyle.  

## 2016-01-24 NOTE — Patient Instructions (Addendum)
Call to schedule mammogram. Try ibuprofen prior to imaging. Consider decreased vitamin D to 1000 IU daily.  You are doing well today! Keep up the good work.  Health Maintenance, Female Adopting a healthy lifestyle and getting preventive care can go a long way to promote health and wellness. Talk with your health care provider about what schedule of regular examinations is right for you. This is a good chance for you to check in with your provider about disease prevention and staying healthy. In between checkups, there are plenty of things you can do on your own. Experts have done a lot of research about which lifestyle changes and preventive measures are most likely to keep you healthy. Ask your health care provider for more information. WEIGHT AND DIET  Eat a healthy diet  Be sure to include plenty of vegetables, fruits, low-fat dairy products, and lean protein.  Do not eat a lot of foods high in solid fats, added sugars, or salt.  Get regular exercise. This is one of the most important things you can do for your health.  Most adults should exercise for at least 150 minutes each week. The exercise should increase your heart rate and make you sweat (moderate-intensity exercise).  Most adults should also do strengthening exercises at least twice a week. This is in addition to the moderate-intensity exercise.  Maintain a healthy weight  Body mass index (BMI) is a measurement that can be used to identify possible weight problems. It estimates body fat based on height and weight. Your health care provider can help determine your BMI and help you achieve or maintain a healthy weight.  For females 43 years of age and older:   A BMI below 18.5 is considered underweight.  A BMI of 18.5 to 24.9 is normal.  A BMI of 25 to 29.9 is considered overweight.  A BMI of 30 and above is considered obese.  Watch levels of cholesterol and blood lipids  You should start having your blood tested for  lipids and cholesterol at 59 years of age, then have this test every 5 years.  You may need to have your cholesterol levels checked more often if:  Your lipid or cholesterol levels are high.  You are older than 59 years of age.  You are at high risk for heart disease.  CANCER SCREENING   Lung Cancer  Lung cancer screening is recommended for adults 71-6 years old who are at high risk for lung cancer because of a history of smoking.  A yearly low-dose CT scan of the lungs is recommended for people who:  Currently smoke.  Have quit within the past 15 years.  Have at least a 30-pack-year history of smoking. A pack year is smoking an average of one pack of cigarettes a day for 1 year.  Yearly screening should continue until it has been 15 years since you quit.  Yearly screening should stop if you develop a health problem that would prevent you from having lung cancer treatment.  Breast Cancer  Practice breast self-awareness. This means understanding how your breasts normally appear and feel.  It also means doing regular breast self-exams. Let your health care provider know about any changes, no matter how small.  If you are in your 20s or 30s, you should have a clinical breast exam (CBE) by a health care provider every 1-3 years as part of a regular health exam.  If you are 20 or older, have a CBE every year. Also consider  having a breast X-ray (mammogram) every year.  If you have a family history of breast cancer, talk to your health care provider about genetic screening.  If you are at high risk for breast cancer, talk to your health care provider about having an MRI and a mammogram every year.  Breast cancer gene (BRCA) assessment is recommended for women who have family members with BRCA-related cancers. BRCA-related cancers include:  Breast.  Ovarian.  Tubal.  Peritoneal cancers.  Results of the assessment will determine the need for genetic counseling and BRCA1  and BRCA2 testing. Cervical Cancer Your health care provider may recommend that you be screened regularly for cancer of the pelvic organs (ovaries, uterus, and vagina). This screening involves a pelvic examination, including checking for microscopic changes to the surface of your cervix (Pap test). You may be encouraged to have this screening done every 3 years, beginning at age 46.  For women ages 18-65, health care providers may recommend pelvic exams and Pap testing every 3 years, or they may recommend the Pap and pelvic exam, combined with testing for human papilloma virus (HPV), every 5 years. Some types of HPV increase your risk of cervical cancer. Testing for HPV may also be done on women of any age with unclear Pap test results.  Other health care providers may not recommend any screening for nonpregnant women who are considered low risk for pelvic cancer and who do not have symptoms. Ask your health care provider if a screening pelvic exam is right for you.  If you have had past treatment for cervical cancer or a condition that could lead to cancer, you need Pap tests and screening for cancer for at least 20 years after your treatment. If Pap tests have been discontinued, your risk factors (such as having a new sexual partner) need to be reassessed to determine if screening should resume. Some women have medical problems that increase the chance of getting cervical cancer. In these cases, your health care provider may recommend more frequent screening and Pap tests. Colorectal Cancer  This type of cancer can be detected and often prevented.  Routine colorectal cancer screening usually begins at 59 years of age and continues through 59 years of age.  Your health care provider may recommend screening at an earlier age if you have risk factors for colon cancer.  Your health care provider may also recommend using home test kits to check for hidden blood in the stool.  A small camera at the  end of a tube can be used to examine your colon directly (sigmoidoscopy or colonoscopy). This is done to check for the earliest forms of colorectal cancer.  Routine screening usually begins at age 19.  Direct examination of the colon should be repeated every 5-10 years through 59 years of age. However, you may need to be screened more often if early forms of precancerous polyps or small growths are found. Skin Cancer  Check your skin from head to toe regularly.  Tell your health care provider about any new moles or changes in moles, especially if there is a change in a mole's shape or color.  Also tell your health care provider if you have a mole that is larger than the size of a pencil eraser.  Always use sunscreen. Apply sunscreen liberally and repeatedly throughout the day.  Protect yourself by wearing long sleeves, pants, a wide-brimmed hat, and sunglasses whenever you are outside. HEART DISEASE, DIABETES, AND HIGH BLOOD PRESSURE   High blood  pressure causes heart disease and increases the risk of stroke. High blood pressure is more likely to develop in:  People who have blood pressure in the high end of the normal range (130-139/85-89 mm Hg).  People who are overweight or obese.  People who are African American.  If you are 6-59 years of age, have your blood pressure checked every 3-5 years. If you are 70 years of age or older, have your blood pressure checked every year. You should have your blood pressure measured twice--once when you are at a hospital or clinic, and once when you are not at a hospital or clinic. Record the average of the two measurements. To check your blood pressure when you are not at a hospital or clinic, you can use:  An automated blood pressure machine at a pharmacy.  A home blood pressure monitor.  If you are between 28 years and 3 years old, ask your health care provider if you should take aspirin to prevent strokes.  Have regular diabetes  screenings. This involves taking a blood sample to check your fasting blood sugar level.  If you are at a normal weight and have a low risk for diabetes, have this test once every three years after 59 years of age.  If you are overweight and have a high risk for diabetes, consider being tested at a younger age or more often. PREVENTING INFECTION  Hepatitis B  If you have a higher risk for hepatitis B, you should be screened for this virus. You are considered at high risk for hepatitis B if:  You were born in a country where hepatitis B is common. Ask your health care provider which countries are considered high risk.  Your parents were born in a high-risk country, and you have not been immunized against hepatitis B (hepatitis B vaccine).  You have HIV or AIDS.  You use needles to inject street drugs.  You live with someone who has hepatitis B.  You have had sex with someone who has hepatitis B.  You get hemodialysis treatment.  You take certain medicines for conditions, including cancer, organ transplantation, and autoimmune conditions. Hepatitis C  Blood testing is recommended for:  Everyone born from [birth year] through 1964.  Anyone with known risk factors for hepatitis C. Sexually transmitted infections (STIs)  You should be screened for sexually transmitted infections (STIs) including gonorrhea and chlamydia if:  You are sexually active and are younger than 59 years of age.  You are older than 59 years of age and your health care provider tells you that you are at risk for this type of infection.  Your sexual activity has changed since you were last screened and you are at an increased risk for chlamydia or gonorrhea. Ask your health care provider if you are at risk.  If you do not have HIV, but are at risk, it may be recommended that you take a prescription medicine daily to prevent HIV infection. This is called pre-exposure prophylaxis (PrEP). You are considered at risk  if:  You are sexually active and do not regularly use condoms or know the HIV status of your partner(s).  You take drugs by injection.  You are sexually active with a partner who has HIV. Talk with your health care provider about whether you are at high risk of being infected with HIV. If you choose to begin PrEP, you should first be tested for HIV. You should then be tested every 3 months for as long as  you are taking PrEP.  PREGNANCY   If you are premenopausal and you may become pregnant, ask your health care provider about preconception counseling.  If you may become pregnant, take 400 to 800 micrograms (mcg) of folic acid every day.  If you want to prevent pregnancy, talk to your health care provider about birth control (contraception). OSTEOPOROSIS AND MENOPAUSE   Osteoporosis is a disease in which the bones lose minerals and strength with aging. This can result in serious bone fractures. Your risk for osteoporosis can be identified using a bone density scan.  If you are 93 years of age or older, or if you are at risk for osteoporosis and fractures, ask your health care provider if you should be screened.  Ask your health care provider whether you should take a calcium or vitamin D supplement to lower your risk for osteoporosis.  Menopause may have certain physical symptoms and risks.  Hormone replacement therapy may reduce some of these symptoms and risks. Talk to your health care provider about whether hormone replacement therapy is right for you.  HOME CARE INSTRUCTIONS   Schedule regular health, dental, and eye exams.  Stay current with your immunizations.   Do not use any tobacco products including cigarettes, chewing tobacco, or electronic cigarettes.  If you are pregnant, do not drink alcohol.  If you are breastfeeding, limit how much and how often you drink alcohol.  Limit alcohol intake to no more than 1 drink per day for nonpregnant women. One drink equals 12  ounces of beer, 5 ounces of wine, or 1 ounces of hard liquor.  Do not use street drugs.  Do not share needles.  Ask your health care provider for help if you need support or information about quitting drugs.  Tell your health care provider if you often feel depressed.  Tell your health care provider if you have ever been abused or do not feel safe at home.   This information is not intended to replace advice given to you by your health care provider. Make sure you discuss any questions you have with your health care provider.   Document Released: 12/15/2010 Document Revised: 06/22/2014 Document Reviewed: 05/03/2013 Elsevier Interactive Patient Education [employer].

## 2016-02-27 MED FILL — ESTRADIOL 2 MG TABLET: 2 | 90 days supply | Qty: 90 | Fill #2

## 2016-03-03 MED FILL — AMLODIPINE-BENAZEPRIL 10-20: 10-20 | 90 days supply | Qty: 90 | Fill #2

## 2016-03-25 MED FILL — SPIRONOLACTONE 25 MG TABLET: 25 | 90 days supply | Qty: 90 | Fill #3

## 2016-04-24 MED FILL — ATORVASTATIN 20 MG TABLET: 20 | 90 days supply | Qty: 90 | Fill #1

## 2016-05-06 MED FILL — BISOPROLOL FUMARATE 5 MG TA: 5 | 90 days supply | Qty: 90 | Fill #0

## 2016-05-31 ENCOUNTER — Telehealth: Payer: 59 | Admitting: Family

## 2016-05-31 DIAGNOSIS — J029 Acute pharyngitis, unspecified: Secondary | ICD-10-CM

## 2016-05-31 MED ORDER — BENZONATATE 100 MG PO CAPS: 100.0000 mg | ORAL_CAPSULE | Freq: Three times a day (TID) | ORAL | 0 refills | Status: DC | PRN

## 2016-05-31 NOTE — Progress Notes (Signed)
We are sorry that you are not feeling well.  Here is how we plan to help!  Based on what you have shared with me it looks like you have upper respiratory tract inflammation that has resulted in a significant cough.  Inflammation and infection in the upper respiratory tract is commonly called bronchitis and has four common causes:  Allergies, Viral Infections, Acid Reflux and Bacterial Infections.  Allergies, viruses and acid reflux are treated by controlling symptoms or eliminating the cause. An example might be a cough caused by taking certain blood pressure medications. You stop the cough by changing the medication. Another example might be a cough caused by acid reflux. Controlling the reflux helps control the cough.  Based on your presentation I believe you most likely have A cough due to a virus.  This is called viral bronchitis and is best treated by rest, plenty of fluids and control of the cough.  You may use Ibuprofen or Tylenol as directed to help your symptoms.     In addition you may use A non-prescription cough medication called Mucinex DM: take 2 tablets every 12 hours. and A prescription cough medication called Tessalon Perles 100mg . You may take 1-2 capsules every 8 hours as needed for your cough.   USE OF BRONCHODILATOR ("RESCUE") INHALERS: There is a risk from using your bronchodilator too frequently.  The risk is that over-reliance on a medication which only relaxes the muscles surrounding the breathing tubes can reduce the effectiveness of medications prescribed to reduce swelling and congestion of the tubes themselves.  Although you feel brief relief from the bronchodilator inhaler, your asthma may actually be worsening with the tubes becoming more swollen and filled with mucus.  This can delay other crucial treatments, such as oral steroid medications. If you need to use a bronchodilator inhaler daily, several times per day, you should discuss this with your provider.  There are  probably better treatments that could be used to keep your asthma under control.     HOME CARE . Only take medications as instructed by your medical team. . Complete the entire course of an antibiotic. . Drink plenty of fluids and get plenty of rest. . Avoid close contacts especially the very young and the elderly . Cover your mouth if you cough or cough into your sleeve. . Always remember to wash your hands . A steam or ultrasonic humidifier can help congestion.   GET HELP RIGHT AWAY IF: . You develop worsening fever. . You become short of breath . You cough up blood. . Your symptoms persist after you have completed your treatment plan MAKE SURE YOU   Understand these instructions.  Will watch your condition.  Will get help right away if you are not doing well or get worse.  Your e-visit answers were reviewed by a board certified advanced clinical practitioner to complete your personal care plan.  Depending on the condition, your plan could have included both over the counter or prescription medications. If there is a problem please reply  once you have received a response from your provider. Your safety is important to [place].  If you have drug allergies check your prescription carefully.    You can use MyChart to ask questions about today's visit, request a non-urgent call back, or ask for a work or school excuse for 24 hours related to this e-Visit. If it has been greater than 24 hours you will need to follow up with your provider, or enter a new  e-Visit to address those concerns. You will get an e-mail in the next two days asking about your experience.  I hope that your e-visit has been valuable and will speed your recovery. Thank you for using e-visits.

## 2016-06-01 NOTE — Progress Notes (Signed)
PLEASE NOTE: All timestamps contained within this report are represented as [place] Standard Time. CONFIDENTIALTY NOTICE: This fax transmission is intended only for the addressee. It contains information that is legally privileged, confidential or otherwise protected from use or disclosure. If you are not the intended recipient, you are strictly prohibited from reviewing, disclosing, copying using or disseminating any of this information or taking any action in reliance on or regarding this information. If you have received this fax in error, please notify us immediately by telephone so that we can arrange for its return to [place]. Phone: [phone], Toll-Free: [phone], Fax: [phone] Page: 1 of 1 Call Id: [id number] [place] Patient Name: KENESHIA Daoust Gender: Female DOB: 10-Feb-1956 Age: 60 Y 3 M 16 D Return Phone Number: [phone] (Primary), [phone] (Secondary) Address: City/State/Zip: [place] Client [place] Night - Client Client Site [place] Physician Ria Bush - MD Contact Type Call Who Is Calling Patient / Member / Family / Caregiver Call Type Triage / Clinical Caller Name Mele [place] Relationship To Patient Spouse Return Phone Number [phone] (Primary) Chief Complaint Sore Throat Reason for Call Symptomatic / Request for [place] states wife has a progressively worsening cold/cough, suspect Strep throat. Temp 101. Translation No Nurse Assessment Guidelines Guideline Title Affirmed Question Affirmed Notes Nurse Date/Time (Eastern Time) Disp. Time Eilene Ghazi Time) Disposition Final User 05/31/2016 8:09:18 AM Send To Extended Follow Up Ronnie Derby, RN, Amanda 05/31/2016 12:04:58 PM FINAL ATTEMPT MADE - message left Yes Germain Osgood RN, Opal Sidles Comments User: Westley Chandler Date/Time  Eilene Ghazi Time): 05/30/2016 9:53:16 PM [facility] caller was wondering how much longer till they're called back.

## 2016-06-22 ENCOUNTER — Encounter: Payer: Self-pay | Admitting: Family Medicine

## 2016-06-22 ENCOUNTER — Ambulatory Visit (INDEPENDENT_AMBULATORY_CARE_PROVIDER_SITE_OTHER): Payer: 59 | Admitting: Family Medicine

## 2016-06-22 DIAGNOSIS — R05 Cough: Secondary | ICD-10-CM | POA: Diagnosis not present

## 2016-06-22 DIAGNOSIS — R059 Cough, unspecified: Secondary | ICD-10-CM | POA: Insufficient documentation

## 2016-06-22 MED ORDER — AMOXICILLIN-POT CLAVULANATE 875-125 MG PO TABS: 1.0000 | ORAL_TABLET | Freq: Two times a day (BID) | ORAL | 0 refills | Status: DC

## 2016-06-22 MED ORDER — HYDROCODONE-HOMATROPINE 5-1.5 MG/5ML PO SYRP: 2.5000 mL | ORAL_SOLUTION | Freq: Three times a day (TID) | ORAL | 0 refills | Status: DC | PRN

## 2016-06-22 MED FILL — HYDROCODONE-HOMATROPINE SYR: 5-1.5 | 5 days supply | Qty: 75 | Fill #0

## 2016-06-22 MED FILL — AMOX-CLAV 875-125 MG TABLET: 875-125 | 10 days supply | Qty: 20 | Fill #0

## 2016-06-22 NOTE — Progress Notes (Signed)
Sx started about 3 weeks ago.  Cough and congestion.  Had E visit.  Started tessalon at that point.  Was getting some better except cough continued.  Couldn't sleep from cough.  Then about 1 week ago she got sicker/sick again when out of town.  Was in the bed from Tuesday until coming home yesterday.  Cough with sputum- reddish, rhinorrhea- reddish.  Ears popping after flight back from [place].  Vomiting and diarrhea last night.  Felt better after vomiting.  Prev with aches, but not now.  No rash.    Meds, vitals, and allergies reviewed.   ROS: Per HPI unless specifically indicated in ROS section   nad but she looks like she doesn't feel well, nontoxic Ncat Tm wnl R nostril with some scant bloody discharge Sinuses not ttp MMM Neck supple no LA rrr ctab Ext w/o edema Speaking in complete sentences.

## 2016-06-22 NOTE — Progress Notes (Signed)
Pre visit review using our clinic review tool, if applicable. No additional management support is needed unless otherwise documented below in the visit note. 

## 2016-06-22 NOTE — Patient Instructions (Signed)
Start augmentin and get some rest.   Drink plenty of fluids. If the cough continues, then okay to try hydrocodone cough syrup as needed- sedation caution.  Take care.  Glad to see you.  Update [place] as needed.

## 2016-06-22 NOTE — Assessment & Plan Note (Signed)
Given the duration and the sputum changes, would still treat even though sinuses not ttp.  Okay for outpatient f/u.  See AVS.  Start augmentin and get some rest.   Drink plenty of fluids. If the cough continues, then okay to try hydrocodone cough syrup as needed- sedation caution.  She agrees.

## 2016-07-09 MED FILL — AMLODIPINE-BENAZEPRIL 10-20: 10-20 | 90 days supply | Qty: 90 | Fill #3

## 2016-07-27 MED FILL — SPIRONOLACTONE 25 MG TABLET: 25 | 90 days supply | Qty: 90 | Fill #0

## 2016-08-24 ENCOUNTER — Other Ambulatory Visit: Payer: Self-pay | Admitting: Obstetrics & Gynecology

## 2016-08-24 DIAGNOSIS — E8940 Asymptomatic postprocedural ovarian failure: Secondary | ICD-10-CM

## 2016-08-24 DIAGNOSIS — Z79890 Hormone replacement therapy (postmenopausal): Secondary | ICD-10-CM

## 2016-08-24 MED FILL — ESTRADIOL 2 MG TABLET: 2 | 90 days supply | Qty: 90 | Fill #0

## 2016-08-24 NOTE — Telephone Encounter (Signed)
Refill for estradiol sent to pharmacy, pt needs to schedule appt for any further refills.

## 2016-08-28 ENCOUNTER — Other Ambulatory Visit: Payer: Self-pay | Admitting: Family Medicine

## 2016-08-28 DIAGNOSIS — Z1231 Encounter for screening mammogram for malignant neoplasm of breast: Secondary | ICD-10-CM

## 2016-09-08 ENCOUNTER — Encounter: Payer: Self-pay | Admitting: Obstetrics & Gynecology

## 2016-09-08 ENCOUNTER — Ambulatory Visit (INDEPENDENT_AMBULATORY_CARE_PROVIDER_SITE_OTHER): Payer: 59 | Admitting: Obstetrics & Gynecology

## 2016-09-08 DIAGNOSIS — E8940 Asymptomatic postprocedural ovarian failure: Secondary | ICD-10-CM

## 2016-09-08 DIAGNOSIS — Z79890 Hormone replacement therapy (postmenopausal): Secondary | ICD-10-CM

## 2016-09-08 MED ORDER — ESTRADIOL 2 MG PO TABS: 2.0000 mg | ORAL_TABLET | Freq: Every day | ORAL | 5 refills | Status: DC

## 2016-09-08 NOTE — Progress Notes (Signed)
   Subjective:    Patient ID: Jacqueline Waters, female    DOB: Apr 28, 1956, 61 y.o.   MRN: [id number]  HPI 61 yo M AA P3 (77, 63, and 59 yo kids- 3 grands) here for a refill of her ERT. She has been taking a pill QOD and not having any menopausal symptoms except vaginal dryness, uses a lubricant. Denies dyspareunia.   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

## 2016-09-09 MED FILL — BISOPROLOL FUMARATE 5 MG TA: 5 | 90 days supply | Qty: 90 | Fill #1

## 2016-09-09 MED FILL — ATORVASTATIN 20 MG TABLET: 20 | 90 days supply | Qty: 90 | Fill #2

## 2016-09-16 ENCOUNTER — Ambulatory Visit
Admission: RE | Admit: 2016-09-16 | Discharge: 2016-09-16 | Disposition: A | Discharge status: Home / Self Care | Payer: 59 | Source: Ambulatory Visit | Attending: Family Medicine | Admitting: Family Medicine

## 2016-09-16 DIAGNOSIS — Z1231 Encounter for screening mammogram for malignant neoplasm of breast: Secondary | ICD-10-CM | POA: Diagnosis not present

## 2016-09-17 ENCOUNTER — Other Ambulatory Visit: Payer: Self-pay | Admitting: Family Medicine

## 2016-09-17 DIAGNOSIS — R928 Other abnormal and inconclusive findings on diagnostic imaging of breast: Secondary | ICD-10-CM

## 2016-09-21 ENCOUNTER — Telehealth: Payer: Self-pay | Admitting: Family Medicine

## 2016-09-21 NOTE — Telephone Encounter (Signed)
The breast center called -  They are asking for [place] to sign the order that is in epic so that the pt can be seen tomorrow. Thanks

## 2016-09-21 NOTE — Telephone Encounter (Signed)
Done - we should also have faxed back form today. I filled out on Friday.

## 2016-09-22 ENCOUNTER — Ambulatory Visit
Admission: RE | Admit: 2016-09-22 | Discharge: 2016-09-22 | Disposition: A | Discharge status: Home / Self Care | Payer: 59 | Source: Ambulatory Visit | Attending: Family Medicine | Admitting: Family Medicine

## 2016-09-22 DIAGNOSIS — R928 Other abnormal and inconclusive findings on diagnostic imaging of breast: Secondary | ICD-10-CM | POA: Diagnosis not present

## 2016-09-22 DIAGNOSIS — N6489 Other specified disorders of breast: Secondary | ICD-10-CM | POA: Diagnosis not present

## 2016-09-23 ENCOUNTER — Encounter: Payer: Self-pay | Admitting: Family Medicine

## 2016-10-22 MED FILL — AMLODIPINE-BENAZEPRIL 10-20: 10-20 | 90 days supply | Qty: 90 | Fill #0

## 2016-11-10 MED FILL — SPIRONOLACTONE 25 MG TABLET: 25 | 90 days supply | Qty: 90 | Fill #1

## 2016-12-28 ENCOUNTER — Other Ambulatory Visit: Payer: Self-pay | Admitting: Family Medicine

## 2016-12-28 MED FILL — BISOPROLOL FUMARATE 5 MG TA: 5 | 90 days supply | Qty: 90 | Fill #2

## 2016-12-28 MED FILL — ATORVASTATIN 20 MG TABLET: 20 | 30 days supply | Qty: 30 | Fill #0

## 2017-02-05 ENCOUNTER — Other Ambulatory Visit: Payer: Self-pay | Admitting: Family Medicine

## 2017-02-05 DIAGNOSIS — N63 Unspecified lump in unspecified breast: Secondary | ICD-10-CM

## 2017-02-19 ENCOUNTER — Other Ambulatory Visit: Payer: Self-pay

## 2017-02-19 MED ORDER — AMLODIPINE BESY-BENAZEPRIL HCL 10-20 MG PO CAPS: 1.0000 | ORAL_CAPSULE | Freq: Every day | ORAL | 0 refills | Status: DC

## 2017-02-19 MED FILL — AMLODIPINE-BENAZEPRIL 10-20: 10-20 | 90 days supply | Qty: 90 | Fill #0

## 2017-02-19 NOTE — Telephone Encounter (Signed)
Pt is out of med and request refill amlodipine benazepril to [facility] pharmacy; pt will cb and schedule lab appt for CPX & CPX prior to running out of med. Refilled # 90 per protocol. Pt will pick up at pharmacy today. I spoke with Alvie Heidelberg and she will get ready. Pt voiced understanding.

## 2017-03-15 ENCOUNTER — Other Ambulatory Visit: Payer: Self-pay

## 2017-03-15 MED ORDER — SPIRONOLACTONE 25 MG PO TABS: 25.0000 mg | ORAL_TABLET | Freq: Every day | ORAL | 0 refills | Status: DC

## 2017-03-15 MED FILL — SPIRONOLACTONE 25 MG TABLET: 25 | 90 days supply | Qty: 90 | Fill #0

## 2017-03-15 NOTE — Telephone Encounter (Signed)
Pt request refill of spironolactone; pt is presently out of medication. Last annual 01/24/16. Refilled spironolactone to [facility] pharmacy per request and protocol. Pt scheduled annual exam on 04/14/17. Pt voiced understanding.

## 2017-03-23 ENCOUNTER — Ambulatory Visit
Admission: RE | Admit: 2017-03-23 | Discharge: 2017-03-23 | Disposition: A | Discharge status: Home / Self Care | Payer: 59 | Source: Ambulatory Visit | Attending: Family Medicine | Admitting: Family Medicine

## 2017-03-23 ENCOUNTER — Other Ambulatory Visit: Payer: Self-pay | Admitting: Family Medicine

## 2017-03-23 DIAGNOSIS — N6310 Unspecified lump in the right breast, unspecified quadrant: Secondary | ICD-10-CM

## 2017-03-23 DIAGNOSIS — R928 Other abnormal and inconclusive findings on diagnostic imaging of breast: Secondary | ICD-10-CM | POA: Diagnosis not present

## 2017-03-23 DIAGNOSIS — N63 Unspecified lump in unspecified breast: Secondary | ICD-10-CM

## 2017-04-04 ENCOUNTER — Other Ambulatory Visit: Payer: Self-pay | Admitting: Family Medicine

## 2017-04-04 DIAGNOSIS — E559 Vitamin D deficiency, unspecified: Secondary | ICD-10-CM

## 2017-04-04 DIAGNOSIS — E7800 Pure hypercholesterolemia, unspecified: Secondary | ICD-10-CM

## 2017-04-07 ENCOUNTER — Other Ambulatory Visit: Payer: 59

## 2017-04-08 ENCOUNTER — Other Ambulatory Visit (INDEPENDENT_AMBULATORY_CARE_PROVIDER_SITE_OTHER): Payer: 59

## 2017-04-08 DIAGNOSIS — E559 Vitamin D deficiency, unspecified: Secondary | ICD-10-CM

## 2017-04-08 DIAGNOSIS — E7800 Pure hypercholesterolemia, unspecified: Secondary | ICD-10-CM | POA: Diagnosis not present

## 2017-04-08 LAB — BASIC METABOLIC PANEL
BUN: 21 mg/dL (ref 6–23)
CO2: 28 mEq/L (ref 19–32)
Calcium: 10.3 mg/dL (ref 8.4–10.5)
Chloride: 105 mEq/L (ref 96–112)
Creatinine, Ser: 1.03 mg/dL (ref 0.40–1.20)
GFR: 70.26 mL/min (ref >60.00)
Glucose, Bld: 121 mg/dL — ABNORMAL HIGH (ref 70–99)
Potassium: 4.8 mEq/L (ref 3.5–5.1)
Sodium: 138 mEq/L (ref 135–145)

## 2017-04-08 LAB — LIPID PANEL
Cholesterol: 206 mg/dL — ABNORMAL HIGH (ref 0–200)
HDL: 47.1 mg/dL (ref >39.00)
LDL Cholesterol: 145 mg/dL — ABNORMAL HIGH (ref 0–99)
NonHDL: 159.31
Total CHOL/HDL Ratio: 4
Triglycerides: 73 mg/dL (ref 0.0–149.0)
VLDL: 14.6 mg/dL (ref 0.0–40.0)

## 2017-04-08 LAB — VITAMIN D 25 HYDROXY (VIT D DEFICIENCY, FRACTURES): VITD: 29.75 ng/mL — ABNORMAL LOW (ref 30.00–100.00)

## 2017-04-09 ENCOUNTER — Other Ambulatory Visit: Payer: Self-pay | Admitting: Family Medicine

## 2017-04-09 DIAGNOSIS — Z0000 Encounter for general adult medical examination without abnormal findings: Secondary | ICD-10-CM

## 2017-04-09 MED FILL — BISOPROLOL FUMARATE 5 MG TA: 5 | 90 days supply | Qty: 90 | Fill #0

## 2017-04-14 ENCOUNTER — Encounter: Payer: Self-pay | Admitting: Family Medicine

## 2017-04-14 ENCOUNTER — Ambulatory Visit (INDEPENDENT_AMBULATORY_CARE_PROVIDER_SITE_OTHER): Payer: 59 | Admitting: Family Medicine

## 2017-04-14 VITALS — BP 120/70 | HR 51 | Temp 98.0°F | Ht 62.0 in | Wt 146.0 lb

## 2017-04-14 DIAGNOSIS — Z20828 Contact with and (suspected) exposure to other viral communicable diseases: Secondary | ICD-10-CM | POA: Insufficient documentation

## 2017-04-14 DIAGNOSIS — Z0000 Encounter for general adult medical examination without abnormal findings: Secondary | ICD-10-CM | POA: Diagnosis not present

## 2017-04-14 DIAGNOSIS — E7800 Pure hypercholesterolemia, unspecified: Secondary | ICD-10-CM | POA: Diagnosis not present

## 2017-04-14 DIAGNOSIS — E559 Vitamin D deficiency, unspecified: Secondary | ICD-10-CM

## 2017-04-14 DIAGNOSIS — I10 Essential (primary) hypertension: Secondary | ICD-10-CM | POA: Diagnosis not present

## 2017-04-14 MED ORDER — SPIRONOLACTONE 25 MG PO TABS: 25.0000 mg | ORAL_TABLET | Freq: Every day | ORAL | 3 refills | Status: DC

## 2017-04-14 MED ORDER — ATORVASTATIN CALCIUM 20 MG PO TABS: 20.0000 mg | ORAL_TABLET | Freq: Every morning | ORAL | 3 refills | Status: DC

## 2017-04-14 MED ORDER — AMLODIPINE BESY-BENAZEPRIL HCL 10-20 MG PO CAPS: 1.0000 | ORAL_CAPSULE | Freq: Every day | ORAL | 3 refills | Status: DC

## 2017-04-14 MED ORDER — OSELTAMIVIR PHOSPHATE 75 MG PO CAPS: 75.0000 mg | ORAL_CAPSULE | Freq: Every day | ORAL | 0 refills | Status: DC

## 2017-04-14 MED ORDER — BISOPROLOL FUMARATE 5 MG PO TABS: 5.0000 mg | ORAL_TABLET | Freq: Every day | ORAL | 3 refills | Status: DC

## 2017-04-14 NOTE — Assessment & Plan Note (Signed)
She stopped replacement and levels have waned. rec restart 1000 IU daily.

## 2017-04-14 NOTE — Patient Instructions (Signed)
Good to see you today! Continue current medicines.  Return as needed or in 1 year for next physical.   Health Maintenance, Female Adopting a healthy lifestyle and getting preventive care can go a long way to promote health and wellness. Talk with your health care provider about what schedule of regular examinations is right for you. This is a good chance for you to check in with your provider about disease prevention and staying healthy. In between checkups, there are plenty of things you can do on your own. Experts have done a lot of research about which lifestyle changes and preventive measures are most likely to keep you healthy. Ask your health care provider for more information. Weight and diet Eat a healthy diet  Be sure to include plenty of vegetables, fruits, low-fat dairy products, and lean protein.  Do not eat a lot of foods high in solid fats, added sugars, or salt.  Get regular exercise. This is one of the most important things you can do for your health. ? Most adults should exercise for at least 150 minutes each week. The exercise should increase your heart rate and make you sweat (moderate-intensity exercise). ? Most adults should also do strengthening exercises at least twice a week. This is in addition to the moderate-intensity exercise.  Maintain a healthy weight  Body mass index (BMI) is a measurement that can be used to identify possible weight problems. It estimates body fat based on height and weight. Your health care provider can help determine your BMI and help you achieve or maintain a healthy weight.  For females 102 years of age and older: ? A BMI below 18.5 is considered underweight. ? A BMI of 18.5 to 24.9 is normal. ? A BMI of 25 to 29.9 is considered overweight. ? A BMI of 30 and above is considered obese.  Watch levels of cholesterol and blood lipids  You should start having your blood tested for lipids and cholesterol at 60 years of age, then have this  test every 5 years.  You may need to have your cholesterol levels checked more often if: ? Your lipid or cholesterol levels are high. ? You are older than 60 years of age. ? You are at high risk for heart disease.  Cancer screening Lung Cancer  Lung cancer screening is recommended for adults 61-11 years old who are at high risk for lung cancer because of a history of smoking.  A yearly low-dose CT scan of the lungs is recommended for people who: ? Currently smoke. ? Have quit within the past 15 years. ? Have at least a 30-pack-year history of smoking. A pack year is smoking an average of one pack of cigarettes a day for 1 year.  Yearly screening should continue until it has been 15 years since you quit.  Yearly screening should stop if you develop a health problem that would prevent you from having lung cancer treatment.  Breast Cancer  Practice breast self-awareness. This means understanding how your breasts normally appear and feel.  It also means doing regular breast self-exams. Let your health care provider know about any changes, no matter how small.  If you are in your 20s or 30s, you should have a clinical breast exam (CBE) by a health care provider every 1-3 years as part of a regular health exam.  If you are 32 or older, have a CBE every year. Also consider having a breast X-ray (mammogram) every year.  If you have a  family history of breast cancer, talk to your health care provider about genetic screening.  If you are at high risk for breast cancer, talk to your health care provider about having an MRI and a mammogram every year.  Breast cancer gene (BRCA) assessment is recommended for women who have family members with BRCA-related cancers. BRCA-related cancers include: ? Breast. ? Ovarian. ? Tubal. ? Peritoneal cancers.  Results of the assessment will determine the need for genetic counseling and BRCA1 and BRCA2 testing.  Cervical Cancer Your health care  provider may recommend that you be screened regularly for cancer of the pelvic organs (ovaries, uterus, and vagina). This screening involves a pelvic examination, including checking for microscopic changes to the surface of your cervix (Pap test). You may be encouraged to have this screening done every 3 years, beginning at age 36.  For women ages 27-65, health care providers may recommend pelvic exams and Pap testing every 3 years, or they may recommend the Pap and pelvic exam, combined with testing for human papilloma virus (HPV), every 5 years. Some types of HPV increase your risk of cervical cancer. Testing for HPV may also be done on women of any age with unclear Pap test results.  Other health care providers may not recommend any screening for nonpregnant women who are considered low risk for pelvic cancer and who do not have symptoms. Ask your health care provider if a screening pelvic exam is right for you.  If you have had past treatment for cervical cancer or a condition that could lead to cancer, you need Pap tests and screening for cancer for at least 20 years after your treatment. If Pap tests have been discontinued, your risk factors (such as having a new sexual partner) need to be reassessed to determine if screening should resume. Some women have medical problems that increase the chance of getting cervical cancer. In these cases, your health care provider may recommend more frequent screening and Pap tests.  Colorectal Cancer  This type of cancer can be detected and often prevented.  Routine colorectal cancer screening usually begins at 60 years of age and continues through 60 years of age.  Your health care provider may recommend screening at an earlier age if you have risk factors for colon cancer.  Your health care provider may also recommend using home test kits to check for hidden blood in the stool.  A small camera at the end of a tube can be used to examine your colon  directly (sigmoidoscopy or colonoscopy). This is done to check for the earliest forms of colorectal cancer.  Routine screening usually begins at age 47.  Direct examination of the colon should be repeated every 5-10 years through 60 years of age. However, you may need to be screened more often if early forms of precancerous polyps or small growths are found.  Skin Cancer  Check your skin from head to toe regularly.  Tell your health care provider about any new moles or changes in moles, especially if there is a change in a mole's shape or color.  Also tell your health care provider if you have a mole that is larger than the size of a pencil eraser.  Always use sunscreen. Apply sunscreen liberally and repeatedly throughout the day.  Protect yourself by wearing long sleeves, pants, a wide-brimmed hat, and sunglasses whenever you are outside.  Heart disease, diabetes, and high blood pressure  High blood pressure causes heart disease and increases the risk of  stroke. High blood pressure is more likely to develop in: ? People who have blood pressure in the high end of the normal range (130-139/85-89 mm Hg). ? People who are overweight or obese. ? People who are African American.  If you are 34-76 years of age, have your blood pressure checked every 3-5 years. If you are 28 years of age or older, have your blood pressure checked every year. You should have your blood pressure measured twice-once when you are at a hospital or clinic, and once when you are not at a hospital or clinic. Record the average of the two measurements. To check your blood pressure when you are not at a hospital or clinic, you can use: ? An automated blood pressure machine at a pharmacy. ? A home blood pressure monitor.  If you are between 33 years and 77 years old, ask your health care provider if you should take aspirin to prevent strokes.  Have regular diabetes screenings. This involves taking a blood sample to  check your fasting blood sugar level. ? If you are at a normal weight and have a low risk for diabetes, have this test once every three years after 60 years of age. ? If you are overweight and have a high risk for diabetes, consider being tested at a younger age or more often. Preventing infection Hepatitis B  If you have a higher risk for hepatitis B, you should be screened for this virus. You are considered at high risk for hepatitis B if: ? You were born in a country where hepatitis B is common. Ask your health care provider which countries are considered high risk. ? Your parents were born in a high-risk country, and you have not been immunized against hepatitis B (hepatitis B vaccine). ? You have HIV or AIDS. ? You use needles to inject street drugs. ? You live with someone who has hepatitis B. ? You have had sex with someone who has hepatitis B. ? You get hemodialysis treatment. ? You take certain medicines for conditions, including cancer, organ transplantation, and autoimmune conditions.  Hepatitis C  Blood testing is recommended for: ? Everyone born from [birth year] through 1964. ? Anyone with known risk factors for hepatitis C.  Sexually transmitted infections (STIs)  You should be screened for sexually transmitted infections (STIs) including gonorrhea and chlamydia if: ? You are sexually active and are younger than 60 years of age. ? You are older than 60 years of age and your health care provider tells you that you are at risk for this type of infection. ? Your sexual activity has changed since you were last screened and you are at an increased risk for chlamydia or gonorrhea. Ask your health care provider if you are at risk.  If you do not have HIV, but are at risk, it may be recommended that you take a prescription medicine daily to prevent HIV infection. This is called pre-exposure prophylaxis (PrEP). You are considered at risk if: ? You are sexually active and do not regularly  use condoms or know the HIV status of your partner(s). ? You take drugs by injection. ? You are sexually active with a partner who has HIV.  Talk with your health care provider about whether you are at high risk of being infected with HIV. If you choose to begin PrEP, you should first be tested for HIV. You should then be tested every 3 months for as long as you are taking PrEP. Pregnancy  If  you are premenopausal and you may become pregnant, ask your health care provider about preconception counseling.  If you may become pregnant, take 400 to 800 micrograms (mcg) of folic acid every day.  If you want to prevent pregnancy, talk to your health care provider about birth control (contraception). Osteoporosis and menopause  Osteoporosis is a disease in which the bones lose minerals and strength with aging. This can result in serious bone fractures. Your risk for osteoporosis can be identified using a bone density scan.  If you are 83 years of age or older, or if you are at risk for osteoporosis and fractures, ask your health care provider if you should be screened.  Ask your health care provider whether you should take a calcium or vitamin D supplement to lower your risk for osteoporosis.  Menopause may have certain physical symptoms and risks.  Hormone replacement therapy may reduce some of these symptoms and risks. Talk to your health care provider about whether hormone replacement therapy is right for you. Follow these instructions at home:  Schedule regular health, dental, and eye exams.  Stay current with your immunizations.  Do not use any tobacco products including cigarettes, chewing tobacco, or electronic cigarettes.  If you are pregnant, do not drink alcohol.  If you are breastfeeding, limit how much and how often you drink alcohol.  Limit alcohol intake to no more than 1 drink per day for nonpregnant women. One drink equals 12 ounces of beer, 5 ounces of wine, or 1 ounces  of hard liquor.  Do not use street drugs.  Do not share needles.  Ask your health care provider for help if you need support or information about quitting drugs.  Tell your health care provider if you often feel depressed.  Tell your health care provider if you have ever been abused or do not feel safe at home. This information is not intended to replace advice given to you by your health care provider. Make sure you discuss any questions you have with your health care provider. Document Released: 12/15/2010 Document Revised: 11/07/2015 Document Reviewed: 03/05/2015 Elsevier Interactive Patient Education  [employer].

## 2017-04-14 NOTE — Progress Notes (Signed)
BP 120/70 (BP Location: Left Arm, Patient Position: Sitting, Cuff Size: Normal)   Pulse (!) 51   Temp 98 F (36.7 C) (Oral)   Ht 5\' 2"  (1.575 m)   Wt 146 lb (66.2 kg)   SpO2 97%   BMI 26.70 kg/m    CC: CPE Subjective:    Patient ID: Jacqueline Waters, female    DOB: 06-22-1955, 61 y.o.   MRN: [id number]  HPI: Jacqueline Waters is a 61 y.o. female presenting on 04/14/2017 for Annual Exam   Husband possibly with flu - on tamiflu at home. Pt asks about preventative treatment.  Preventative: COLONOSCOPY Date: 2010 WNL Olevia Perches)  Well woman with OBGYN Dr. Hulan Fray last 08/2016 Mammo - 09/2016, rpt 03/2017 R breast mass- benign, rec rpt 6 months bilat diag and R [place]. Flu shot at work  Tdap 2014  Seat belt use discussed. Sunscreen use discussed. No changing moles on skin.  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 Interim medical history since our last visit reviewed. Allergies and medications reviewed and updated. Outpatient Medications Prior to Visit  Medication Sig Dispense Refill  . BIOTIN PO Take 1 tablet by mouth daily.    . calcium carbonate (OS-CAL) 600 MG TABS tablet Take 600 mg by mouth daily with breakfast.    . cholecalciferol (VITAMIN D) 1000 units tablet Take 1,000 Units by mouth daily.    [name] estradiol (ESTRACE) 2 MG tablet Take 1 tablet (2 mg total) by mouth daily. 90 tablet 5  . furosemide (LASIX) 20 MG tablet TAKE 1 TABLET BY MOUTH ONCE DAILY AS NEEDED FOR LEG SWELLING    . Multiple Vitamins-Minerals (CENTRUM PO) Take 1 tablet by mouth daily.    . potassium chloride SA (KLOR-CON M20) 20 MEQ tablet TAKE 1 TABLET BY MOUTH  DAILY AS NEEDED WHEN TAKE LASIX.    [name] amLODipine-benazepril (LOTREL) 10-20 MG capsule Take 1 capsule by mouth daily. 90  capsule 0  . atorvastatin (LIPITOR) 20 MG tablet Take 1 tablet (20 mg total) by mouth every morning. OFFICE VISIT WITH LABS REQUIRED FOR ADDITIONAL REFILLS 30 tablet 0  . bisoprolol (ZEBETA) 5 MG tablet TAKE 1 TABLET BY MOUTH DAILY. 90 tablet 0  . spironolactone (ALDACTONE) 25 MG tablet Take 1 tablet (25 mg total) by mouth daily. 90 tablet 0   No facility-administered medications prior to visit.      Per HPI unless specifically indicated in ROS section below Review of Systems  Constitutional: Negative for activity change, appetite change, chills, fatigue, fever and unexpected weight change.  HENT: Negative for hearing loss.   Eyes: Negative for visual disturbance.  Respiratory: Negative for cough, chest tightness, shortness of breath and wheezing.   Cardiovascular: Negative for chest pain, palpitations and leg swelling.  Gastrointestinal: Negative for abdominal distention, abdominal pain, blood in stool, constipation, diarrhea, nausea and vomiting.  Genitourinary: Negative for difficulty urinating and hematuria.  Musculoskeletal: Negative for arthralgias, myalgias and neck pain.  Skin: Negative for rash.  Neurological: Negative for dizziness, seizures, syncope and headaches.  Hematological: Negative for adenopathy. Does not bruise/bleed easily.  Psychiatric/Behavioral: Negative for dysphoric mood. The patient is not nervous/anxious.        Objective:    BP 120/70 (BP Location: Left Arm, Patient Position: Sitting, Cuff Size: Normal)  Pulse (!) 51   Temp 98 F (36.7 C) (Oral)   Ht 5\' 2"  (1.575 m)   Wt 146 lb (66.2 kg)   SpO2 97%   BMI 26.70 kg/m   Wt Readings from Last 3 Encounters:  04/14/17 146 lb (66.2 kg)  09/08/16 145 lb (65.8 kg)  06/22/16 145 lb 12 oz (66.1 kg)    Physical Exam  Constitutional: She is oriented to person, place, and time. She appears well-developed and well-nourished. No distress.  HENT:  Head: Normocephalic and atraumatic.  Right Ear: Hearing,  tympanic membrane, external ear and ear canal normal.  Left Ear: Hearing, tympanic membrane, external ear and ear canal normal.  Nose: Nose normal.  Mouth/Throat: Uvula is midline, oropharynx is clear and moist and mucous membranes are normal. No oropharyngeal exudate, posterior oropharyngeal edema or posterior oropharyngeal erythema.  Eyes: Pupils are equal, round, and reactive to light. Conjunctivae and EOM are normal. No scleral icterus.  Neck: Normal range of motion. Neck supple. No thyromegaly present.  Cardiovascular: Normal rate, regular rhythm, normal heart sounds and intact distal pulses.   No murmur heard. Pulses:      Radial pulses are 2+ on the right side, and 2+ on the left side.  Pulmonary/Chest: Effort normal and breath sounds normal. No respiratory distress. She has no wheezes. She has no rales.  Abdominal: Soft. Bowel sounds are normal. She exhibits no distension and no mass. There is no tenderness. There is no rebound and no guarding.  Musculoskeletal: Normal range of motion. She exhibits no edema.  Lymphadenopathy:    She has no cervical adenopathy.  Neurological: She is alert and oriented to person, place, and time.  CN grossly intact, station and gait intact  Skin: Skin is warm and dry. No rash noted.  Psychiatric: She has a normal mood and affect. Her behavior is normal. Judgment and thought content normal.  Nursing note and vitals reviewed.  Results for orders placed or performed in visit on 04/08/17  Lipid panel  Result Value Ref Range   Cholesterol 206 (H) 0 - 200 mg/dL   Triglycerides 73.0 0.0 - 149.0 mg/dL   HDL 47.10 >39.00 mg/dL   VLDL 14.6 0.0 - 40.0 mg/dL   LDL Cholesterol 145 (H) 0 - 99 mg/dL   Total CHOL/HDL Ratio 4    NonHDL 694.85   Basic metabolic panel  Result Value Ref Range   Sodium 138 135 - 145 mEq/L   Potassium 4.8 3.5 - 5.1 mEq/L   Chloride 105 96 - 112 mEq/L   CO2 28 19 - 32 mEq/L   Glucose, Bld 121 (H) 70 - 99 mg/dL   BUN 21 6 - 23  mg/dL   Creatinine, Ser 1.03 0.40 - 1.20 mg/dL   Calcium 10.3 8.4 - 10.5 mg/dL   GFR 70.26 >60.00 mL/min  VITAMIN D 25 Hydroxy (Vit-D Deficiency, Fractures)  Result Value Ref Range   VITD 29.75 (L) 30.00 - 100.00 ng/mL      Assessment & Plan:   Problem List Items Addressed This Visit    Essential hypertension    Chronic, stable. Continue current regimen.       Relevant Medications   atorvastatin (LIPITOR) 20 MG tablet   amLODipine-benazepril (LOTREL) 10-20 MG capsule   bisoprolol (ZEBETA) 5 MG tablet   spironolactone (ALDACTONE) 25 MG tablet   Exposure to influenza    Discussed tamiflu preventatively and treatment dose. rec against taking if no sxs, WASP provided in case she develops sxs.  Health maintenance examination - Primary    Preventative protocols reviewed and updated unless pt declined. Discussed healthy diet and lifestyle.       Relevant Medications   bisoprolol (ZEBETA) 5 MG tablet   HYPERCHOLESTEROLEMIA    Chronic, ran out of statin over last few months. Strong fmhx CAD. rec restart.  The 10-year ASCVD risk score Mikey Bussing DC Brooke Bonito., et al., 2013) is: 6.4%   Values used to calculate the score:     Age: 78 years     Sex: Female     Is Non-Hispanic African American: Yes     Diabetic: No     Tobacco smoker: No     Systolic Blood Pressure: 035 mmHg     Is BP treated: Yes     HDL Cholesterol: 47.1 mg/dL     Total Cholesterol: 206 mg/dL        Relevant Medications   atorvastatin (LIPITOR) 20 MG tablet   amLODipine-benazepril (LOTREL) 10-20 MG capsule   bisoprolol (ZEBETA) 5 MG tablet   spironolactone (ALDACTONE) 25 MG tablet   Vitamin D deficiency    She stopped replacement and levels have waned. rec restart 1000 IU daily.           Follow up plan: Return in about 1 year (around 04/14/2018) for annual exam, prior fasting for blood work.  Ria Bush, MD

## 2017-04-14 NOTE — Assessment & Plan Note (Signed)
Preventative protocols reviewed and updated unless pt declined. Discussed healthy diet and lifestyle.  

## 2017-04-14 NOTE — Assessment & Plan Note (Signed)
Discussed tamiflu preventatively and treatment dose. rec against taking if no sxs, WASP provided in case she develops sxs.

## 2017-04-14 NOTE — Assessment & Plan Note (Signed)
Chronic, stable. Continue current regimen. 

## 2017-04-14 NOTE — Assessment & Plan Note (Signed)
Chronic, ran out of statin over last few months. Strong fmhx CAD. rec restart.  The 10-year ASCVD risk score Mikey Bussing DC Brooke Bonito., et al., 2013) is: 6.4%   Values used to calculate the score:     Age: 61 years     Sex: Female     Is Non-Hispanic African American: Yes     Diabetic: No     Tobacco smoker: No     Systolic Blood Pressure: 794 mmHg     Is BP treated: Yes     HDL Cholesterol: 47.1 mg/dL     Total Cholesterol: 206 mg/dL

## 2017-04-15 MED FILL — ATORVASTATIN 20 MG TABLET: 20 | 90 days supply | Qty: 90 | Fill #0

## 2017-04-28 MED FILL — ESTRADIOL 2 MG TABLET: 2 | 90 days supply | Qty: 90 | Fill #0

## 2017-06-25 ENCOUNTER — Telehealth: Payer: Self-pay | Admitting: Family Medicine

## 2017-06-25 MED FILL — AMLODIPINE-BENAZEPRIL 10-20: 10-20 | 90 days supply | Qty: 90 | Fill #0

## 2017-06-25 NOTE — Telephone Encounter (Signed)
Received call from [facility] stating pt has picked up rx.

## 2017-06-25 NOTE — Telephone Encounter (Signed)
Left message on vm notifying pt rx was sent to [facility], 1 yrs worth. Asked pt to call back to confirm she received this vm and to see if she was able to get rx.

## 2017-06-25 NOTE — Telephone Encounter (Signed)
plz touch base with patient. It was sent for a year supply 03/2017. If she needs it refilled ok to do #90 RF3 but would have her check with [facility] first. Does she need other meds refilled?

## 2017-06-25 NOTE — Telephone Encounter (Signed)
Rx was sent to [facility] 04/14/17, 90 days with 3 refills, which will take pt to 03/2018.

## 2017-06-25 NOTE — Telephone Encounter (Signed)
Patient called to request her 90 day RX for Amlodipine 10-20 be sent to [facility] ASAP. She is going out of town and needs to pick it up today if possible. Please call patient when sent at her work # [phone]. She just had a physical with Dr Darnell Level and thought it was already sent but it wasn't. This is her New RX for the year.

## 2017-07-04 IMAGING — MG DIGITAL SCREENING BILATERAL MAMMOGRAM WITH CAD
5 series · 5 of 5 positions shown · non-contrast
Comparison: Previous exam(s).

CLINICAL DATA: Screening.

EXAM:
DIGITAL SCREENING BILATERAL MAMMOGRAM WITH CAD

[R CC]
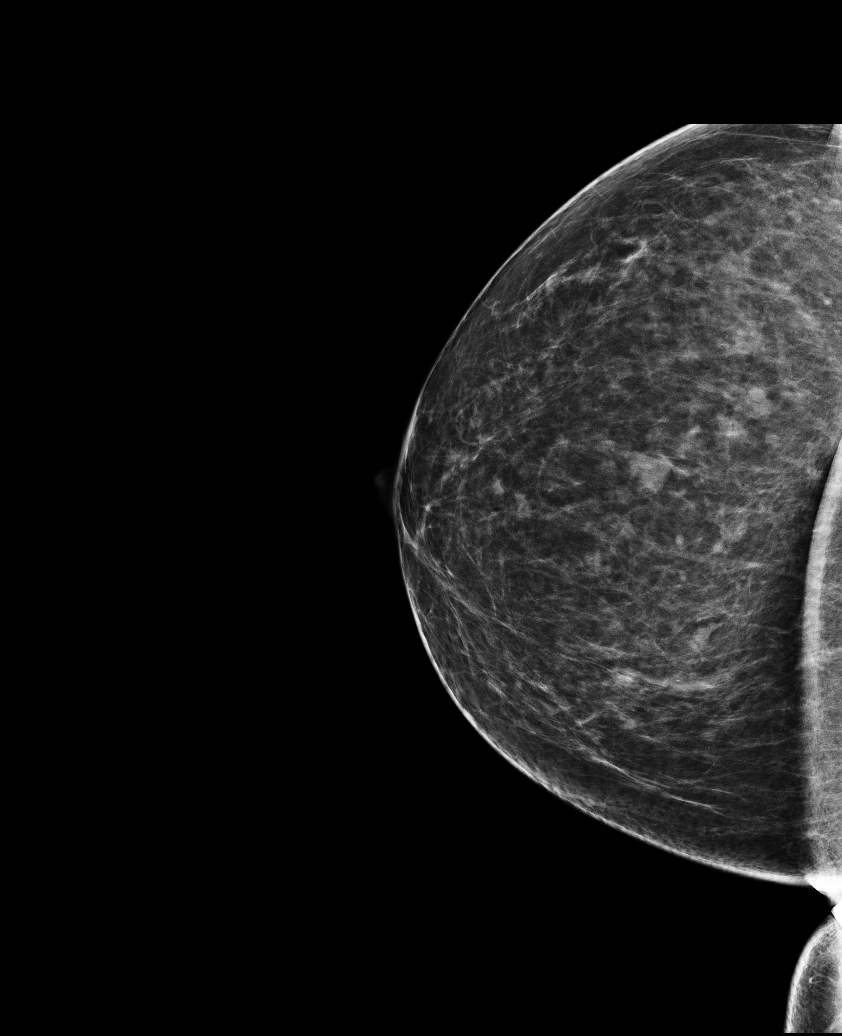

[L MLO]
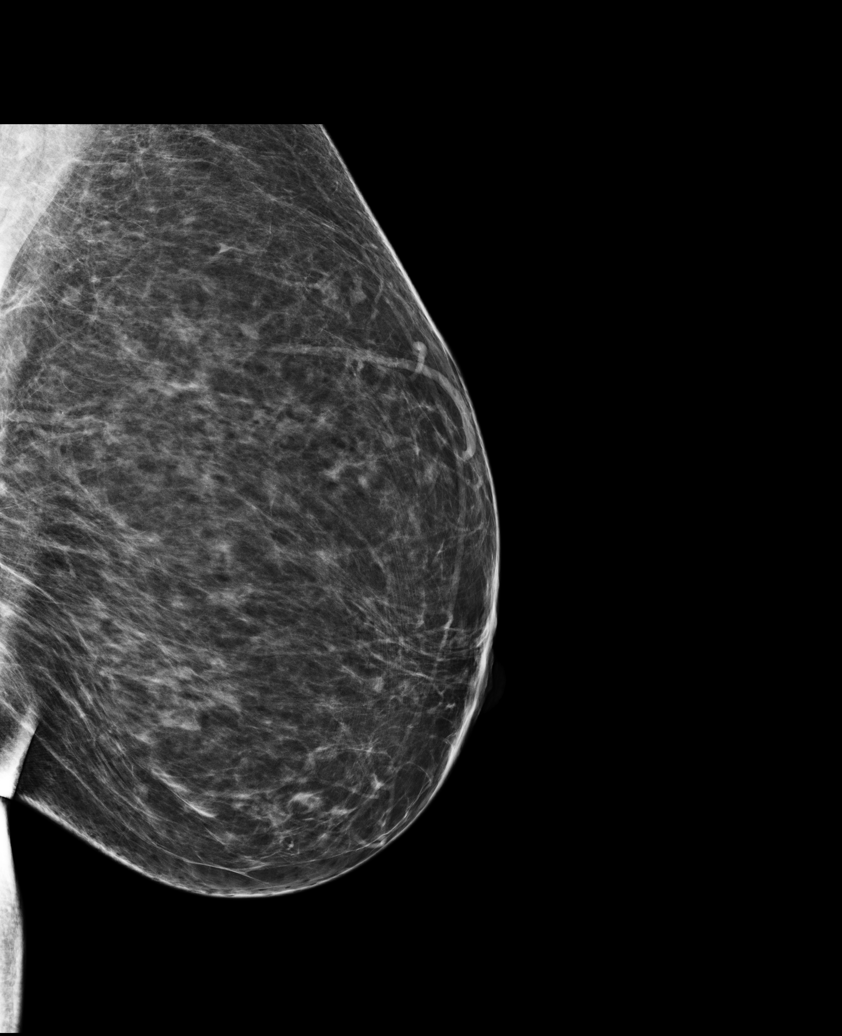

[L CC]
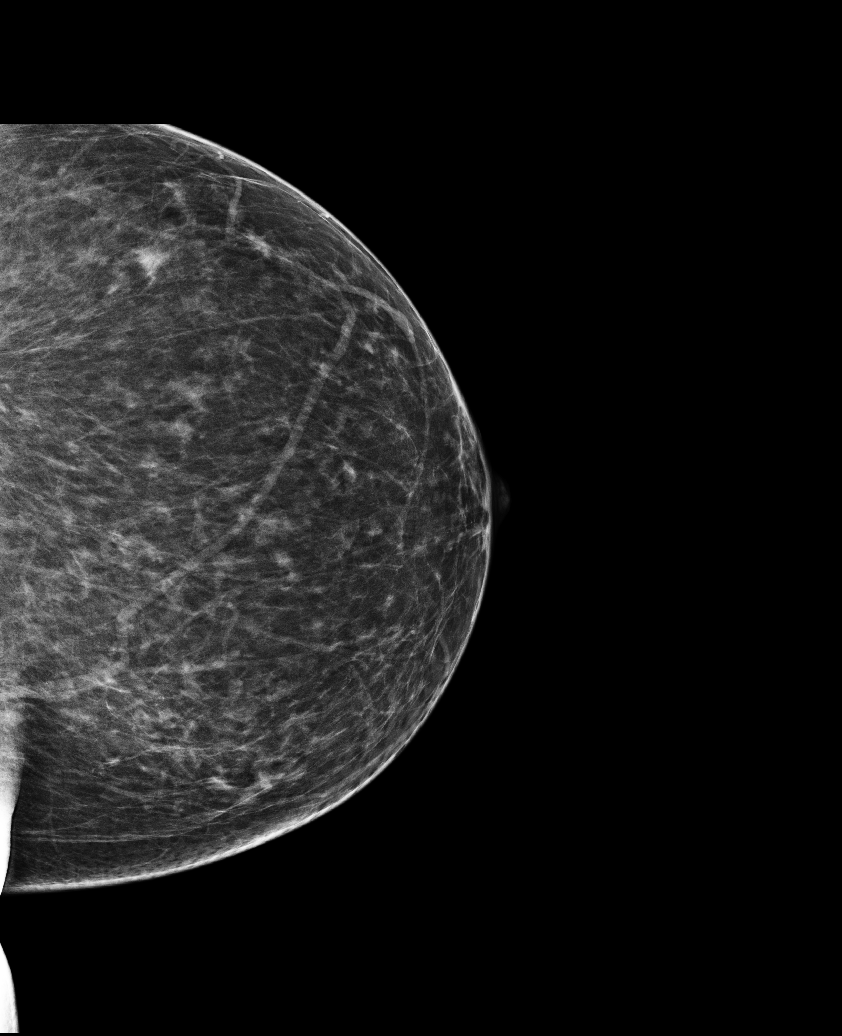

[R MLO (1 of 2)]
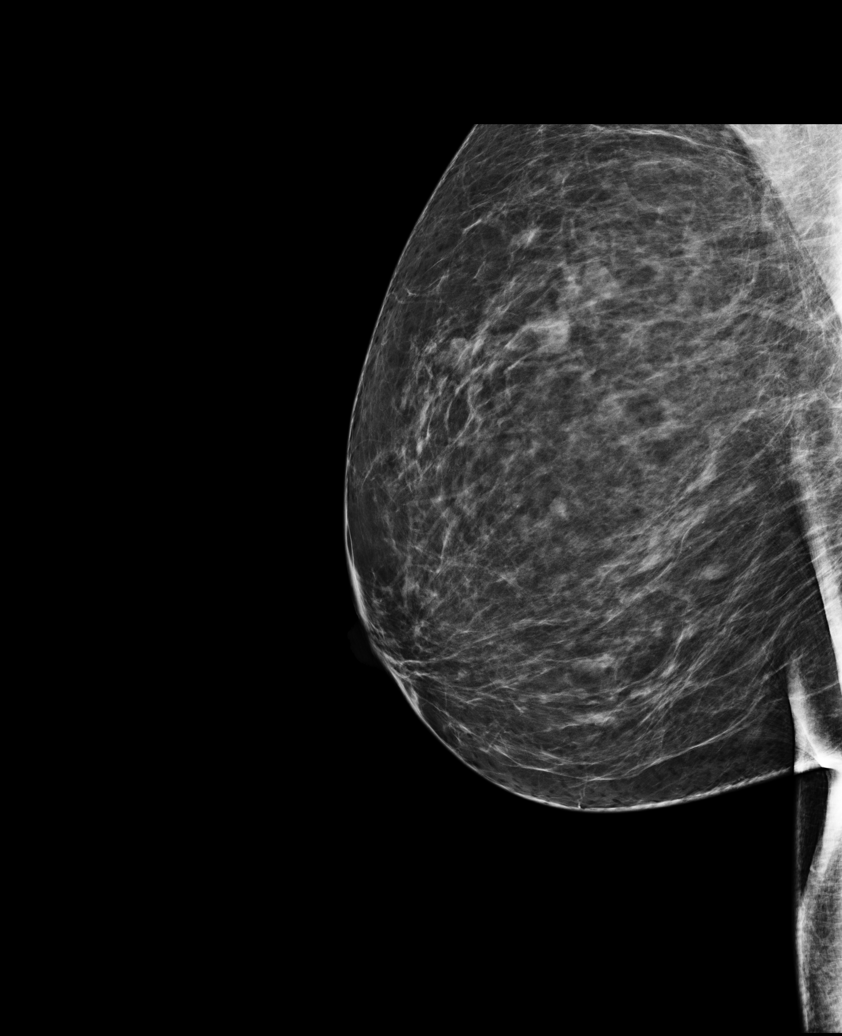

[R MLO (2 of 2)]
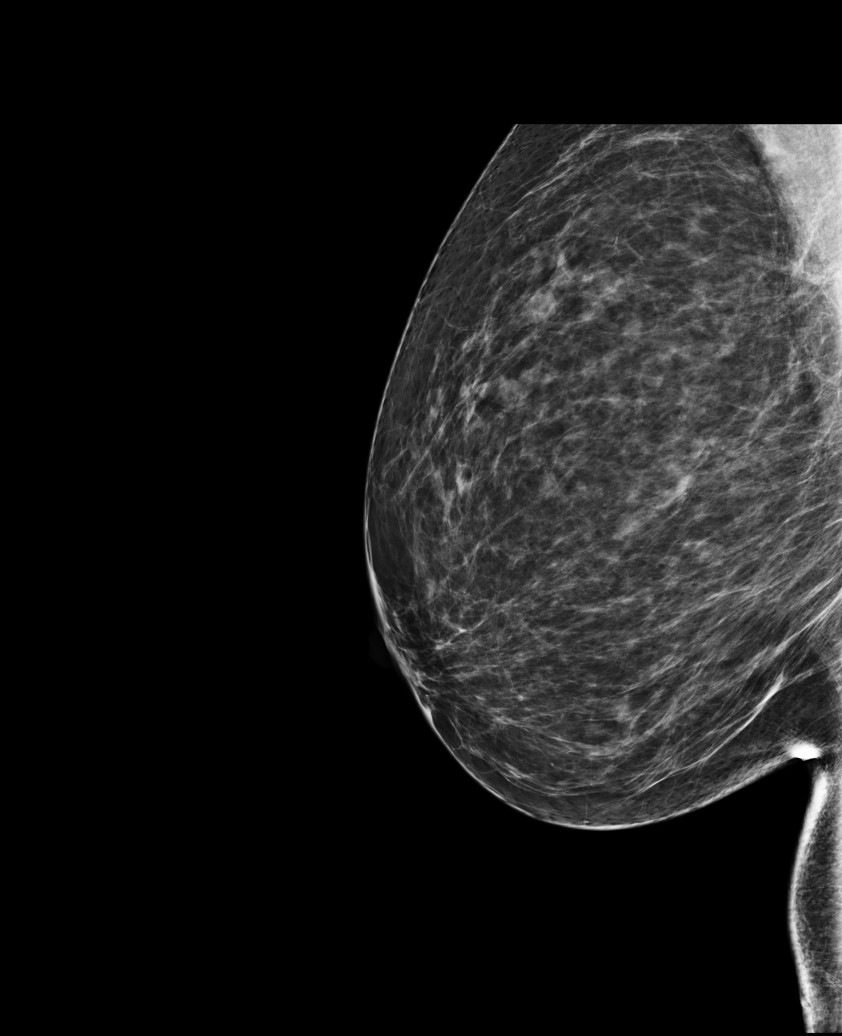

[5 of 5 positions shown; findings below may reference images not displayed]

ACR Breast Density Category b: There are scattered areas of
fibroglandular density.
FINDINGS: In the right breast, a possible mass warrants further evaluation. In
the left breast, no findings suspicious for malignancy. Images were
processed with CAD.
IMPRESSION: Further evaluation is suggested for possible mass in the right
breast.

RECOMMENDATION:
Diagnostic mammogram and possibly ultrasound of the right breast.
(Code:[id number])

The patient will be contacted regarding the findings, and additional
imaging will be scheduled.

BI-RADS CATEGORY  0: Incomplete. Need additional imaging evaluation
and/or prior mammograms for comparison.

## 2017-07-19 MED FILL — SPIRONOLACTONE 25 MG TABLET: 25 | 90 days supply | Qty: 90 | Fill #0

## 2017-08-27 MED FILL — ATORVASTATIN 20 MG TABLET: 20 | 90 days supply | Qty: 90 | Fill #1

## 2017-08-27 MED FILL — BISOPROLOL FUMARATE 5 MG TA: 5 | 90 days supply | Qty: 90 | Fill #0

## 2017-09-09 ENCOUNTER — Encounter: Payer: Self-pay | Admitting: Internal Medicine

## 2017-09-09 ENCOUNTER — Ambulatory Visit: Payer: 59 | Admitting: Internal Medicine

## 2017-09-09 ENCOUNTER — Ambulatory Visit (INDEPENDENT_AMBULATORY_CARE_PROVIDER_SITE_OTHER): Payer: 59 | Admitting: Internal Medicine

## 2017-09-09 VITALS — BP 136/80 | HR 60 | Temp 98.4°F | Wt 146.0 lb

## 2017-09-09 DIAGNOSIS — J302 Other seasonal allergic rhinitis: Secondary | ICD-10-CM | POA: Diagnosis not present

## 2017-09-09 DIAGNOSIS — R42 Dizziness and giddiness: Secondary | ICD-10-CM

## 2017-09-09 NOTE — Patient Instructions (Signed)

## 2017-09-09 NOTE — Progress Notes (Signed)
Subjective:    Patient ID: Jacqueline Waters, female    DOB: [birth year], 62 y.o.   MRN: [id number]  HPI  Pt presents to the clinic today with c/o dizziness. This started 2 days ago. It is constant but not severe. She describes the dizziness as a senses of unsteadiness, not that the room is spinning. She has had some nasal congestion, sneezing, sore throat, and cough. She is blowing yellow mucous out of her nose. She denies difficulty swallowing. The cough is non productive. She denies fever, chills or body aches. She denies changes in medication, diet or activity level. She has not tried anything OTC for her symptoms. She does have a history of HTN, controlled with multiple meds. She is concerned that her BP readings are lower at home.  Review of Systems      Past Medical History:  Diagnosis Date  . Breast cyst   . Essential hypertension   . HLD (hyperlipidemia)   . Vaginal delivery    x3  . VITAMIN D DEFICIENCY 07/16/2009   Qualifier: Diagnosis of  By: Royal Piedra NP, Tammy      Current Outpatient Medications  Medication Sig Dispense Refill  . amLODipine-benazepril (LOTREL) 10-20 MG capsule Take 1 capsule by mouth daily. 90 capsule 3  . atorvastatin (LIPITOR) 20 MG tablet Take 1 tablet (20 mg total) by mouth every morning. 90 tablet 3  . BIOTIN PO Take 1 tablet by mouth daily.    . bisoprolol (ZEBETA) 5 MG tablet Take 1 tablet (5 mg total) by mouth daily. 90 tablet 3  . calcium carbonate (OS-CAL) 600 MG TABS tablet Take 600 mg by mouth daily with breakfast.    . cholecalciferol (VITAMIN D) 1000 units tablet Take 1,000 Units by mouth daily.    [name] estradiol (ESTRACE) 2 MG tablet Take 1 tablet (2 mg total) by mouth daily. 90 tablet 5  . furosemide (LASIX) 20 MG tablet TAKE 1 TABLET BY MOUTH ONCE DAILY AS NEEDED FOR LEG SWELLING    . Multiple Vitamins-Minerals (CENTRUM PO) Take 1 tablet by mouth daily.    [name] spironolactone (ALDACTONE) 25 MG tablet Take 1 tablet (25 mg total) by mouth daily. 90  tablet 3  . potassium chloride SA (KLOR-CON M20) 20 MEQ tablet TAKE 1 TABLET BY MOUTH  DAILY AS NEEDED WHEN TAKE LASIX.     No current facility-administered medications for this visit.     No Known Allergies  Family History  Problem Relation Age of Onset  . Hypertension Mother   . CAD Mother 60       s/p CABG  . Stroke Mother   . Hypertension Father   . CAD Father 72       deceased from MI  . Stroke Father        left paralysis  . Diabetes Sister   . Cancer Sister 62       breast    Social History   Socioeconomic History  . Marital status: Married    Spouse name: Not on file  . Number of children: Not on file  . Years of education: Not on file  . Highest education level: Not on file  Occupational History  . Not on file  Social Needs  . Financial resource strain: Not on file  . Food insecurity:    Worry: Not on file    Inability: Not on file  . Transportation needs:    Medical: Not on file    Non-medical: Not on  file  Tobacco Use  . Smoking status: Never Smoker  . Smokeless tobacco: Never Used  Substance and Sexual Activity  . Alcohol use: No    Alcohol/week: 0.0 oz  . Drug use: No  . Sexual activity: Yes    Partners: Male    Birth control/protection: Surgical  Lifestyle  . Physical activity:    Days per week: Not on file    Minutes per session: Not on file  . Stress: Not on file  Relationships  . Social connections:    Talks on phone: Not on file    Gets together: Not on file    Attends religious service: Not on file    Active member of club or organization: Not on file    Attends meetings of clubs or organizations: Not on file    Relationship status: Not on file  . Intimate partner violence:    Fear of current or ex partner: Not on file    Emotionally abused: Not on file    Physically abused: Not on file    Forced sexual activity: Not on file  Other Topics Concern  . Not on file  Social History Narrative   Lives with husband   Grown children     Occupation: site Freight forwarder at [employer]   Activity: walks regularly (walk/run program)   Diet: good water, fruits/vegetables daily     Constitutional: Denies fever, malaise, fatigue, headache or abrupt weight changes.  HEENT: Pt reports nasal congestion, sore throat. Denies eye pain, eye redness, ear pain, ringing in the ears, wax buildup, runny nose, bloody nose. Respiratory: Pt reports cough. Denies difficulty breathing, shortness of breath, or sputum production.   Cardiovascular: Denies chest pain, chest tightness, palpitations or swelling in the hands or feet.  Neurological: Pt reports dizziness. Denies difficulty with memory, difficulty with speech or problems with balance and coordination.    No other specific complaints in a complete review of systems (except as listed in HPI above).  Objective:   Physical Exam  BP 136/80 (BP Location: Left Arm, Patient Position: Sitting, Cuff Size: Normal)   Pulse 60   Temp 98.4 F (36.9 C) (Oral)   Wt 146 lb (66.2 kg)   SpO2 100%   BMI 26.70 kg/m  Wt Readings from Last 3 Encounters:  09/09/17 146 lb (66.2 kg)  04/14/17 146 lb (66.2 kg)  09/08/16 145 lb (65.8 kg)    General: Appears her stated age, in NAD. HEENT: Head: normal shape and size, no sinus tenderness noted; Eyes: PERRLA and EOMs intact; Ears: Tm's retracted but intact, normal light reflex; Throat/Mouth: Teeth present, mucosa erythmatous and moist, + PND, no exudate, lesions or ulcerations noted.  Cardiovascular: Normal rate and rhythm.  Pulmonary/Chest: Normal effort and positive vesicular breath sounds. No respiratory distress. No wheezes, rales or ronchi noted.  Neurological: Alert and oriented. Coordination normal.  Psychiatric: Mood and affect normal. Behavior is normal. Judgment and thought content normal.    BMET    Component Value Date/Time   NA 138 04/08/2017 0947   K 4.8 04/08/2017 0947   CL 105 04/08/2017 0947   CO2 28 04/08/2017 0947   GLUCOSE  121 (H) 04/08/2017 0947   BUN 21 04/08/2017 0947   CREATININE 1.03 04/08/2017 0947   CALCIUM 10.3 04/08/2017 0947   GFRNONAA 96.72 07/12/2009 1013   GFRAA 75 06/29/2007 1016    Lipid Panel     Component Value Date/Time   CHOL 206 (H) 04/08/2017 0947   TRIG  73.0 04/08/2017 0947   HDL 47.10 04/08/2017 0947   CHOLHDL 4 04/08/2017 0947   VLDL 14.6 04/08/2017 0947   LDLCALC 145 (H) 04/08/2017 0947    CBC    Component Value Date/Time   WBC 4.4 (L) 07/25/2013 1021   RBC 4.81 07/25/2013 1021   HGB 14.2 07/25/2013 1021   HCT 42.4 07/25/2013 1021   PLT 282.0 07/25/2013 1021   MCV 88.2 07/25/2013 1021   MCHC 33.5 07/25/2013 1021   RDW 13.2 07/25/2013 1021   LYMPHSABS 1.7 07/25/2013 1021   MONOABS 0.4 07/25/2013 1021   EOSABS 0.0 07/25/2013 1021   BASOSABS 0.0 07/25/2013 1021    Hgb A1C No results found for: HGBA1C          Assessment & Plan:   Dizziness, Allergies:  Orthostatics: negative Encouraged her to push fluids Advised her to start Allegra and Flonase OTC Can use Meclizine OTC as needed for dizziness Offered 80 mg Depo IM today, but she declined  Return precautions discussed Webb Silversmith, NP

## 2017-09-22 ENCOUNTER — Ambulatory Visit
Admission: RE | Admit: 2017-09-22 | Discharge: 2017-09-22 | Disposition: A | Discharge status: Home / Self Care | Payer: 59 | Source: Ambulatory Visit | Attending: Family Medicine | Admitting: Family Medicine

## 2017-09-22 DIAGNOSIS — N6312 Unspecified lump in the right breast, upper inner quadrant: Secondary | ICD-10-CM | POA: Diagnosis not present

## 2017-09-22 DIAGNOSIS — N6310 Unspecified lump in the right breast, unspecified quadrant: Secondary | ICD-10-CM

## 2017-09-22 DIAGNOSIS — R928 Other abnormal and inconclusive findings on diagnostic imaging of breast: Secondary | ICD-10-CM | POA: Diagnosis not present

## 2017-10-14 ENCOUNTER — Other Ambulatory Visit: Payer: Self-pay | Admitting: Obstetrics & Gynecology

## 2017-10-14 DIAGNOSIS — E8940 Asymptomatic postprocedural ovarian failure: Secondary | ICD-10-CM

## 2017-10-14 DIAGNOSIS — Z79890 Hormone replacement therapy (postmenopausal): Secondary | ICD-10-CM

## 2017-10-14 MED FILL — AMLODIPINE-BENAZEPRIL 10-20: 10-20 | 90 days supply | Qty: 90 | Fill #1

## 2017-10-15 MED FILL — ESTRADIOL 2 MG TABLET: 2 | 90 days supply | Qty: 90 | Fill #0

## 2017-11-16 MED FILL — SPIRONOLACTONE 25 MG TABLET: 25 | 90 days supply | Qty: 90 | Fill #1

## 2017-12-17 MED FILL — ESTRADIOL 2 MG TABLET: 2 | 90 days supply | Qty: 90 | Fill #1

## 2017-12-17 MED FILL — BISOPROLOL FUMARATE 5 MG TA: 5 | 90 days supply | Qty: 90 | Fill #1

## 2018-02-04 DIAGNOSIS — H524 Presbyopia: Secondary | ICD-10-CM | POA: Diagnosis not present

## 2018-02-04 DIAGNOSIS — H5203 Hypermetropia, bilateral: Secondary | ICD-10-CM | POA: Diagnosis not present

## 2018-02-04 DIAGNOSIS — H52203 Unspecified astigmatism, bilateral: Secondary | ICD-10-CM | POA: Diagnosis not present

## 2018-02-15 MED FILL — AMLODIPINE-BENAZEPRIL 10-20: 10-20 | 90 days supply | Qty: 90 | Fill #2

## 2018-03-11 MED FILL — SPIRONOLACTONE 25 MG TABLET: 25 | 90 days supply | Qty: 90 | Fill #2

## 2018-03-11 MED FILL — ESTRADIOL 2 MG TABLET: 2 | 90 days supply | Qty: 90 | Fill #2

## 2018-05-05 ENCOUNTER — Other Ambulatory Visit: Payer: Self-pay | Admitting: Family Medicine

## 2018-05-05 DIAGNOSIS — Z0000 Encounter for general adult medical examination without abnormal findings: Secondary | ICD-10-CM

## 2018-05-05 MED FILL — BISOPROLOL FUMARATE 5 MG TA: 5 | 90 days supply | Qty: 90 | Fill #0

## 2018-06-08 LAB — HEMOGLOBIN A1C: Hemoglobin A1C, External: 5.9 % (ref 4.8–6.0)

## 2018-06-21 NOTE — Unmapped (Signed)
OUTPATIENT PHYSICAL THERAPY   Discharge Summary  NOTE  OUTPATIENT THERAPY  [address]  [address] [place], [place] [address]  Office [phone] Fax [phone]      Name: Krista Fletcher  MRN: [id number]  DOB:  03-26-1956  Referring Provider: Lorrene Reid, FNP  Next MD Visit:  Saw ortho last Tuesday; to return in 3 months   Diagnosis:     ICD-9-CM ICD-10-CM   1. Right hip pain 719.45 M25.551     Allergies:   Allergies   Allergen Reactions   . Sulfa Antibiotics Hives     Meds:  Current Outpatient Medications:   .  amLODIPine (NORVASC) 10 MG tablet, Take 1 tablet (10 mg total) by mouth daily, Disp: 90 tablet, Rfl: 1  .  meloxicam (MOBIC) 15 MG tablet, Take 1 tablet (15 mg total) by mouth daily, Disp: 30 tablet, Rfl: 5  .  telmisartan-hydrochlorothiazide (MICARDIS HCT) 80-25 MG per tablet, Take 1 tablet by mouth daily, Disp: 90 tablet, Rfl: 1  .  Vitamin D, Cholecalciferol, 1000 units capsule, Take by mouth., Disp: , Rfl:   Past Medical Hx:   Past Medical History:   Diagnosis Date   . Hypertension      Past Surgical Hx:   Past Surgical History:   Procedure Laterality Date   . ADENOIDECTOMY  2013   . OVARIAN CYST REMOVAL  2005         Plan Of Care Dates: 05/19/18 through 08/15/18   Eval signed yes     Subjective:  I have been doing the water aerobics, more convenient for my schedule than the PT".  Pt is a member at [employer].   "I had the injection last Tuesday, no help.  She said I would know when I need that THR, I plan to go over Spring break."  Pt reports "the water is helpful, until I get out, but overall it has helped some, I am walking a little better and can walk longer".  Pt reports she has lost 8 lbs over the past month, working on diet with her MD.         Flowsheet      Date 05/19/18  Eval 05/30/18 06/09/18 06/21/18              VISIT COUNTER 1/10 2/10 3/10 4/10            Insurance                        CAP/auth. Counter                         Auth. Expiration Date                         Pain level: current  4/10 6-7/10 6/10            Pain level: worst in last week                        Manual  Land Exercise                                                                                                                                Aquatic  Exercise                5 min format  1x 1x             REIS  15x 15x             3 way hip  10x 12x             LAQ/HSCurl  10x ea 12x             Hip knee combo  10x 12x             Step up  10x 12x             Squats  10x 12x             SLS   2x20"B             Lats/Rows/HzAbd/Samarai   Multi  15x                             Gait: F,R,SS  2x  3x  shallow                    Modalities                                  Education:  Correct exercise technique    OBJECTIVE    Observation/Postural Assessment:       Standing  lower R PSIS and crest with marked genu varus, and unable to fully extend B knees, stands with weight on RLE;      ROM    LUMBAR     Flexion full    Extension Major loss at lower Lspine      Palpation:  Tender at L groin;  Mild pain with compression at L greater trochanter.  Neurologicial:  Sensation: intact to light touch        STRENGTH and AROM (all measurements out of 5)   LEFT RIGHT   HIP flexion 100* AROM and painful end range; 3- painful 4+   Knee Flexion 5 5   Knee Extension 5 5;now -10* on R knee    Ankle DF 5 5   Ankle Inversion 5 5   Hip ER 4- no longer painful 42* AROM; 5/5 strength   Hip IR  4- no longer painful 28* AROM; 5/5 strength   Hip abd 3+ tested in sidelying    Hip ext AAROm to neutral at best with end range pain; 3-/5 strength              Special Tests:    Pt now with -10* R knee extension, has improved 7*.    At eval  (pt requests d/c today and is not wanting to delve into SIJ METs and heel lift trial, wants to keep losign weight and undergo THR in a few months - did recommend she make sure surgeon considers leg length discrepancy):    (+) L FABER test for pain and impaired AROM  + scour test L hip    Unable to perform s/l clamshell at all due to pain    Pt with -17* R knee extension and signs of longer LLE - with elevated crest and PSIS on L in stand and L upslip in supine with longer LLE prior to and after upslip correction; adipose makes some landmarks difficult to palpate ie to determine ant/post rotation at SIJ.  Plan to have her work on increasing R knee ext to [facility] and then will reassess need for R heel lift.   Decreased height of SPC x 2 levels and antalgic gait significantly improved; however, still with some excessive lateral sway with evident lack of R knee ext at terminal stance.            Assessment: Pt has made slight improvement in FOTO, and pain reduction; has a significant improvement in strength in LLE.  Pt has met 2 of 3 STG and no LTG; pt attended only 2 PT sessions this past month since eval and has been doing aqua aerobics classes instead, is wanting to d/c to an (I) program due to her schedule.   Goals:  Short Term Goals: Time frame to achieve short term goal(s): 4 weeks   1. Pt will report at least 20% decrease in L hip pain in order to return to sleeping throughout the night.  = MET:    "I am walking better, picking up my pace now, with the rain today it is feeling yucky".   2. Pt will be competent and compliant with current HEP.  = MET  3. Pt will report increased FOTO  score by at least 5 points. 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 Pt will report at least 50%  decrease in L hip pain in order to return to improved walking tolerance. (guarded given need for THA) = not met  2. Pt will be competent and compliant with a d/c HEP.  = not met  3. Pt will present with increased R knee ext to WNL in order to promote improved gait.  = not met  4. Pt will  report increased FOTO  score by at least 8 points.  = not met  5.  Pt will present with increased L hip strength by at least 1 MMT  In order to promote improved stair management.  = not met            Plan   Discontinue treatment as of this visit      Electronically Signed By: Ladene Artist, PT 06/21/2018 4:03 PM

## 2018-08-10 ENCOUNTER — Other Ambulatory Visit: Payer: Self-pay | Admitting: Family Medicine

## 2018-08-10 MED FILL — ATORVASTATIN 20 MG TABLET: 20 | 90 days supply | Qty: 90 | Fill #0

## 2018-08-10 MED FILL — AMLODIPINE-BENAZEPRIL 10-20: 10-20 | 90 days supply | Qty: 90 | Fill #0

## 2018-08-18 ENCOUNTER — Telehealth: Payer: Self-pay | Admitting: Family Medicine

## 2018-08-18 NOTE — Telephone Encounter (Signed)
Left message asking pt to call office please r/s 4/3 appointment with dr g °

## 2018-08-19 NOTE — Telephone Encounter (Signed)
Pt left v/m; pt has been taking cholesterol med for sometime and somehow pt stopped taking atorvastatin 20 mg by error. Pt restarted the atorvastatin last Wed. Since restarting atorvastatin 20 mg pt has had funny feeling in head like pressure; that is not normal. Pt request cb to make sure pt was given the med Dr Darnell Level wanted pt to take.

## 2018-08-19 NOTE — Telephone Encounter (Signed)
Left message on vm for pt to call back.  

## 2018-08-19 NOTE — Telephone Encounter (Addendum)
Yes she should be on atorvastatin 20mg .  Would also ensure doing ok with her BP meds and that her blood pressures aren't running too high.  Would be strange to start having side effects from atorvastatin after previously tolerated. She could try taking MWF instead of daily.

## 2018-08-19 NOTE — Telephone Encounter (Signed)
Spoke with the pt about the atorvastatin. She basically restated everything in previous message. Has not taken med today and feels a little better. Pls advise.

## 2018-08-22 NOTE — Telephone Encounter (Signed)
Left message on vm for pt to call back.  Need to relay Dr. G's message.  

## 2018-08-23 NOTE — Telephone Encounter (Signed)
Spoke with pt relaying Dr. Synthia Innocent message. Pt confirms she is taking all BP meds. Scheduled OV on 08/25/18 at 9:00 AM. Fyi to Dr. Darnell Level.

## 2018-08-23 NOTE — Telephone Encounter (Signed)
Spoke with pt relaying Dr. Synthia Innocent message.  Pt states because of the pressure in head, she has stopped the atorvastatin and feels a little bit better.  She wonders if it may be sinus related. Says she is taking her BP meds and BP has been running 120s/higher 90s. Give permission to detailed vm with any response from Dr. Darnell Level.

## 2018-08-23 NOTE — Telephone Encounter (Signed)
Diastolics are too high. That could also contribute to headache. Would ensure taking BP meds as on list and offer OV to review.

## 2018-08-24 ENCOUNTER — Other Ambulatory Visit: Payer: Self-pay | Admitting: Family Medicine

## 2018-08-24 DIAGNOSIS — N63 Unspecified lump in unspecified breast: Secondary | ICD-10-CM

## 2018-08-25 ENCOUNTER — Ambulatory Visit: Payer: 59 | Admitting: Family Medicine

## 2018-08-25 ENCOUNTER — Encounter: Payer: Self-pay | Admitting: Family Medicine

## 2018-08-25 ENCOUNTER — Other Ambulatory Visit: Payer: Self-pay

## 2018-08-25 DIAGNOSIS — I10 Essential (primary) hypertension: Secondary | ICD-10-CM | POA: Diagnosis not present

## 2018-08-25 DIAGNOSIS — J3489 Other specified disorders of nose and nasal sinuses: Secondary | ICD-10-CM

## 2018-08-25 DIAGNOSIS — E7800 Pure hypercholesterolemia, unspecified: Secondary | ICD-10-CM

## 2018-08-25 DIAGNOSIS — G501 Atypical facial pain: Secondary | ICD-10-CM | POA: Diagnosis not present

## 2018-08-25 DIAGNOSIS — E785 Hyperlipidemia, unspecified: Secondary | ICD-10-CM | POA: Diagnosis not present

## 2018-08-25 MED ORDER — FLUTICASONE PROPIONATE 50 MCG/ACT NA SUSP: 2.0000 | Freq: Every day | NASAL | 1 refills | Status: DC

## 2018-08-25 NOTE — Progress Notes (Deleted)
There were no vitals taken for this visit.   CC: HTN f/u Subjective:    Patient ID: Jacqueline Waters, female    DOB: [birth year], 63 y.o.   MRN: [id number]  HPI: Jacqueline Waters is a 63 y.o. female presenting on 08/25/2018 for No chief complaint on file.   See recent phone note for details.  HTN - Compliant with current antihypertensive regimen of amlodipine/benazepril 10/20mg  daily, bisoprolol 5mg  daily, lasix 20mg  daily, spironolactone 25mg  daily. Does *** check blood pressures at home: ***. No low blood pressure readings or symptoms of dizziness/syncope. Denies HA, vision changes, CP/tightness, SOB, leg swelling.        Relevant past medical, surgical, family and social history reviewed and updated as indicated. Interim medical history since our last visit reviewed. Allergies and medications reviewed and updated. Outpatient Medications Prior to Visit  Medication Sig Dispense Refill  . amLODipine-benazepril (LOTREL) 10-20 MG capsule TAKE 1 CAPSULE BY MOUTH DAILY. 90 capsule 0  . atorvastatin (LIPITOR) 20 MG tablet TAKE 1 TABLET BY MOUTH EVERY MORNING. 90 tablet 0  . BIOTIN PO Take 1 tablet by mouth daily.    . bisoprolol (ZEBETA) 5 MG tablet TAKE 1 TABLET BY MOUTH DAILY. 90 tablet 0  . calcium carbonate (OS-CAL) 600 MG TABS tablet Take 600 mg by mouth daily with breakfast.    . cholecalciferol (VITAMIN D) 1000 units tablet Take 1,000 Units by mouth daily.    [name] estradiol (ESTRACE) 2 MG tablet TAKE 1 TABLET BY MOUTH DAILY. 90 tablet 5  . furosemide (LASIX) 20 MG tablet TAKE 1 TABLET BY MOUTH ONCE DAILY AS NEEDED FOR LEG SWELLING    . Multiple Vitamins-Minerals (CENTRUM PO) Take 1 tablet by mouth daily.    . potassium chloride SA (KLOR-CON M20) 20 MEQ tablet TAKE 1 TABLET BY MOUTH  DAILY AS NEEDED WHEN TAKE LASIX.    [name] spironolactone (ALDACTONE) 25 MG tablet Take 1 tablet (25 mg total) by mouth daily. 90 tablet 3   No facility-administered medications prior to visit.      Per HPI  unless specifically indicated in ROS section below Review of Systems Objective:    There were no vitals taken for this visit.  Wt Readings from Last 3 Encounters:  09/09/17 146 lb (66.2 kg)  04/14/17 146 lb (66.2 kg)  09/08/16 145 lb (65.8 kg)    Physical Exam    Results for orders placed or performed in visit on 04/08/17  Lipid panel  Result Value Ref Range   Cholesterol 206 (H) 0 - 200 mg/dL   Triglycerides 73.0 0.0 - 149.0 mg/dL   HDL 47.10 >39.00 mg/dL   VLDL 14.6 0.0 - 40.0 mg/dL   LDL Cholesterol 145 (H) 0 - 99 mg/dL   Total CHOL/HDL Ratio 4    NonHDL 937.16   Basic metabolic panel  Result Value Ref Range   Sodium 138 135 - 145 mEq/L   Potassium 4.8 3.5 - 5.1 mEq/L   Chloride 105 96 - 112 mEq/L   CO2 28 19 - 32 mEq/L   Glucose, Bld 121 (H) 70 - 99 mg/dL   BUN 21 6 - 23 mg/dL   Creatinine, Ser 1.03 0.40 - 1.20 mg/dL   Calcium 10.3 8.4 - 10.5 mg/dL   GFR 70.26 >60.00 mL/min  VITAMIN D 25 Hydroxy (Vit-D Deficiency, Fractures)  Result Value Ref Range   VITD 29.75 (L) 30.00 - 100.00 ng/mL   Assessment & Plan:   Problem List Items Addressed This  Visit    None       No orders of the defined types were placed in this encounter.  No orders of the defined types were placed in this encounter.   Follow up plan: No follow-ups on file.  Ria Bush, MD

## 2018-08-25 NOTE — Progress Notes (Signed)
Patient seen.  See paper records during down time for [facility].

## 2018-08-25 NOTE — Addendum Note (Signed)
Addended by: Ria Bush on: 08/25/2018 11:31 AM   Modules accepted: Orders

## 2018-09-12 ENCOUNTER — Other Ambulatory Visit: Payer: 59

## 2018-09-16 ENCOUNTER — Encounter: Payer: 59 | Admitting: Family Medicine

## 2018-09-19 ENCOUNTER — Ambulatory Visit: Payer: 59 | Admitting: Obstetrics & Gynecology

## 2018-09-22 ENCOUNTER — Other Ambulatory Visit: Payer: Self-pay | Admitting: Family Medicine

## 2018-09-22 MED FILL — SPIRONOLACTONE 25 MG TABS: 25 | 90 days supply | Qty: 90 | Fill #0

## 2018-09-26 ENCOUNTER — Ambulatory Visit
Admission: RE | Admit: 2018-09-26 | Discharge: 2018-09-26 | Disposition: A | Discharge status: Home / Self Care | Payer: 59 | Source: Ambulatory Visit | Attending: Family Medicine | Admitting: Family Medicine

## 2018-09-26 ENCOUNTER — Other Ambulatory Visit: Payer: Self-pay

## 2018-09-26 DIAGNOSIS — N6310 Unspecified lump in the right breast, unspecified quadrant: Secondary | ICD-10-CM | POA: Diagnosis not present

## 2018-09-26 DIAGNOSIS — N63 Unspecified lump in unspecified breast: Secondary | ICD-10-CM

## 2018-09-30 ENCOUNTER — Other Ambulatory Visit: Payer: 59

## 2018-10-04 ENCOUNTER — Encounter: Payer: 59 | Admitting: Family Medicine

## 2018-10-25 ENCOUNTER — Encounter: Payer: Self-pay | Admitting: Gastroenterology

## 2018-11-04 ENCOUNTER — Other Ambulatory Visit: Payer: Self-pay | Admitting: *Deleted

## 2018-11-04 DIAGNOSIS — E8940 Asymptomatic postprocedural ovarian failure: Secondary | ICD-10-CM

## 2018-11-04 DIAGNOSIS — Z79890 Hormone replacement therapy (postmenopausal): Secondary | ICD-10-CM

## 2018-11-04 MED ORDER — ESTRADIOL 2 MG PO TABS: 2.0000 mg | ORAL_TABLET | Freq: Every day | ORAL | 5 refills | Status: DC

## 2018-11-05 MED FILL — ESTRADIOL 2 MG TABLET: 2 | 90 days supply | Qty: 90 | Fill #0

## 2018-11-08 ENCOUNTER — Other Ambulatory Visit: Payer: Self-pay | Admitting: Family Medicine

## 2018-11-08 DIAGNOSIS — Z0000 Encounter for general adult medical examination without abnormal findings: Secondary | ICD-10-CM

## 2018-11-08 MED FILL — BISOPROLOL FUMARATE 5 MG TA: 5 | 90 days supply | Qty: 90 | Fill #0

## 2018-12-21 ENCOUNTER — Telehealth: Payer: Self-pay

## 2018-12-21 NOTE — Telephone Encounter (Signed)
Left detailed VM w COVID screen and back door lab info   

## 2018-12-22 ENCOUNTER — Other Ambulatory Visit: Payer: Self-pay | Admitting: Family Medicine

## 2018-12-22 DIAGNOSIS — E7800 Pure hypercholesterolemia, unspecified: Secondary | ICD-10-CM

## 2018-12-22 DIAGNOSIS — E559 Vitamin D deficiency, unspecified: Secondary | ICD-10-CM

## 2018-12-23 ENCOUNTER — Other Ambulatory Visit: Payer: Self-pay

## 2018-12-23 ENCOUNTER — Other Ambulatory Visit (INDEPENDENT_AMBULATORY_CARE_PROVIDER_SITE_OTHER): Payer: 59

## 2018-12-23 DIAGNOSIS — E559 Vitamin D deficiency, unspecified: Secondary | ICD-10-CM | POA: Diagnosis not present

## 2018-12-23 DIAGNOSIS — E7800 Pure hypercholesterolemia, unspecified: Secondary | ICD-10-CM | POA: Diagnosis not present

## 2018-12-23 LAB — COMPREHENSIVE METABOLIC PANEL
ALT: 11 U/L (ref 0–35)
AST: 14 U/L (ref 0–37)
Albumin: 4.1 g/dL (ref 3.5–5.2)
Alkaline Phosphatase: 47 U/L (ref 39–117)
BUN: 13 mg/dL (ref 6–23)
CO2: 26 mEq/L (ref 19–32)
Calcium: 9.3 mg/dL (ref 8.4–10.5)
Chloride: 107 mEq/L (ref 96–112)
Creatinine, Ser: 0.92 mg/dL (ref 0.40–1.20)
GFR: 74.88 mL/min (ref >60.00)
Glucose, Bld: 85 mg/dL (ref 70–99)
Potassium: 3.9 mEq/L (ref 3.5–5.1)
Sodium: 139 mEq/L (ref 135–145)
Total Bilirubin: 0.3 mg/dL (ref 0.2–1.2)
Total Protein: 6.9 g/dL (ref 6.0–8.3)

## 2018-12-23 LAB — LIPID PANEL
Cholesterol: 148 mg/dL (ref 0–200)
HDL: 46.4 mg/dL (ref >39.00)
LDL Cholesterol: 92 mg/dL (ref 0–99)
NonHDL: 102.05
Total CHOL/HDL Ratio: 3
Triglycerides: 48 mg/dL (ref 0.0–149.0)
VLDL: 9.6 mg/dL (ref 0.0–40.0)

## 2018-12-23 LAB — VITAMIN D 25 HYDROXY (VIT D DEFICIENCY, FRACTURES): VITD: 22.86 ng/mL — ABNORMAL LOW (ref 30.00–100.00)

## 2018-12-30 ENCOUNTER — Ambulatory Visit (INDEPENDENT_AMBULATORY_CARE_PROVIDER_SITE_OTHER): Payer: 59 | Admitting: Family Medicine

## 2018-12-30 ENCOUNTER — Other Ambulatory Visit: Payer: Self-pay

## 2018-12-30 ENCOUNTER — Encounter: Payer: Self-pay | Admitting: Family Medicine

## 2018-12-30 VITALS — BP 122/80 | HR 60 | Temp 97.9°F | Ht 62.0 in | Wt 144.4 lb

## 2018-12-30 DIAGNOSIS — Z0000 Encounter for general adult medical examination without abnormal findings: Secondary | ICD-10-CM | POA: Diagnosis not present

## 2018-12-30 DIAGNOSIS — I10 Essential (primary) hypertension: Secondary | ICD-10-CM | POA: Diagnosis not present

## 2018-12-30 DIAGNOSIS — E559 Vitamin D deficiency, unspecified: Secondary | ICD-10-CM

## 2018-12-30 DIAGNOSIS — E7800 Pure hypercholesterolemia, unspecified: Secondary | ICD-10-CM

## 2018-12-30 MED ORDER — SPIRONOLACTONE 25 MG PO TABS
25.0000 mg | ORAL_TABLET | Freq: Every day | ORAL | 3 refills | Status: DC
Start: 2018-12-30 — End: 2020-01-04

## 2018-12-30 MED ORDER — VITAMIN D3 1.25 MG (50000 UT) PO TABS: 1.0000 | ORAL_TABLET | ORAL | 1 refills | Status: DC

## 2018-12-30 MED ORDER — ATORVASTATIN CALCIUM 20 MG PO TABS: 20.0000 mg | ORAL_TABLET | Freq: Every morning | ORAL | 3 refills | Status: DC

## 2018-12-30 MED ORDER — FUROSEMIDE 20 MG PO TABS: 20.0000 mg | ORAL_TABLET | Freq: Every day | ORAL | 1 refills | Status: DC | PRN

## 2018-12-30 MED ORDER — POTASSIUM CHLORIDE CRYS ER 20 MEQ PO TBCR: 20.0000 meq | EXTENDED_RELEASE_TABLET | Freq: Every day | ORAL | 1 refills | Status: DC | PRN

## 2018-12-30 MED ORDER — BISOPROLOL FUMARATE 5 MG PO TABS: 5.0000 mg | ORAL_TABLET | Freq: Every day | ORAL | 3 refills | Status: DC

## 2018-12-30 MED ORDER — AMLODIPINE BESY-BENAZEPRIL HCL 10-20 MG PO CAPS: 1.0000 | ORAL_CAPSULE | Freq: Every day | ORAL | 3 refills | Status: DC

## 2018-12-30 MED FILL — POTASSIUM CHLORIDE CRYS ER: 20 | 30 days supply | Qty: 30 | Fill #0

## 2018-12-30 MED FILL — VIT D2 1.25 MG (50,000 UNIT: 1.25 MG | 84 days supply | Qty: 12 | Fill #0

## 2018-12-30 MED FILL — ATORVASTATIN 20 MG TABLET: 20 | 90 days supply | Qty: 90 | Fill #0

## 2018-12-30 MED FILL — FUROSEMIDE 20 MG TABS: 20 | 30 days supply | Qty: 30 | Fill #0

## 2018-12-30 MED FILL — SPIRONOLACTONE 25 MG TABLET: 25 | 90 days supply | Qty: 90 | Fill #0

## 2018-12-30 MED FILL — AMLODIPINE-BENAZEPRIL 10-20: 10-20 | 90 days supply | Qty: 90 | Fill #0

## 2018-12-30 NOTE — Assessment & Plan Note (Addendum)
Preventative protocols reviewed and updated unless pt declined. Discussed healthy diet and lifestyle.  

## 2018-12-30 NOTE — Assessment & Plan Note (Signed)
Chronic, stable. Continue current 4 drug regimen.  

## 2018-12-30 NOTE — Patient Instructions (Addendum)
You are due for colonoscopy and well woman exam.  Consider shingles shot - check with insurance on cost (shingrix) 2 shot shingles series.  Vit D was low - start weekly replacement for 6 months then start 1000 units daily over the counter.  Return as needed or in 1 year for next physical.   Health Maintenance, Female Adopting a healthy lifestyle and getting preventive care are important in promoting health and wellness. Ask your health care provider about:  The right schedule for you to have regular tests and exams.  Things you can do on your own to prevent diseases and keep yourself healthy. What should I know about diet, weight, and exercise? Eat a healthy diet   Eat a diet that includes plenty of vegetables, fruits, low-fat dairy products, and lean protein.  Do not eat a lot of foods that are high in solid fats, added sugars, or sodium. Maintain a healthy weight Body mass index (BMI) is used to identify weight problems. It estimates body fat based on height and weight. Your health care provider can help determine your BMI and help you achieve or maintain a healthy weight. Get regular exercise Get regular exercise. This is one of the most important things you can do for your health. Most adults should:  Exercise for at least 150 minutes each week. The exercise should increase your heart rate and make you sweat (moderate-intensity exercise).  Do strengthening exercises at least twice a week. This is in addition to the moderate-intensity exercise.  Spend less time sitting. Even light physical activity can be beneficial. Watch cholesterol and blood lipids Have your blood tested for lipids and cholesterol at 62 years of age, then have this test every 5 years. Have your cholesterol levels checked more often if:  Your lipid or cholesterol levels are high.  You are older than 61 years of age.  You are at high risk for heart disease. What should I know about cancer  screening? Depending on your health history and family history, you may need to have cancer screening at various ages. This may include screening for:  Breast cancer.  Cervical cancer.  Colorectal cancer.  Skin cancer.  Lung cancer. What should I know about heart disease, diabetes, and high blood pressure? Blood pressure and heart disease  High blood pressure causes heart disease and increases the risk of stroke. This is more likely to develop in people who have high blood pressure readings, are of African descent, or are overweight.  Have your blood pressure checked: ? Every 3-5 years if you are 20-55 years of age. ? Every year if you are 52 years old or older. Diabetes Have regular diabetes screenings. This checks your fasting blood sugar level. Have the screening done:  Once every three years after age 82 if you are at a normal weight and have a low risk for diabetes.  More often and at a younger age if you are overweight or have a high risk for diabetes. What should I know about preventing infection? Hepatitis B If you have a higher risk for hepatitis B, you should be screened for this virus. Talk with your health care provider to find out if you are at risk for hepatitis B infection. Hepatitis C Testing is recommended for:  Everyone born from [birth year] through 1964.  Anyone with known risk factors for hepatitis C. Sexually transmitted infections (STIs)  Get screened for STIs, including gonorrhea and chlamydia, if: ? You are sexually active and are younger than  62 years of age. ? You are older than 62 years of age and your health care provider tells you that you are at risk for this type of infection. ? Your sexual activity has changed since you were last screened, and you are at increased risk for chlamydia or gonorrhea. Ask your health care provider if you are at risk.  Ask your health care provider about whether you are at high risk for HIV. Your health care provider may  recommend a prescription medicine to help prevent HIV infection. If you choose to take medicine to prevent HIV, you should first get tested for HIV. You should then be tested every 3 months for as long as you are taking the medicine. Pregnancy  If you are about to stop having your period (premenopausal) and you may become pregnant, seek counseling before you get pregnant.  Take 400 to 800 micrograms (mcg) of folic acid every day if you become pregnant.  Ask for birth control (contraception) if you want to prevent pregnancy. Osteoporosis and menopause Osteoporosis is a disease in which the bones lose minerals and strength with aging. This can result in bone fractures. If you are 87 years old or older, or if you are at risk for osteoporosis and fractures, ask your health care provider if you should:  Be screened for bone loss.  Take a calcium or vitamin D supplement to lower your risk of fractures.  Be given hormone replacement therapy (HRT) to treat symptoms of menopause. Follow these instructions at home: Lifestyle  Do not use any products that contain nicotine or tobacco, such as cigarettes, e-cigarettes, and chewing tobacco. If you need help quitting, ask your health care provider.  Do not use street drugs.  Do not share needles.  Ask your health care provider for help if you need support or information about quitting drugs. Alcohol use  Do not drink alcohol if: ? Your health care provider tells you not to drink. ? You are pregnant, may be pregnant, or are planning to become pregnant.  If you drink alcohol: ? Limit how much you use to 0-1 drink a day. ? Limit intake if you are breastfeeding.  Be aware of how much alcohol is in your drink. In the [place] one drink equals one 12 oz bottle of beer (355 mL), one 5 oz glass of wine (148 mL), or one 1 oz glass of hard liquor (44 mL). General instructions  Schedule regular health, dental, and eye exams.  Stay current with your  vaccines.  Tell your health care provider if: ? You often feel depressed. ? You have ever been abused or do not feel safe at home. Summary  Adopting a healthy lifestyle and getting preventive care are important in promoting health and wellness.  Follow your health care provider's instructions about healthy diet, exercising, and getting tested or screened for diseases.  Follow your health care provider's instructions on monitoring your cholesterol and blood pressure. This information is not intended to replace advice given to you by your health care provider. Make sure you discuss any questions you have with your health care provider. Document Released: 12/15/2010 Document Revised: 05/25/2018 Document Reviewed: 05/25/2018 Elsevier Patient Education  2020 [employer].

## 2018-12-30 NOTE — Assessment & Plan Note (Signed)
Chronic, improved on lipitor - continue. The 10-year ASCVD risk score Mikey Bussing DC Brooke Bonito., et al., 2013) is: 5.3%   Values used to calculate the score:     Age: 63 years     Sex: Female     Is Non-Hispanic African American: Yes     Diabetic: No     Tobacco smoker: No     Systolic Blood Pressure: 810 mmHg     Is BP treated: Yes     HDL Cholesterol: 46.4 mg/dL     Total Cholesterol: 148 mg/dL

## 2018-12-30 NOTE — Assessment & Plan Note (Signed)
Has not been taking vit D replacement. Will start 50k units weekly x 6 months then transition to daily 1000 IU .

## 2018-12-30 NOTE — Progress Notes (Signed)
This visit was conducted in person.  BP 122/80 (BP Location: Left Arm, Patient Position: Sitting, Cuff Size: Normal)   Pulse 60   Temp 97.9 F (36.6 C) (Temporal)   Ht 5\' 2"  (1.575 m)   Wt 144 lb 6 oz (65.5 kg)   SpO2 97%   BMI 26.41 kg/m    CC: CPE Subjective:    Patient ID: Jacqueline Waters, female    DOB: [birth year], 63 y.o.   MRN: [id number]  HPI: Jacqueline Waters is a 63 y.o. female presenting on 12/30/2018 for Annual Exam   Mother and MIL recently passed away.  Retired 03/14/18.  BP well controlled at home.  Tolerating statin well.  Using Nutriboost by [place] for weight loss.   Preventative: COLONOSCOPY Date: 2010 WNL Olevia Perches)  Well woman with OBGYN Dr. Hulan Fray last 08/2016.  Mammo - 09/2018 Birads2 benign stable R breast mass - rec yearly screening mammo Flu shot at work  Tdap 2014  shingrix - discussed.  Seat belt use discussed.  Sunscreen use discussed. No changing moles on skin.  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 Interim medical history since our last visit reviewed. Allergies and medications reviewed and updated. Outpatient Medications Prior to Visit  Medication Sig Dispense Refill  . BIOTIN PO Take 1 tablet by mouth daily.    . calcium carbonate (OS-CAL) 600 MG TABS tablet Take 600 mg by mouth daily with breakfast.    . cholecalciferol (VITAMIN D) 1000 units tablet Take 1,000 Units by mouth daily.    [name] estradiol (ESTRACE) 2 MG tablet Take 1 tablet (2 mg total) by mouth daily. 90 tablet 5  . fluticasone (FLONASE) 50 MCG/ACT nasal spray Place 2 sprays into both nostrils daily. 16 g 1  . Multiple Vitamins-Minerals (CENTRUM PO) Take 1 tablet by mouth daily.    [name]  amLODipine-benazepril (LOTREL) 10-20 MG capsule TAKE 1 CAPSULE BY MOUTH DAILY. 90 capsule 0  . atorvastatin (LIPITOR) 20 MG tablet TAKE 1 TABLET BY MOUTH EVERY MORNING. 90 tablet 0  . bisoprolol (ZEBETA) 5 MG tablet TAKE 1 TABLET BY MOUTH DAILY. 90 tablet 0  . furosemide (LASIX) 20 MG tablet TAKE 1 TABLET BY MOUTH ONCE DAILY AS NEEDED FOR LEG SWELLING    . potassium chloride SA (KLOR-CON M20) 20 MEQ tablet TAKE 1 TABLET BY MOUTH  DAILY AS NEEDED WHEN TAKE LASIX.    [name] spironolactone (ALDACTONE) 25 MG tablet TAKE 1 TABLET BY MOUTH DAILY. 90 tablet 1   No facility-administered medications prior to visit.      Per HPI unless specifically indicated in ROS section below Review of Systems  Constitutional: Negative for activity change, appetite change, chills, fatigue, fever and unexpected weight change.  HENT: Negative for hearing loss.   Eyes: Negative for visual disturbance.  Respiratory: Negative for cough, chest tightness, shortness of breath and wheezing.        Catches her breath  Cardiovascular: Negative for chest pain, palpitations and leg swelling.  Gastrointestinal: Negative for abdominal distention, abdominal pain, blood in stool, constipation, diarrhea, nausea and vomiting.  Genitourinary: Negative for difficulty urinating and hematuria.  Musculoskeletal: Negative for arthralgias, myalgias and neck pain.  Skin: Negative for rash.  Neurological: Negative for dizziness, seizures, syncope and headaches.  Hematological: Negative for adenopathy. Does not bruise/bleed easily.  Psychiatric/Behavioral: Negative for dysphoric mood. The patient is not nervous/anxious.        Grieving    Objective:    BP 122/80 (BP Location: Left Arm, Patient Position: Sitting, Cuff Size: Normal)   Pulse 60   Temp 97.9 F (36.6 C) (Temporal)   Ht 5\' 2"  (1.575 m)   Wt 144 lb 6 oz (65.5 kg)   SpO2 97%   BMI 26.41 kg/m   Wt Readings from Last 3 Encounters:  12/30/18 144 lb 6 oz (65.5 kg)  09/09/17 146  lb (66.2 kg)  04/14/17 146 lb (66.2 kg)    Physical Exam Vitals signs and nursing note reviewed.  Constitutional:      General: She is not in acute distress.    Appearance: Normal appearance. She is well-developed. She is not ill-appearing.  HENT:     Head: Normocephalic and atraumatic.     Right Ear: Hearing, tympanic membrane, ear canal and external ear normal.     Left Ear: Hearing, tympanic membrane, ear canal and external ear normal.     Nose: Nose normal.     Mouth/Throat:     Mouth: Mucous membranes are moist.     Pharynx: Uvula midline. No oropharyngeal exudate or posterior oropharyngeal erythema.  Eyes:     General: No scleral icterus.    Extraocular Movements: Extraocular movements intact.     Conjunctiva/sclera: Conjunctivae normal.     Pupils: Pupils are equal, round, and reactive to light.  Neck:     Musculoskeletal: Normal range of motion and neck supple.  Cardiovascular:     Rate and Rhythm: Normal rate and regular rhythm.     Pulses: Normal pulses.          Radial pulses are 2+ on the right side and 2+ on the left side.     Heart sounds: Normal heart sounds. No murmur.  Pulmonary:     Effort: Pulmonary effort is normal. No respiratory distress.     Breath sounds: Normal breath sounds. No wheezing, rhonchi or rales.  Abdominal:     General: Abdomen is flat. Bowel sounds are normal. There is no distension.     Palpations: Abdomen is soft. There is no mass.     Tenderness: There is no abdominal tenderness. There is no guarding or rebound.     Hernia: No hernia is present.  Musculoskeletal: Normal range of motion.     Right lower leg: No edema.     Left lower leg: No edema.  Lymphadenopathy:     Cervical: No cervical adenopathy.  Skin:    General: Skin is warm and dry.     Findings: No rash.  Neurological:     General: No focal deficit present.     Mental Status: She is alert and oriented to person, place, and time.     Comments: CN grossly intact, station  and gait intact  Psychiatric:        Mood and Affect: Mood normal.        Behavior: Behavior normal.        Thought Content: Thought content normal.        Judgment: Judgment normal.       Results for orders placed or performed in visit on 12/23/18  VITAMIN D 25 Hydroxy (Vit-D Deficiency, Fractures)  Result Value Ref Range   VITD 22.86 (L) 30.00 - 100.00 ng/mL  Comprehensive metabolic panel  Result Value Ref Range   Sodium 139 135 - 145 mEq/L   Potassium 3.9 3.5 - 5.1 mEq/L   Chloride 107 96 - 112 mEq/L   CO2 26 19 - 32 mEq/L   Glucose, Bld 85 70 - 99 mg/dL   BUN 13 6 - 23 mg/dL   Creatinine, Ser 0.92 0.40 - 1.20 mg/dL   Total Bilirubin 0.3 0.2 - 1.2 mg/dL   Alkaline Phosphatase 47 39 - 117 U/L   AST 14 0 - 37 U/L   ALT 11 0 - 35 U/L   Total Protein 6.9 6.0 - 8.3 g/dL   Albumin 4.1 3.5 - 5.2 g/dL   Calcium 9.3 8.4 - 10.5 mg/dL   GFR 74.88 >60.00 mL/min  Lipid panel  Result Value Ref Range   Cholesterol 148 0 - 200 mg/dL   Triglycerides 48.0 0.0 - 149.0 mg/dL   HDL 46.40 >39.00 mg/dL   VLDL 9.6 0.0 - 40.0 mg/dL   LDL Cholesterol 92 0 - 99 mg/dL   Total CHOL/HDL Ratio 3    NonHDL 102.05    Assessment & Plan:   Problem List Items Addressed This Visit    Vitamin D deficiency    Has not been taking vit D replacement. Will start 50k units weekly x 6 months then transition to daily 1000 IU .      HYPERCHOLESTEROLEMIA    Chronic, improved on lipitor - continue. The 10-year ASCVD risk score Mikey Bussing DC Brooke Bonito., et al., 2013) is: 5.3%   Values used to calculate the score:     Age: 58 years     Sex: Female     Is Non-Hispanic African American: Yes     Diabetic: No     Tobacco smoker: No     Systolic Blood Pressure: 093 mmHg     Is BP treated: Yes     HDL Cholesterol: 46.4 mg/dL     Total Cholesterol: 148 mg/dL       Relevant Medications   furosemide (LASIX) 20 MG tablet   amLODipine-benazepril (LOTREL) 10-20 MG capsule   atorvastatin (LIPITOR) 20 MG tablet   bisoprolol  (ZEBETA) 5 MG tablet   spironolactone (ALDACTONE) 25 MG tablet   Health maintenance examination - Primary    Preventative protocols reviewed and updated unless pt declined. Discussed healthy diet and lifestyle.       Relevant Medications   bisoprolol (ZEBETA) 5 MG tablet   Essential hypertension    Chronic, stable. Continue current 4 drug regimen.       Relevant Medications   furosemide (LASIX) 20 MG tablet   amLODipine-benazepril (LOTREL) 10-20 MG capsule   atorvastatin (LIPITOR) 20 MG tablet   bisoprolol (ZEBETA) 5 MG tablet   spironolactone (ALDACTONE) 25 MG tablet       Meds ordered this encounter  Medications  . furosemide (LASIX) 20 MG tablet    Sig: Take 1 tablet (20 mg total) by mouth daily as needed.    Dispense:  30 tablet    Refill:  1  . potassium chloride SA (KLOR-CON M20) 20 MEQ tablet    Sig: Take 1 tablet (20 mEq total) by mouth daily as needed (with lasix).    Dispense:  30 tablet    Refill:  1  . amLODipine-benazepril (LOTREL) 10-20 MG capsule    Sig: Take 1 capsule by mouth daily.    Dispense:  90 capsule    Refill:  3  . atorvastatin (LIPITOR) 20 MG  tablet    Sig: Take 1 tablet (20 mg total) by mouth every morning.    Dispense:  90 tablet    Refill:  3  . bisoprolol (ZEBETA) 5 MG tablet    Sig: Take 1 tablet (5 mg total) by mouth daily.    Dispense:  90 tablet    Refill:  3  . spironolactone (ALDACTONE) 25 MG tablet    Sig: Take 1 tablet (25 mg total) by mouth daily.    Dispense:  90 tablet    Refill:  3  . Cholecalciferol (VITAMIN D3) 1.25 MG (50000 UT) TABS    Sig: Take 1 tablet by mouth once a week.    Dispense:  12 tablet    Refill:  1   No orders of the defined types were placed in this encounter.   Follow up plan: Return in about 1 year (around 12/30/2019) for annual exam, prior fasting for blood work.  Ria Bush, MD

## 2019-02-21 ENCOUNTER — Encounter: Payer: Self-pay | Admitting: Gastroenterology

## 2019-03-14 ENCOUNTER — Other Ambulatory Visit: Payer: Self-pay

## 2019-03-14 ENCOUNTER — Ambulatory Visit (AMBULATORY_SURGERY_CENTER): Payer: Self-pay | Admitting: *Deleted

## 2019-03-14 VITALS — Temp 97.1°F | Ht 62.0 in | Wt 146.0 lb

## 2019-03-14 DIAGNOSIS — Z1211 Encounter for screening for malignant neoplasm of colon: Secondary | ICD-10-CM

## 2019-03-14 MED ORDER — NA SULFATE-K SULFATE-MG SULF 17.5-3.13-1.6 GM/177ML PO SOLN: 1.0000 | Freq: Once | ORAL | 0 refills | Status: AC

## 2019-03-14 NOTE — Progress Notes (Signed)

## 2019-03-16 HISTORY — PX: COLONOSCOPY: SHX174

## 2019-03-23 ENCOUNTER — Encounter: Payer: Self-pay | Admitting: Gastroenterology

## 2019-03-23 ENCOUNTER — Encounter: Payer: Self-pay | Admitting: Family Medicine

## 2019-03-25 MED FILL — BISOPROLOL FUMARATE 5 MG TA: 5 | 90 days supply | Qty: 90 | Fill #0

## 2019-03-28 ENCOUNTER — Encounter: Payer: 59 | Admitting: Gastroenterology

## 2019-03-29 ENCOUNTER — Telehealth: Payer: Self-pay | Admitting: Family Medicine

## 2019-03-29 MED FILL — FLUARIX QUADRIVALENT 0.5 ML: 0.5 | 1 days supply | Qty: 1 | Fill #0

## 2019-03-29 NOTE — Telephone Encounter (Signed)
Pt called wanting to make sure you have [facility] has [facility]

## 2019-03-29 NOTE — Telephone Encounter (Signed)
Left message on vm per dpr informing pt we do have [facility] in addition to [place] and [facility].  Asked pt to call back to clarify was she adding [facility] pharmacy to the list or is that the only pharmacy she wants on her pharmacy list.

## 2019-03-30 NOTE — Telephone Encounter (Signed)
Left message on vm per dpr informing pt we do have [facility] in addition to [place] and [facility].  Asked pt to call back to clarify was she adding [facility] pharmacy to the list or is that the only pharmacy she wants on her pharmacy list.

## 2019-03-31 NOTE — Telephone Encounter (Addendum)
Left message on vm per dpr informing pt we do have [place] in addition to [place] and [facility].  Asked pt to call back to clarify was she adding [facility] pharmacy to the list or is that the only pharmacy she wants on her pharmacy list.   Also, sent message via [place].

## 2019-04-03 ENCOUNTER — Telehealth: Payer: Self-pay

## 2019-04-03 NOTE — Telephone Encounter (Signed)
Covid-19 screening questions   Do you now or have you had a fever in the last 14 days?  Do you have any respiratory symptoms of shortness of breath or cough now or in the last 14 days?  Do you have any family members or close contacts with diagnosed or suspected Covid-19 in the past 14 days?  Have you been tested for Covid-19 and found to be positive?       

## 2019-04-04 ENCOUNTER — Encounter: Payer: Self-pay | Admitting: Gastroenterology

## 2019-04-04 ENCOUNTER — Other Ambulatory Visit: Payer: Self-pay

## 2019-04-04 ENCOUNTER — Ambulatory Visit (AMBULATORY_SURGERY_CENTER): Payer: 59 | Admitting: Gastroenterology

## 2019-04-04 VITALS — BP 116/70 | HR 48 | Temp 97.9°F | Resp 20 | Ht 62.0 in | Wt 146.0 lb

## 2019-04-04 DIAGNOSIS — D123 Benign neoplasm of transverse colon: Secondary | ICD-10-CM | POA: Diagnosis not present

## 2019-04-04 DIAGNOSIS — D127 Benign neoplasm of rectosigmoid junction: Secondary | ICD-10-CM

## 2019-04-04 DIAGNOSIS — D125 Benign neoplasm of sigmoid colon: Secondary | ICD-10-CM | POA: Diagnosis not present

## 2019-04-04 DIAGNOSIS — Z1211 Encounter for screening for malignant neoplasm of colon: Secondary | ICD-10-CM | POA: Diagnosis not present

## 2019-04-04 MED ORDER — SODIUM CHLORIDE 0.9 % IV SOLN: 500.0000 mL | Freq: Once | INTRAVENOUS | Status: DC

## 2019-04-04 NOTE — Progress Notes (Signed)
No problems noted in the recovery room. maw 

## 2019-04-04 NOTE — Progress Notes (Signed)
A/ox3, pleased with MAC, report to RN 

## 2019-04-04 NOTE — Op Note (Signed)
[facility] Patient Name: Jacqueline Waters Procedure Date: 04/04/2019 9:17 AM MRN: [id number] Endoscopist: Justice Britain , MD Age: 63 Referring MD:  Date of Birth: 1956-06-09 Gender: Female Account #: [id number] Procedure:                Colonoscopy Indications:              Screening for malignant neoplasm in the colon Medicines:                Monitored Anesthesia Care Procedure:                Pre-Anesthesia Assessment:                           - Prior to the procedure, a History and Physical                            was performed, and patient medications and                            allergies were reviewed. The patient's tolerance of                            previous anesthesia was also reviewed. The risks                            and benefits of the procedure and the sedation                            options and risks were discussed with the patient.                            All questions were answered, and informed consent                            was obtained. Prior Anticoagulants: The patient has                            taken no previous anticoagulant or antiplatelet                            agents except for NSAID medication. ASA Grade                            Assessment: II - A patient with mild systemic                            disease. After reviewing the risks and benefits,                            the patient was deemed in satisfactory condition to                            undergo the procedure.  After obtaining informed consent, the colonoscope                            was passed under direct vision. Throughout the                            procedure, the patient's blood pressure, pulse, and                            oxygen saturations were monitored continuously. The                            Colonoscope was introduced through the anus and                            advanced to the 5 cm into the  ileum. The                            colonoscopy was performed without difficulty. The                            patient tolerated the procedure. The quality of the                            bowel preparation was good. The terminal ileum,                            ileocecal valve, appendiceal orifice, and rectum                            were photographed. Scope In: 9:26:47 AM Scope Out: 9:42:52 AM Scope Withdrawal Time: 0 hours 13 minutes 9 seconds  Total Procedure Duration: 0 hours 16 minutes 5 seconds  Findings:                 The digital rectal exam findings include                            hemorrhoids. Pertinent negatives include no                            palpable rectal lesions.                           The terminal ileum and ileocecal valve appeared                            normal.                           Four sessile polyps were found in the recto-sigmoid                            colon (2), sigmoid colon (1) and transverse colon                            (  1). The polyps were 2 to 4 mm in size. These                            polyps were removed with a cold snare. Resection                            and retrieval were complete.                           A few small-mouthed diverticula were found in the                            ascending colon.                           Normal mucosa was found in the entire colon                            otherwise.                           Non-bleeding non-thrombosed internal hemorrhoids                            were found during retroflexion, during perianal                            exam and during digital exam. The hemorrhoids were                            Grade II (internal hemorrhoids that prolapse but                            reduce spontaneously). Complications:            No immediate complications. Estimated Blood Loss:     Estimated blood loss was minimal. Impression:               - Hemorrhoids found  on digital rectal exam.                           - The examined portion of the ileum was normal.                           - Four 2 to 4 mm polyps at the recto-sigmoid colon,                            in the sigmoid colon and in the transverse colon,                            removed with a cold snare. Resected and retrieved.                           - Diverticulosis in the ascending colon.                           -  Normal mucosa in the entire examined colon                            otherwise.                           - Non-bleeding non-thrombosed internal hemorrhoids. Recommendation:           - The patient will be observed post-procedure,                            until all discharge criteria are met.                           - Discharge patient to home.                           - Patient has a contact number available for                            emergencies. The signs and symptoms of potential                            delayed complications were discussed with the                            patient. Return to normal activities tomorrow.                            Written discharge instructions were provided to the                            patient.                           - High fiber diet.                           - Use FiberCon 1 tablet PO daily.                           - Continue present medications.                           - Await pathology results.                           - Repeat colonoscopy in 3/10/19/08 years for                            surveillance based on pathology results and                            findings of adenomatous tissue.                           - The findings and recommendations were discussed  with the patient. Justice Britain, MD 04/04/2019 9:48:53 AM

## 2019-04-04 NOTE — Progress Notes (Signed)
Pt's states no medical or surgical changes since previsit or office visit.  Vital signs-[place]  TEMPS-LC

## 2019-04-04 NOTE — Patient Instructions (Addendum)
YOU HAD AN ENDOSCOPIC PROCEDURE TODAY AT [facility]:   Refer to the procedure report that was given to you for any specific questions about what was found during the examination.  If the procedure report does not answer your questions, please call your gastroenterologist to clarify.  If you requested that your care partner not be given the details of your procedure findings, then the procedure report has been included in a sealed envelope for you to review at your convenience later.  YOU SHOULD EXPECT: Some feelings of bloating in the abdomen. Passage of more gas than usual.  Walking can help get rid of the air that was put into your GI tract during the procedure and reduce the bloating. If you had a lower endoscopy (such as a colonoscopy or flexible sigmoidoscopy) you may notice spotting of blood in your stool or on the toilet paper. If you underwent a bowel prep for your procedure, you may not have a normal bowel movement for a few days.  Please Note:  You might notice some irritation and congestion in your nose or some drainage.  This is from the oxygen used during your procedure.  There is no need for concern and it should clear up in a day or so.  SYMPTOMS TO REPORT IMMEDIATELY:   Following lower endoscopy (colonoscopy or flexible sigmoidoscopy):  Excessive amounts of blood in the stool  Significant tenderness or worsening of abdominal pains  Swelling of the abdomen that is new, acute  Fever of 100F or higher   For urgent or emergent issues, a gastroenterologist can be reached at any hour by calling [phone].   DIET:  We do recommend a small meal at first, but then you may proceed to your regular diet.  Drink plenty of fluids but you should avoid alcoholic beverages for 24 hours.  ACTIVITY:  You should plan to take it easy for the rest of today and you should NOT DRIVE or use heavy machinery until tomorrow (because of the sedation medicines used during the test).     FOLLOW UP: Our staff will call the number listed on your records 48-72 hours following your procedure to check on you and address any questions or concerns that you may have regarding the information given to you following your procedure. If we do not reach you, we will leave a message.  We will attempt to reach you two times.  During this call, we will ask if you have developed any symptoms of COVID 19. If you develop any symptoms (ie: fever, flu-like symptoms, shortness of breath, cough etc.) before then, please call [phone].  If you test positive for Covid 19 in the 2 weeks post procedure, please call and report this information to [place].    If any biopsies were taken you will be contacted by phone or by letter within the next 1-3 weeks.  Please call us at [phone] if you have not heard about the biopsies in 3 weeks.    SIGNATURES/CONFIDENTIALITY: You and/or your care partner have signed paperwork which will be entered into your electronic medical record.  These signatures attest to the fact that that the information above on your After Visit Summary has been reviewed and is understood.  Full responsibility of the confidentiality of this discharge information lies with you and/or your care-partner.    Handouts were given to your care partner on polyps, diverticulosis, hemorrhoids,  and a high fiber diet with liberal fluid intake. Use OTC FiberCon  1 tablet by mouth daily. You may resume your current medications today. Await biopsy results. Please call if any questions or concerns.

## 2019-04-06 ENCOUNTER — Telehealth: Payer: Self-pay

## 2019-04-06 ENCOUNTER — Telehealth: Payer: Self-pay | Admitting: *Deleted

## 2019-04-06 ENCOUNTER — Encounter: Payer: Self-pay | Admitting: Gastroenterology

## 2019-04-06 NOTE — Telephone Encounter (Signed)
Follow up call made. No answer, left message. 

## 2019-04-06 NOTE — Telephone Encounter (Signed)
  Follow up Call-  Call back number 04/04/2019  Post procedure Call Back phone  # [phone]  Permission to leave phone message Yes  Some recent data might be hidden     Patient questions:  Do you have a fever, pain , or abdominal swelling? No. Pain Score  0 *  Have you tolerated food without any problems? Yes.    Have you been able to return to your normal activities? Yes.    Do you have any questions about your discharge instructions: Diet   No. Medications  No. Follow up visit  No.  Do you have questions or concerns about your Care? No.  Actions: * If pain score is 4 or above: No action needed, pain <4. 1. Have you developed a fever since your procedure? no  2.   Have you had an respiratory symptoms (SOB or cough) since your procedure? no  3.   Have you tested positive for COVID 19 since your procedure no  4.   Have you had any family members/close contacts diagnosed with the COVID 19 since your procedure?  no   If yes to any of these questions please route to Joylene John, RN and Alphonsa Gin, Therapist, sports.

## 2019-04-10 MED FILL — VIT D3-50 50,000 UNITS CAPS: 1.25 MG | 84 days supply | Qty: 12 | Fill #0

## 2019-04-17 DIAGNOSIS — M2241 Chondromalacia patellae, right knee: Secondary | ICD-10-CM | POA: Diagnosis not present

## 2019-04-24 ENCOUNTER — Encounter: Payer: Self-pay | Admitting: Family Medicine

## 2019-04-24 DIAGNOSIS — M2241 Chondromalacia patellae, right knee: Secondary | ICD-10-CM | POA: Insufficient documentation

## 2019-05-06 ENCOUNTER — Other Ambulatory Visit: Payer: Self-pay

## 2019-05-06 DIAGNOSIS — Z20822 Contact with and (suspected) exposure to covid-19: Secondary | ICD-10-CM

## 2019-05-08 LAB — NOVEL CORONAVIRUS, NAA: SARS-CoV-2, NAA: NOT DETECTED

## 2019-05-22 MED FILL — AMLODIPINE-BENAZEPRIL 10-20: 10-20 | 90 days supply | Qty: 90 | Fill #1

## 2019-05-22 MED FILL — ATORVASTATIN 20 MG TABLET: 20 | 90 days supply | Qty: 90 | Fill #1

## 2019-05-22 MED FILL — ESTRADIOL 2 MG TABLET: 2 | 90 days supply | Qty: 90 | Fill #0

## 2019-05-24 ENCOUNTER — Ambulatory Visit (INDEPENDENT_AMBULATORY_CARE_PROVIDER_SITE_OTHER): Payer: 59 | Admitting: Orthopaedic Surgery

## 2019-05-24 ENCOUNTER — Encounter: Payer: Self-pay | Admitting: Orthopaedic Surgery

## 2019-05-24 ENCOUNTER — Ambulatory Visit (INDEPENDENT_AMBULATORY_CARE_PROVIDER_SITE_OTHER): Payer: 59

## 2019-05-24 ENCOUNTER — Other Ambulatory Visit: Payer: Self-pay

## 2019-05-24 DIAGNOSIS — R2231 Localized swelling, mass and lump, right upper limb: Secondary | ICD-10-CM | POA: Diagnosis not present

## 2019-05-24 DIAGNOSIS — M65331 Trigger finger, right middle finger: Secondary | ICD-10-CM

## 2019-05-24 MED ORDER — DICLOFENAC SODIUM 1 % EX GEL: 2.0000 g | Freq: Four times a day (QID) | CUTANEOUS | 2 refills | Status: DC

## 2019-05-24 MED FILL — DICLOFENAC SODIUM 1 % GEL: 1 | 12 days supply | Qty: 100 | Fill #0

## 2019-05-24 NOTE — Progress Notes (Signed)
Office Visit Note   Patient: Jacqueline Waters           Date of Birth: [birth year]           MRN: [id number] Visit Date: 05/24/2019              Requested by: Ria Bush, MD [address] [place],  [place] [address] PCP: Ria Bush, MD   Assessment & Plan: Visit Diagnoses:  1. Localized swelling on right hand   2. Trigger finger, right middle finger     Plan: Offered her an injection for the right long finger triggering and she defers.  Therefore we will have her apply Voltaren gel up to 2 g 4 times daily to the right long finger over the A1 pulley region.  Should follow-up on an as-needed basis.  Questions encouraged and answered by Dr. Ninfa Linden and myself.  Follow-Up Instructions: Return if symptoms worsen or fail to improve.   Orders:  Orders Placed This Encounter  Procedures  . XR Hand Complete Right   Meds ordered this encounter  Medications  . diclofenac Sodium (VOLTAREN) 1 % GEL    Sig: Apply 2 g topically 4 (four) times daily.    Dispense:  100 g    Refill:  2      Procedures: No procedures performed   Clinical Data: No additional findings.   Subjective: Chief Complaint  Patient presents with  . Right Hand - Pain  . Right Middle Finger - Pain    HPI Jacqueline Waters is a 63 year old female who comes in today for right hand pain.  She states is having ongoing for the past 3 months no known injury.  States that her right long finger locks up occasionally she is able to actively bring it back out to full extension.  She is nondiabetic.  She is left-hand dominant.  She has tried no treatment for this. Review of Systems Negative for fevers or chills.  Please see HPI otherwise negative  Objective: Vital Signs: There were no vitals taken for this visit.  Physical Exam General: Well-developed well-nourished female no acute distress. Psych: Alert and oriented x3 Ortho Exam Right hand vascular radial pulses intact.  Full sensation throughout  the hand to light touch.  Full motor of the right hand.  She has palpable nodule at the A1 pulley is tender.  No active triggering.  Slight swelling over the dorsal aspect of the right MCP joint.  No skin breakdown, rashes or ecchymosis. Specialty Comments:  No specialty comments available.  Imaging: Xr Hand Complete Right  Result Date: 05/24/2019 Right hand 3 views: No acute findings.  No acute fractures no bony abnormalities joints are all well preserved.    PMFS History: Patient Active Problem List   Diagnosis Date Noted  . 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 Health maintenance examination 12/05/2014  . Pedal edema 12/31/2011  . Vitamin D deficiency 07/16/2009  . HEADACHE, MIXED 07/03/2007  . HYPERCHOLESTEROLEMIA 06/28/2007  . Essential hypertension 06/28/2007  . DEGENERATIVE DISC DISEASE 06/28/2007  . BACK PAIN, LUMBAR 06/28/2007   Past Medical History:  Diagnosis Date  . Breast cyst   . Essential hypertension   . HLD (hyperlipidemia)   . Vaginal delivery    x3  . VITAMIN D DEFICIENCY 07/16/2009   Qualifier: Diagnosis of  By: Royal Piedra NP, Tammy      Family History  Problem Relation Age of Onset  . Hypertension Mother   . CAD Mother  39       s/p CABG  . Stroke Mother   . Colon polyps Mother   . Hypertension Father   . CAD Father 64       deceased from MI  . Stroke Father        left paralysis  . Diabetes Sister   . Cancer Sister 40       breast  . Breast cancer Sister   . Esophageal cancer Neg Hx   . Rectal cancer Neg Hx   . Stomach cancer Neg Hx   . Colon cancer Neg Hx     Past Surgical History:  Procedure Laterality Date  . ABDOMINAL HYSTERECTOMY     for uterine cyst, ovaries remain (Dr. Zigmund Daniel)  . BREAST EXCISIONAL BIOPSY Left 05/05/1993  . COLONOSCOPY  2010   WNL ([facility])  . CYSTECTOMY Left    Social History   Occupational History  . Not on file  Tobacco Use  . Smoking status: Never Smoker  . Smokeless tobacco: Never Used   Substance and Sexual Activity  . Alcohol use: No    Alcohol/week: 0.0 standard drinks  . Drug use: No  . Sexual activity: Yes    Partners: Male    Birth control/protection: Surgical

## 2019-06-19 MED FILL — spIRONOLACTONE 25 MG TABS: 25 | 90 days supply | Qty: 90 | Fill #1

## 2019-06-27 ENCOUNTER — Ambulatory Visit (INDEPENDENT_AMBULATORY_CARE_PROVIDER_SITE_OTHER): Payer: 59 | Admitting: Family Medicine

## 2019-06-27 ENCOUNTER — Telehealth: Payer: Self-pay

## 2019-06-27 ENCOUNTER — Encounter: Payer: Self-pay | Admitting: Family Medicine

## 2019-06-27 ENCOUNTER — Other Ambulatory Visit: Payer: Self-pay

## 2019-06-27 VITALS — BP 134/70 | HR 60 | Temp 97.6°F | Ht 62.0 in | Wt 145.1 lb

## 2019-06-27 DIAGNOSIS — R0602 Shortness of breath: Secondary | ICD-10-CM

## 2019-06-27 DIAGNOSIS — I10 Essential (primary) hypertension: Secondary | ICD-10-CM | POA: Diagnosis not present

## 2019-06-27 DIAGNOSIS — R0600 Dyspnea, unspecified: Secondary | ICD-10-CM | POA: Insufficient documentation

## 2019-06-27 NOTE — Progress Notes (Signed)
This visit was conducted in person.  BP 134/70 (BP Location: Right Arm, Cuff Size: Normal)   Pulse 60   Temp 97.6 F (36.4 C) (Temporal)   Ht 5\' 2"  (1.575 m)   Wt 145 lb 2 oz (65.8 kg)   SpO2 97%   BMI 26.54 kg/m   Orthostatic VS for the past 24 hrs (Last 3 readings):  BP- Lying BP- Standing at 0 minutes  06/27/19 1127 -- 120/76  06/27/19 1126 122/78 --    BP Readings from Last 3 Encounters:  06/27/19 134/70  04/04/19 116/70  12/30/18 122/80   CC: dizziness, dyspnea Subjective:    Patient ID: Jacqueline Waters, female    DOB: [birth year], 64 y.o.   MRN: [id number]  HPI: LATISH UMBAUGH is a 64 y.o. female presenting on 06/27/2019 for Dizziness (C/o lightheadedness.  Started 1 wk ago. ) and Shortness of Breath (C/o SOB on exertion and occasional cough.  Also, started about 1 wk ago. )   1 wk h/o dizziness associated with dyspnea. Dizziness described as orthostatic lightheadedness. No vertigo, no syncope/presyncope. No significant cough, no fevers/chills, ST, congestion. No chest pain or tightness.   Symptoms started while watching the news on Wednesday - current events at the [place]. Symptoms have'nt been as bad since.   Home BP readings 110s/40s.  She is on lotrel 10/20mg  daily, bisoprolol 5mg  daily, and aldactone 25mg  daily and lasix 20mg  daily PRN. Not needing lasix.  No sick contacts at home.       Relevant past medical, surgical, family and social history reviewed and updated as indicated. Interim medical history since our last visit reviewed. Allergies and medications reviewed and updated. Outpatient Medications Prior to Visit  Medication Sig Dispense Refill  . acetaminophen (TYLENOL) 500 MG tablet Take 500 mg by mouth daily as needed.    [name] amLODipine-benazepril (LOTREL) 10-20 MG capsule Take 1 capsule by mouth daily. 90 capsule 3  . atorvastatin (LIPITOR) 20 MG tablet Take 1 tablet (20 mg total) by mouth every morning. 90 tablet 3  . BIOTIN PO Take 1 tablet by  mouth daily.    . bisoprolol (ZEBETA) 5 MG tablet Take 1 tablet (5 mg total) by mouth daily. 90 tablet 3  . calcium carbonate (OS-CAL) 600 MG TABS tablet Take 600 mg by mouth daily with breakfast.    . Cholecalciferol (VITAMIN D3) 1.25 MG (50000 UT) TABS Take 1 tablet by mouth once a week. 12 tablet 1  . diclofenac Sodium (VOLTAREN) 1 % GEL Apply 2 g topically 4 (four) times daily. 100 g 2  . estradiol (ESTRACE) 2 MG tablet Take 1 tablet (2 mg total) by mouth daily. 90 tablet 5  . fluticasone (FLONASE) 50 MCG/ACT nasal spray Place 2 sprays into both nostrils daily. 16 g 1  . furosemide (LASIX) 20 MG tablet Take 1 tablet (20 mg total) by mouth daily as needed. 30 tablet 1  . ibuprofen (ADVIL) 200 MG tablet Take 200 mg by mouth every 6 (six) hours as needed.    . Multiple Vitamins-Minerals (CENTRUM PO) Take 1 tablet by mouth daily.    [name] OVER THE COUNTER MEDICATION [place] dietary supplement 1 T daily po    . potassium chloride SA (KLOR-CON M20) 20 MEQ tablet Take 1 tablet (20 mEq total) by mouth daily as needed (with lasix). 30 tablet 1  . spironolactone (ALDACTONE) 25 MG tablet Take 1 tablet (25 mg total) by mouth daily. 90 tablet 3   No facility-administered medications  prior to visit.     Per HPI unless specifically indicated in ROS section below Review of Systems Objective:    BP 134/70 (BP Location: Right Arm, Cuff Size: Normal)   Pulse 60   Temp 97.6 F (36.4 C) (Temporal)   Ht 5\' 2"  (1.575 m)   Wt 145 lb 2 oz (65.8 kg)   SpO2 97%   BMI 26.54 kg/m   Wt Readings from Last 3 Encounters:  06/27/19 145 lb 2 oz (65.8 kg)  04/04/19 146 lb (66.2 kg)  03/14/19 146 lb (66.2 kg)    Physical Exam Vitals and nursing note reviewed.  Constitutional:      General: She is not in acute distress.    Appearance: Normal appearance. She is not ill-appearing.  Eyes:     Extraocular Movements: Extraocular movements intact.     Pupils: Pupils are equal, round, and reactive to light.  Neck:      Thyroid: No thyromegaly or thyroid tenderness.  Cardiovascular:     Rate and Rhythm: Normal rate and regular rhythm.     Pulses: Normal pulses.     Heart sounds: Normal heart sounds. No murmur.  Pulmonary:     Effort: Pulmonary effort is normal. No respiratory distress.     Breath sounds: Normal breath sounds. No wheezing, rhonchi or rales.  Musculoskeletal:     Right lower leg: No edema.     Left lower leg: No edema.  Skin:    General: Skin is warm and dry.     Findings: No rash.  Neurological:     Mental Status: She is alert.  Psychiatric:        Mood and Affect: Mood normal.        Behavior: Behavior normal.       Results for orders placed or performed in visit on 05/06/19  Novel Coronavirus, NAA ([facility])   Specimen: Nasopharyngeal(NP) swabs in vial transport medium   NASOPHARYNGE  SCREENIN  Result Value Ref Range   SARS-CoV-2, NAA Not Detected Not Detected   Assessment & Plan:  This visit occurred during the SARS-CoV-2 public health emergency.  Safety protocols were in place, including screening questions prior to the visit, additional usage of staff PPE, and extensive cleaning of exam room while observing appropriate contact time as indicated for disinfecting solutions.   Problem List Items Addressed This Visit    Essential hypertension    Stable period on current regimen. Continue.       Dyspnea - Primary    Recent shortness of breath associated with dizziness in setting of watching stressful current events on TV - that improved after she stopped watching the news. Anticipate stress contributing to current symptoms. Overall benign exam and vitals today. Supportive care reviewed including importance of healthy stress relieving strategies. Update if not improving with this or if recurrent/worsening symptoms.           No orders of the defined types were placed in this encounter.  No orders of the defined types were placed in this encounter.    Patient  Instructions  Blood pressure is looking ok today - no medicine changes today. Continue healthy stress relieving strategies such as walking.  Let me know if ongoing or worsening symptoms.    Follow up plan: Return if symptoms worsen or fail to improve.  Ria Bush, MD

## 2019-06-27 NOTE — Assessment & Plan Note (Signed)
Recent shortness of breath associated with dizziness in setting of watching stressful current events on TV - that improved after she stopped watching the news. Anticipate stress contributing to current symptoms. Overall benign exam and vitals today. Supportive care reviewed including importance of healthy stress relieving strategies. Update if not improving with this or if recurrent/worsening symptoms.

## 2019-06-27 NOTE — Assessment & Plan Note (Signed)
Stable period on current regimen. Continue.  

## 2019-06-27 NOTE — Telephone Encounter (Signed)
Seen today. 

## 2019-06-27 NOTE — Telephone Encounter (Signed)
SOB upon exertion and when lying down and lightheaded for 1 wk on and off; no room spinning. Pt does not feel good. Raspy voice but not S/T.no fever or chills,no H/A, no weakness, no CP, no cough,no rash, no abd pain or diarrhea,no loss of taste or smell,no respiratory issues, no vomiting.no body aches. Pt did have runny nose 2 days ago. No travel and no known exposure to + covid. Dr Darnell Level said OK to schedule in office visit; pt scheduled today at 11:15 and pt will be in office at 11 AM. UC & ED precautions given and pt voiced understanding.

## 2019-06-27 NOTE — Patient Instructions (Signed)
Blood pressure is looking ok today - no medicine changes today. Continue healthy stress relieving strategies such as walking.  Let me know if ongoing or worsening symptoms.

## 2019-07-03 ENCOUNTER — Other Ambulatory Visit: Payer: Self-pay | Admitting: Family Medicine

## 2019-07-03 MED FILL — VIT D3-50 50,000 UNITS CAPS: 1.25 MG | 84 days supply | Qty: 12 | Fill #0

## 2019-07-05 ENCOUNTER — Ambulatory Visit: Payer: 59 | Admitting: Family Medicine

## 2019-07-05 ENCOUNTER — Encounter: Payer: Self-pay | Admitting: Family Medicine

## 2019-07-05 ENCOUNTER — Telehealth: Payer: Self-pay

## 2019-07-05 ENCOUNTER — Other Ambulatory Visit: Payer: Self-pay

## 2019-07-05 VITALS — BP 118/66 | HR 55 | Temp 97.7°F | Ht 62.0 in | Wt 145.8 lb

## 2019-07-05 DIAGNOSIS — N3001 Acute cystitis with hematuria: Secondary | ICD-10-CM

## 2019-07-05 DIAGNOSIS — R300 Dysuria: Secondary | ICD-10-CM | POA: Diagnosis not present

## 2019-07-05 LAB — POCT URINALYSIS DIPSTICK
Bilirubin, UA: NEGATIVE
Glucose, UA: NEGATIVE
Ketones, UA: NEGATIVE
Nitrite, UA: NEGATIVE
Protein, UA: POSITIVE — AB
Spec Grav, UA: <=1.005 — AB (ref 1.010–1.025)
Urobilinogen, UA: 0.2 E.U./dL
pH, UA: 6 (ref 5.0–8.0)

## 2019-07-05 MED ORDER — NITROFURANTOIN MONOHYD MACRO 100 MG PO CAPS: 100.0000 mg | ORAL_CAPSULE | Freq: Two times a day (BID) | ORAL | 0 refills | Status: AC

## 2019-07-05 MED FILL — NITROFURANTOIN MONO-MCR 100: 100 | 5 days supply | Qty: 10 | Fill #0

## 2019-07-05 NOTE — Telephone Encounter (Signed)
See note from today

## 2019-07-05 NOTE — Telephone Encounter (Signed)
Pt already has appt 07/05/19 at 9:40 with Dr Einar Pheasant.

## 2019-07-05 NOTE — Telephone Encounter (Signed)
[facility] - Client TELEPHONE ADVICE RECORD [facility] Patient Name: Jacqueline Waters Gender: Female DOB: 03-08-1956 Age: 64 Y 38 M 19 D Return Phone Number: [phone] (Primary), [phone] (Secondary) Address[place] [address] Client [facility] - Client Client Site [facility] Physician Ria Bush - MD Contact Type Call Who Is Calling Patient / Member / Family / Caregiver Call Type Triage / Clinical Relationship To Patient Self Return Phone Number [phone] (Secondary) Chief Complaint Urination Pain Reason for Call Symptomatic / Request for [place] woke up this am with dysuria, mild hematuria. Translation No Nurse Assessment Nurse: Mendel Ryder, RN, Denman George Date/Time (Eastern Time): 07/05/2019 6:06:32 AM Confirm and document reason for call. If symptomatic, describe symptoms. ---Caller woke up this am with dysuria and mild hematuria. Has the patient had close contact with a person known or suspected to have the novel coronavirus illness OR traveled / lives in area with major community spread (including international travel) in the last 14 days from the onset of symptoms? * If Asymptomatic, screen for exposure and travel within the last 14 days. ---No Does the patient have any new or worsening symptoms? ---Yes Will a triage be completed? ---Yes Related visit to physician within the last 2 weeks? ---No Does the PT have any chronic conditions? (i.e. diabetes, asthma, this includes High risk factors for pregnancy, etc.) ---Yes List chronic conditions. ---hypertension and low vit D Is this a behavioral health or substance abuse call? ---No Guidelines Guideline Title Affirmed Question Affirmed Notes Nurse Date/Time (Eastern Time) Urination Pain - Female Blood in urine (red, pink, or tea-colored) Mendel Ryder, RN, Denman George 07/05/2019  6:08:04 AM Disp. Time Eilene Ghazi Time) Disposition Final User 07/05/2019 6:12:59 AM See PCP within 24 Hours Yes Mendel Ryder, RN, Denman George PLEASE NOTE: All timestamps contained within this report are represented as [place] Standard Time. CONFIDENTIALTY NOTICE: This fax transmission is intended only for the addressee. It contains information that is legally privileged, confidential or otherwise protected from use or disclosure. If you are not the intended recipient, you are strictly prohibited from reviewing, disclosing, copying using or disseminating any of this information or taking any action in reliance on or regarding this information. If you have received this fax in error, please notify us immediately by telephone so that we can arrange for its return to [place]. Phone: [phone], Toll-Free: [phone], Fax: [phone] Page: 2 of 2 Call Id: [id number] [place] Disagree/Comply Comply Caller Understands Yes PreDisposition Home Care Care Advice Given Per Guideline SEE PCP WITHIN 24 HOURS: REASSURANCE AND EDUCATION: This could be an urinary tract infection. You should see your PCP to be examined and tested. FLUIDS: Drink extra fluids. Drink 8-10 glasses of liquids a day (Reason: to produce a dilute, non-irritating urine). CAUTION - FLUIDS: * Increased fluid intake may be contraindicated in adults with renal failure or heart failure. CALL BACK IF: * Fever or back pain occurs * You become worse. CARE ADVICE given per Urination Pain - Female (Adult) guideline. Referrals REFERRED TO PCP OFFICE

## 2019-07-05 NOTE — Patient Instructions (Addendum)
Urinary Tract Infection - take the antibiotic - drink lots of water - can try Cranberry Juice or Azo (over the counter) to help with pain - if symptoms not improving call back - if pink color does not improving after treatment return

## 2019-07-05 NOTE — Progress Notes (Signed)
   Subjective:     Jacqueline Waters is a 64 y.o. female presenting for Dysuria (pinkish stain on toilet paper, pain with urination. symptoms started at 5 am today.)     HPI  #Dysuria - started this morning - pain with urination - with every time - frequency - saw some pink on the toilet paper -    Review of Systems  Gastrointestinal: Negative for nausea and vomiting.  Genitourinary: Positive for dysuria, frequency and urgency. Negative for difficulty urinating and vaginal discharge.  Musculoskeletal: Negative for back pain.     Social History   Tobacco Use  Smoking Status Never Smoker  Smokeless Tobacco Never Used        Objective:    BP Readings from Last 3 Encounters:  07/05/19 118/66  06/27/19 134/70  04/04/19 116/70   Wt Readings from Last 3 Encounters:  07/05/19 145 lb 12 oz (66.1 kg)  06/27/19 145 lb 2 oz (65.8 kg)  04/04/19 146 lb (66.2 kg)    BP 118/66   Pulse (!) 55   Temp 97.7 F (36.5 C)   Ht 5\' 2"  (1.575 m)   Wt 145 lb 12 oz (66.1 kg)   SpO2 99%   BMI 26.66 kg/m    Physical Exam Constitutional:      General: She is not in acute distress.    Appearance: She is well-developed. She is not diaphoretic.  HENT:     Head: Normocephalic and atraumatic.  Eyes:     Conjunctiva/sclera: Conjunctivae normal.  Cardiovascular:     Rate and Rhythm: Normal rate and regular rhythm.     Heart sounds: Normal heart sounds.  Pulmonary:     Effort: Pulmonary effort is normal.  Abdominal:     General: Bowel sounds are normal. There is no distension.     Palpations: Abdomen is soft.     Tenderness: There is no abdominal tenderness. There is no guarding.  Musculoskeletal:     Cervical back: Neck supple.  Skin:    General: Skin is warm and dry.  Neurological:     Mental Status: She is alert.      UA: +blood, +LE, negative nitrites     Assessment & Plan:   Problem List Items Addressed This Visit    None    Visit Diagnoses    Dysuria    -   Primary   Relevant Medications   nitrofurantoin, macrocrystal-monohydrate, (MACROBID) 100 MG capsule   Other Relevant Orders   POCT urinalysis dipstick (Completed)   Acute cystitis with hematuria       Relevant Medications   nitrofurantoin, macrocrystal-monohydrate, (MACROBID) 100 MG capsule   Other Relevant Orders   Urine Culture     Suspect UTI. Abx prescribed. F/u UCx   Return if symptoms worsen or fail to improve.  Lesleigh Noe, MD

## 2019-07-06 LAB — URINE CULTURE
MICRO NUMBER:: 10061727
SPECIMEN QUALITY:: ADEQUATE

## 2019-07-07 ENCOUNTER — Encounter: Payer: Self-pay | Admitting: Family Medicine

## 2019-07-19 MED FILL — BISOPROLOL FUMARATE 5 MG TA: 5 | 90 days supply | Qty: 90 | Fill #0

## 2019-07-30 ENCOUNTER — Encounter: Payer: Self-pay | Admitting: Family Medicine

## 2019-07-31 ENCOUNTER — Encounter: Payer: Self-pay | Admitting: Family Medicine

## 2019-07-31 ENCOUNTER — Ambulatory Visit (INDEPENDENT_AMBULATORY_CARE_PROVIDER_SITE_OTHER): Payer: 59 | Admitting: Family Medicine

## 2019-07-31 ENCOUNTER — Other Ambulatory Visit: Payer: Self-pay

## 2019-07-31 VITALS — BP 114/68 | HR 64 | Ht 62.0 in | Wt 145.0 lb

## 2019-07-31 DIAGNOSIS — Z01419 Encounter for gynecological examination (general) (routine) without abnormal findings: Secondary | ICD-10-CM | POA: Diagnosis not present

## 2019-07-31 DIAGNOSIS — E8940 Asymptomatic postprocedural ovarian failure: Secondary | ICD-10-CM | POA: Diagnosis not present

## 2019-07-31 DIAGNOSIS — Z79890 Hormone replacement therapy (postmenopausal): Secondary | ICD-10-CM | POA: Diagnosis not present

## 2019-07-31 MED ORDER — ESTRADIOL 2 MG PO TABS: 2.0000 mg | ORAL_TABLET | Freq: Every day | ORAL | 5 refills | Status: DC

## 2019-07-31 NOTE — Assessment & Plan Note (Signed)
On HRT qod. Works well for her.

## 2019-07-31 NOTE — Progress Notes (Signed)
  Subjective:     Jacqueline Waters is a 64 y.o. female and is here for a comprehensive physical exam. The patient reports no problems. Retired last year. 8 granddaughters, including 2 sets of twins. Wants to volunteer after COVID. [place] for vaccination. On HRT, working well.  The following portions of the patient's history were reviewed and updated as appropriate: allergies, current medications, past family history, past medical history, past social history, past surgical history and problem list.  Review of Systems Pertinent items noted in HPI and remainder of comprehensive ROS otherwise negative.   Objective:    BP 114/68   Pulse 64   Ht 5\' 2"  (1.575 m)   Wt 145 lb (65.8 kg)   BMI 26.52 kg/m  General appearance: alert, cooperative and appears stated age Head: Normocephalic, without obvious abnormality, atraumatic Neck: no adenopathy, supple, symmetrical, trachea midline and thyroid not enlarged, symmetric, no tenderness/mass/nodules Lungs: clear to auscultation bilaterally Breasts: normal appearance, no masses or tenderness Heart: regular rate and rhythm, S1, S2 normal, no murmur, click, rub or gallop Abdomen: soft, non-tender; bowel sounds normal; no masses,  no organomegaly Pelvic: external genitalia normal, no adnexal masses or tenderness, uterus surgically absent and vagina normal without discharge Extremities: extremities normal, atraumatic, no cyanosis or edema Pulses: 2+ and symmetric Skin: Skin color, texture, turgor normal. No rashes or lesions Lymph nodes: Cervical, supraclavicular, and axillary nodes normal. Neurologic: Grossly normal    Assessment:    Healthy female exam.      Plan:   Problem List Items Addressed This Visit      Unprioritized   Surgical menopause on hormone replacement therapy    On HRT qod. Works well for her.      Relevant Medications   estradiol (ESTRACE) 2 MG tablet    Other Visit Diagnoses    Encounter for gynecological examination  without abnormal finding    -  Primary     Return in 1 year (on 07/30/2020).     See After Visit Summary for Counseling Recommendations

## 2019-08-17 ENCOUNTER — Ambulatory Visit: Payer: 59 | Attending: Internal Medicine

## 2019-08-17 DIAGNOSIS — Z23 Encounter for immunization: Secondary | ICD-10-CM | POA: Insufficient documentation

## 2019-08-17 NOTE — Progress Notes (Signed)
   Covid-19 Vaccination Clinic  Name:  Jacqueline Waters    MRN: [id number] DOB: 06-20-1955  08/17/2019  Jacqueline Waters was observed post Covid-19 immunization for 15 minutes without incident. She was provided with Vaccine Information Sheet and instruction to access the V-Safe system.   Jacqueline Waters was instructed to call 911 with any severe reactions post vaccine: [name] Difficulty breathing  . Swelling of face and throat  . A fast heartbeat  . A bad rash all over body  . Dizziness and weakness   Immunizations Administered    Name Date Dose VIS Date Route   Moderna COVID-19 Vaccine 08/17/2019 10:09 AM 0.5 mL 05/16/2019 Intramuscular   Manufacturer: Moderna   Lot: [id number]   [id number]

## 2019-08-22 ENCOUNTER — Encounter: Payer: Self-pay | Admitting: Family Medicine

## 2019-08-22 MED ORDER — FLUTICASONE PROPIONATE 50 MCG/ACT NA SUSP: 2.0000 | Freq: Every day | NASAL | 1 refills | Status: DC

## 2019-08-22 MED FILL — FLUTICASONE PROP 50 MCG SPR: 50 | 30 days supply | Qty: 16 | Fill #0

## 2019-08-22 NOTE — Telephone Encounter (Signed)
E-scribed refill to [facility].  Notified pt via [facility].

## 2019-08-29 ENCOUNTER — Other Ambulatory Visit: Payer: Self-pay | Admitting: Family Medicine

## 2019-08-29 DIAGNOSIS — Z1231 Encounter for screening mammogram for malignant neoplasm of breast: Secondary | ICD-10-CM

## 2019-08-29 MED FILL — ATORVASTATIN 20 MG TABLET: 20 | 90 days supply | Qty: 90 | Fill #2

## 2019-08-29 MED FILL — AMLODIPINE-BENAZEPRIL 10-20: 10-20 | 90 days supply | Qty: 90 | Fill #2

## 2019-09-11 MED FILL — VIT D3-50 50,000 UNITS CAPS: 1.25 MG | 84 days supply | Qty: 12 | Fill #1

## 2019-09-19 ENCOUNTER — Ambulatory Visit: Payer: 59 | Attending: Family

## 2019-09-19 DIAGNOSIS — Z23 Encounter for immunization: Secondary | ICD-10-CM

## 2019-09-19 NOTE — Progress Notes (Signed)
   Covid-19 Vaccination Clinic  Name:  Jacqueline Waters    MRN: [id number] DOB: 01-19-1956  09/19/2019  [name] was observed post Covid-19 immunization for 15 minutes without incident. She was provided with Vaccine Information Sheet and instruction to access the V-Safe system.   [name] was instructed to call 911 with any severe reactions post vaccine: [name] Difficulty breathing  . Swelling of face and throat  . A fast heartbeat  . A bad rash all over body  . Dizziness and weakness   Immunizations Administered    Name Date Dose VIS Date Route   Moderna COVID-19 Vaccine 09/19/2019 10:31 AM 0.5 mL 05/16/2019 Intramuscular   Manufacturer: Moderna   Lot: [id number]   [id number]

## 2019-09-27 ENCOUNTER — Ambulatory Visit: Payer: 59

## 2019-10-04 MED FILL — SPIRONOLACTONE 25 MG TABS: 25 | 90 days supply | Qty: 90 | Fill #2

## 2019-10-26 ENCOUNTER — Ambulatory Visit: Payer: 59

## 2019-11-06 ENCOUNTER — Other Ambulatory Visit: Payer: Self-pay

## 2019-11-06 ENCOUNTER — Ambulatory Visit
Admission: RE | Admit: 2019-11-06 | Discharge: 2019-11-06 | Disposition: A | Discharge status: Home / Self Care | Payer: 59 | Source: Ambulatory Visit | Attending: Family Medicine | Admitting: Family Medicine

## 2019-11-06 DIAGNOSIS — Z1231 Encounter for screening mammogram for malignant neoplasm of breast: Secondary | ICD-10-CM | POA: Diagnosis not present

## 2019-11-06 LAB — HM MAMMOGRAPHY

## 2019-11-07 ENCOUNTER — Encounter: Payer: Self-pay | Admitting: Family Medicine

## 2019-11-09 ENCOUNTER — Encounter: Payer: Self-pay | Admitting: Family Medicine

## 2019-12-08 ENCOUNTER — Other Ambulatory Visit: Payer: Self-pay | Admitting: Family Medicine

## 2019-12-08 MED FILL — ESTRADIOL 2 MG TABS: 2 | 90 days supply | Qty: 90 | Fill #0

## 2019-12-08 MED FILL — ATORVASTATIN 20 MG TABLET: 20 | 90 days supply | Qty: 90 | Fill #3

## 2019-12-08 MED FILL — AMLODIPINE-BENAZEPRIL 10-20: 10-20 | 90 days supply | Qty: 90 | Fill #3

## 2019-12-08 NOTE — Telephone Encounter (Signed)
Per Dr. Darnell Level, pt was to take vit D3 50,000 u wkly for 6 mos, then start daily 1,000 u daily.  [see OV, 12/30/18]  Refused refill.

## 2019-12-13 NOTE — Telephone Encounter (Signed)
Patient called wanting to know why her Vitamin D prescription was denied. Advised patient of Dr. Bosie Clos CMA's note and she verbalized understanding. Patient stated that she will start taking OTC Vitamin D.

## 2019-12-31 ENCOUNTER — Other Ambulatory Visit: Payer: Self-pay | Admitting: Family Medicine

## 2019-12-31 DIAGNOSIS — I10 Essential (primary) hypertension: Secondary | ICD-10-CM

## 2019-12-31 DIAGNOSIS — E559 Vitamin D deficiency, unspecified: Secondary | ICD-10-CM

## 2019-12-31 DIAGNOSIS — E7800 Pure hypercholesterolemia, unspecified: Secondary | ICD-10-CM

## 2020-01-01 ENCOUNTER — Other Ambulatory Visit: Payer: Self-pay

## 2020-01-01 ENCOUNTER — Other Ambulatory Visit (INDEPENDENT_AMBULATORY_CARE_PROVIDER_SITE_OTHER): Payer: 59

## 2020-01-01 DIAGNOSIS — I10 Essential (primary) hypertension: Secondary | ICD-10-CM | POA: Diagnosis not present

## 2020-01-01 DIAGNOSIS — E7800 Pure hypercholesterolemia, unspecified: Secondary | ICD-10-CM

## 2020-01-01 DIAGNOSIS — E559 Vitamin D deficiency, unspecified: Secondary | ICD-10-CM | POA: Diagnosis not present

## 2020-01-01 LAB — COMPREHENSIVE METABOLIC PANEL
ALT: 12 U/L (ref 0–35)
AST: 15 U/L (ref 0–37)
Albumin: 4 g/dL (ref 3.5–5.2)
Alkaline Phosphatase: 46 U/L (ref 39–117)
BUN: 15 mg/dL (ref 6–23)
CO2: 27 mEq/L (ref 19–32)
Calcium: 10.1 mg/dL (ref 8.4–10.5)
Chloride: 106 mEq/L (ref 96–112)
Creatinine, Ser: 1.06 mg/dL (ref 0.40–1.20)
GFR: 63.38 mL/min (ref >60.00)
Glucose, Bld: 94 mg/dL (ref 70–99)
Potassium: 4 mEq/L (ref 3.5–5.1)
Sodium: 138 mEq/L (ref 135–145)
Total Bilirubin: 0.3 mg/dL (ref 0.2–1.2)
Total Protein: 7 g/dL (ref 6.0–8.3)

## 2020-01-01 LAB — LIPID PANEL
Cholesterol: 138 mg/dL (ref 0–200)
HDL: 41.3 mg/dL (ref >39.00)
LDL Cholesterol: 83 mg/dL (ref 0–99)
NonHDL: 96.98
Total CHOL/HDL Ratio: 3
Triglycerides: 70 mg/dL (ref 0.0–149.0)
VLDL: 14 mg/dL (ref 0.0–40.0)

## 2020-01-01 LAB — VITAMIN D 25 HYDROXY (VIT D DEFICIENCY, FRACTURES): VITD: 63.72 ng/mL (ref 30.00–100.00)

## 2020-01-01 LAB — TSH: TSH: 4.04 u[IU]/mL (ref 0.35–4.50)

## 2020-01-04 ENCOUNTER — Encounter: Payer: Self-pay | Admitting: Family Medicine

## 2020-01-04 ENCOUNTER — Other Ambulatory Visit: Payer: Self-pay | Admitting: Family Medicine

## 2020-01-04 ENCOUNTER — Other Ambulatory Visit: Payer: Self-pay

## 2020-01-04 ENCOUNTER — Ambulatory Visit (INDEPENDENT_AMBULATORY_CARE_PROVIDER_SITE_OTHER): Payer: 59 | Admitting: Family Medicine

## 2020-01-04 VITALS — BP 120/72 | HR 57 | Temp 97.9°F | Ht 62.5 in | Wt 144.4 lb

## 2020-01-04 DIAGNOSIS — E7800 Pure hypercholesterolemia, unspecified: Secondary | ICD-10-CM | POA: Diagnosis not present

## 2020-01-04 DIAGNOSIS — Z0000 Encounter for general adult medical examination without abnormal findings: Secondary | ICD-10-CM

## 2020-01-04 DIAGNOSIS — Z23 Encounter for immunization: Secondary | ICD-10-CM | POA: Diagnosis not present

## 2020-01-04 DIAGNOSIS — E559 Vitamin D deficiency, unspecified: Secondary | ICD-10-CM

## 2020-01-04 DIAGNOSIS — I10 Essential (primary) hypertension: Secondary | ICD-10-CM | POA: Diagnosis not present

## 2020-01-04 MED ORDER — FLUTICASONE PROPIONATE 50 MCG/ACT NA SUSP: 2.0000 | Freq: Every day | NASAL | 6 refills | Status: DC

## 2020-01-04 MED ORDER — SPIRONOLACTONE 25 MG PO TABS: 25.0000 mg | ORAL_TABLET | Freq: Every day | ORAL | 3 refills | Status: DC

## 2020-01-04 MED ORDER — AMLODIPINE BESY-BENAZEPRIL HCL 10-20 MG PO CAPS: 1.0000 | ORAL_CAPSULE | Freq: Every day | ORAL | 3 refills | Status: DC

## 2020-01-04 MED ORDER — BISOPROLOL FUMARATE 5 MG PO TABS: 5.0000 mg | ORAL_TABLET | Freq: Every day | ORAL | 3 refills | Status: DC

## 2020-01-04 MED ORDER — ATORVASTATIN CALCIUM 20 MG PO TABS: 20.0000 mg | ORAL_TABLET | Freq: Every morning | ORAL | 3 refills | Status: DC

## 2020-01-04 MED FILL — SPIRONOLACTONE 25 MG TABS: 25 | 90 days supply | Qty: 90 | Fill #0

## 2020-01-04 MED FILL — FLUTICASONE PROP 50 MCG SPR: 50 | 30 days supply | Qty: 16 | Fill #0

## 2020-01-04 NOTE — Assessment & Plan Note (Addendum)
Chronic, stable. Continue current 4 drug regimen. There is fmhx HTN.  Pending aldosterone levels.

## 2020-01-04 NOTE — Assessment & Plan Note (Signed)
Preventative protocols reviewed and updated unless pt declined. Discussed healthy diet and lifestyle.  

## 2020-01-04 NOTE — Patient Instructions (Addendum)
Shingrix today. Schedule nurse visit in 2-6 months to completed the series.  Increase water intake. Continue regular walking routine.  Good to see you today Return as needed or in 1 year for next physical.   Health Maintenance for Postmenopausal Women Menopause is a normal process in which your ability to get pregnant comes to an end. This process happens slowly over many months or years, usually between the ages of 45 and 31. Menopause is complete when you have missed your menstrual periods for 12 months. It is important to talk with your health care provider about some of the most common conditions that affect women after menopause (postmenopausal women). These include heart disease, cancer, and bone loss (osteoporosis). Adopting a healthy lifestyle and getting preventive care can help to promote your health and wellness. The actions you take can also lower your chances of developing some of these common conditions. What should I know about menopause? During menopause, you may get a number of symptoms, such as:  Hot flashes. These can be moderate or severe.  Night sweats.  Decrease in sex drive.  Mood swings.  Headaches.  Tiredness.  Irritability.  Memory problems.  Insomnia. Choosing to treat or not to treat these symptoms is a decision that you make with your health care provider. Do I need hormone replacement therapy?  Hormone replacement therapy is effective in treating symptoms that are caused by menopause, such as hot flashes and night sweats.  Hormone replacement carries certain risks, especially as you become older. If you are thinking about using estrogen or estrogen with progestin, discuss the benefits and risks with your health care provider. What is my risk for heart disease and stroke? The risk of heart disease, heart attack, and stroke increases as you age. One of the causes may be a change in the body's hormones during menopause. This can affect how your body uses  dietary fats, triglycerides, and cholesterol. Heart attack and stroke are medical emergencies. There are many things that you can do to help prevent heart disease and stroke. Watch your blood pressure  High blood pressure causes heart disease and increases the risk of stroke. This is more likely to develop in people who have high blood pressure readings, are of African descent, or are overweight.  Have your blood pressure checked: ? Every 3-5 years if you are 35-65 years of age. ? Every year if you are 54 years old or older. Eat a healthy diet   Eat a diet that includes plenty of vegetables, fruits, low-fat dairy products, and lean protein.  Do not eat a lot of foods that are high in solid fats, added sugars, or sodium. Get regular exercise Get regular exercise. This is one of the most important things you can do for your health. Most adults should:  Try to exercise for at least 150 minutes each week. The exercise should increase your heart rate and make you sweat (moderate-intensity exercise).  Try to do strengthening exercises at least twice each week. Do these in addition to the moderate-intensity exercise.  Spend less time sitting. Even light physical activity can be beneficial. Other tips  Work with your health care provider to achieve or maintain a healthy weight.  Do not use any products that contain nicotine or tobacco, such as cigarettes, e-cigarettes, and chewing tobacco. If you need help quitting, ask your health care provider.  Know your numbers. Ask your health care provider to check your cholesterol and your blood sugar (glucose). Continue to have  your blood tested as directed by your health care provider. Do I need screening for cancer? Depending on your health history and family history, you may need to have cancer screening at different stages of your life. This may include screening for:  Breast cancer.  Cervical cancer.  Lung cancer.  Colorectal cancer. What  is my risk for osteoporosis? After menopause, you may be at increased risk for osteoporosis. Osteoporosis is a condition in which bone destruction happens more quickly than new bone creation. To help prevent osteoporosis or the bone fractures that can happen because of osteoporosis, you may take the following actions:  If you are 41-42 years old, get at least 1,000 mg of calcium and at least 600 mg of vitamin D per day.  If you are older than age 41 but younger than age 85, get at least 1,200 mg of calcium and at least 600 mg of vitamin D per day.  If you are older than age 23, get at least 1,200 mg of calcium and at least 800 mg of vitamin D per day. Smoking and drinking excessive alcohol increase the risk of osteoporosis. Eat foods that are rich in calcium and vitamin D, and do weight-bearing exercises several times each week as directed by your health care provider. How does menopause affect my mental health? Depression may occur at any age, but it is more common as you become older. Common symptoms of depression include:  Low or sad mood.  Changes in sleep patterns.  Changes in appetite or eating patterns.  Feeling an overall lack of motivation or enjoyment of activities that you previously enjoyed.  Frequent crying spells. Talk with your health care provider if you think that you are experiencing depression. General instructions See your health care provider for regular wellness exams and vaccines. This may include:  Scheduling regular health, dental, and eye exams.  Getting and maintaining your vaccines. These include: ? Influenza vaccine. Get this vaccine each year before the flu season begins. ? Pneumonia vaccine. ? Shingles vaccine. ? Tetanus, diphtheria, and pertussis (Tdap) booster vaccine. Your health care provider may also recommend other immunizations. Tell your health care provider if you have ever been abused or do not feel safe at home. Summary  Menopause is a  normal process in which your ability to get pregnant comes to an end.  This condition causes hot flashes, night sweats, decreased interest in sex, mood swings, headaches, or lack of sleep.  Treatment for this condition may include hormone replacement therapy.  Take actions to keep yourself healthy, including exercising regularly, eating a healthy diet, watching your weight, and checking your blood pressure and blood sugar levels.  Get screened for cancer and depression. Make sure that you are up to date with all your vaccines. This information is not intended to replace advice given to you by your health care provider. Make sure you discuss any questions you have with your health care provider. Document Revised: 05/25/2018 Document Reviewed: 05/25/2018 Elsevier Patient Education  2020 [employer].

## 2020-01-04 NOTE — Assessment & Plan Note (Addendum)
Chronic, stable on current 4 drug regimen - continue.

## 2020-01-04 NOTE — Progress Notes (Signed)
This visit was conducted in person.  BP 120/72 (BP Location: Left Arm, Patient Position: Sitting, Cuff Size: Normal)   Pulse (!) 57   Temp 97.9 F (36.6 C) (Temporal)   Ht 5' 2.5" (1.588 m)   Wt 144 lb 6 oz (65.5 kg)   SpO2 98%   BMI 25.99 kg/m   BP Readings from Last 3 Encounters:  01/04/20 120/72  07/31/19 114/68  07/05/19 118/66    CC: CPE Subjective:    Patient ID: Jacqueline Waters, female    DOB: 06/28/1955, 64 y.o.   MRN: [id number]  HPI: Jacqueline Waters is a 64 y.o. female presenting on 01/04/2020 for Annual Exam   Retired 02/2018.  Tolerating statin well.  Doesn't check BP at home.  Using Nutriboost by [place] for weight loss.   Preventative: COLONOSCOPY Date: 2010 WNL Olevia Perches) COLONOSCOPY 03/2019 - TA, HP, diverticulosis rpt 3 yrs ([facility])  Well woman with OBGYN Dr. Hulan Fray 07/2019 recently saw Dr Kennon Rounds - on qod estrace.  Mammo - 10/2019 Birads1  Flu shot at work  COVID vaccine - completed Moderna 09/2019 Tdap 2014  Shingrix - discussed. Will start today.  Seat belt use discussed.  Sunscreen use discussed. No changing moles on skin. 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 Interim medical history since our last visit reviewed. Allergies and medications reviewed and updated. Outpatient Medications Prior to Visit  Medication Sig Dispense Refill  . acetaminophen (TYLENOL) 500 MG tablet Take 500 mg by mouth daily as needed.    . calcium carbonate (OS-CAL) 600 MG TABS tablet Take 600 mg by mouth daily with breakfast.    . Cholecalciferol (VITAMIN D3) 25 MCG (1000 UT) CAPS Take 1 capsule by mouth daily.    . diclofenac Sodium (VOLTAREN) 1 % GEL  Apply 2 g topically 4 (four) times daily. 100 g 2  . estradiol (ESTRACE) 2 MG tablet Take 1 tablet (2 mg total) by mouth daily. 90 tablet 5  . furosemide (LASIX) 20 MG tablet Take 1 tablet (20 mg total) by mouth daily as needed. 30 tablet 1  . ibuprofen (ADVIL) 200 MG tablet Take 200 mg by mouth every 6 (six) hours as needed.    . Multiple Vitamins-Minerals (CENTRUM PO) Take 1 tablet by mouth daily.    . potassium chloride SA (KLOR-CON M20) 20 MEQ tablet Take 1 tablet (20 mEq total) by mouth daily as needed (with lasix). 30 tablet 1  . Probiotic Product (PROBIOTIC DAILY PO) Take by mouth.    [name] amLODipine-benazepril (LOTREL) 10-20 MG capsule Take 1 capsule by mouth daily. 90 capsule 3  . atorvastatin (LIPITOR) 20 MG tablet Take 1 tablet (20 mg total) by mouth every morning. 90 tablet 3  . BIOTIN PO Take 1 tablet by mouth daily.    . bisoprolol (ZEBETA) 5 MG tablet Take 1 tablet (5 mg total) by mouth daily. 90 tablet 3  . fluticasone (FLONASE) 50 MCG/ACT nasal spray Place 2 sprays into both nostrils daily. 16 g 1  . spironolactone (ALDACTONE) 25 MG tablet Take 1 tablet (25 mg total) by mouth daily. 90 tablet 3  . Cholecalciferol (VITAMIN D3) 1.25 MG (50000 UT)  TABS Take 1 tablet by mouth once a week. 12 tablet 1  . OVER THE COUNTER MEDICATION [place] dietary supplement 1 T daily po     No facility-administered medications prior to visit.     Per HPI unless specifically indicated in ROS section below Review of Systems  Constitutional: Negative for activity change, appetite change, chills, fatigue, fever and unexpected weight change.  HENT: Negative for hearing loss.   Eyes: Negative for visual disturbance.  Respiratory: Positive for cough (when she drinks red wine). Negative for chest tightness, shortness of breath and wheezing.   Cardiovascular: Negative for chest pain, palpitations and leg swelling.  Gastrointestinal: Negative for abdominal distention, abdominal pain, blood in stool,  constipation, diarrhea, nausea and vomiting.  Genitourinary: Negative for difficulty urinating and hematuria.  Musculoskeletal: Negative for arthralgias, myalgias and neck pain.  Skin: Negative for rash.  Neurological: Negative for dizziness, seizures, syncope and headaches.  Hematological: Negative for adenopathy. Bruises/bleeds easily (mild).  Psychiatric/Behavioral: Negative for dysphoric mood. The patient is not nervous/anxious.    Objective:  BP 120/72 (BP Location: Left Arm, Patient Position: Sitting, Cuff Size: Normal)   Pulse (!) 57   Temp 97.9 F (36.6 C) (Temporal)   Ht 5' 2.5" (1.588 m)   Wt 144 lb 6 oz (65.5 kg)   SpO2 98%   BMI 25.99 kg/m   Wt Readings from Last 3 Encounters:  01/04/20 144 lb 6 oz (65.5 kg)  07/31/19 145 lb (65.8 kg)  07/05/19 145 lb 12 oz (66.1 kg)      Physical Exam Vitals and nursing note reviewed.  Constitutional:      General: She is not in acute distress.    Appearance: Normal appearance. She is well-developed. She is not ill-appearing.  HENT:     Head: Normocephalic and atraumatic.     Right Ear: Hearing, tympanic membrane, ear canal and external ear normal.     Left Ear: Hearing, tympanic membrane, ear canal and external ear normal.  Eyes:     General: No scleral icterus.    Extraocular Movements: Extraocular movements intact.     Conjunctiva/sclera: Conjunctivae normal.     Pupils: Pupils are equal, round, and reactive to light.  Neck:     Thyroid: No thyroid mass, thyromegaly or thyroid tenderness.  Cardiovascular:     Rate and Rhythm: Normal rate and regular rhythm.     Pulses: Normal pulses.          Radial pulses are 2+ on the right side and 2+ on the left side.     Heart sounds: Normal heart sounds. No murmur heard.   Pulmonary:     Effort: Pulmonary effort is normal. No respiratory distress.     Breath sounds: Normal breath sounds. No wheezing, rhonchi or rales.  Abdominal:     General: Abdomen is flat. Bowel sounds are  normal. There is no distension.     Palpations: Abdomen is soft. There is no mass.     Tenderness: There is no abdominal tenderness. There is no guarding or rebound.     Hernia: No hernia is present.  Musculoskeletal:        General: Normal range of motion.     Cervical back: Normal range of motion and neck supple.     Right lower leg: No edema.     Left lower leg: No edema.  Lymphadenopathy:     Cervical: No cervical adenopathy.  Skin:    General: Skin is warm and dry.  Findings: No rash.  Neurological:     General: No focal deficit present.     Mental Status: She is alert and oriented to person, place, and time.     Comments: CN grossly intact, station and gait intact  Psychiatric:        Mood and Affect: Mood normal.        Behavior: Behavior normal.        Thought Content: Thought content normal.        Judgment: Judgment normal.       Results for orders placed or performed in visit on 01/01/20  TSH  Result Value Ref Range   TSH 4.04 0.35 - 4.50 uIU/mL  VITAMIN D 25 Hydroxy (Vit-D Deficiency, Fractures)  Result Value Ref Range   VITD 63.72 30.00 - 100.00 ng/mL  Comprehensive metabolic panel  Result Value Ref Range   Sodium 138 135 - 145 mEq/L   Potassium 4.0 3.5 - 5.1 mEq/L   Chloride 106 96 - 112 mEq/L   CO2 27 19 - 32 mEq/L   Glucose, Bld 94 70 - 99 mg/dL   BUN 15 6 - 23 mg/dL   Creatinine, Ser 1.06 0.40 - 1.20 mg/dL   Total Bilirubin 0.3 0.2 - 1.2 mg/dL   Alkaline Phosphatase 46 39 - 117 U/L   AST 15 0 - 37 U/L   ALT 12 0 - 35 U/L   Total Protein 7.0 6.0 - 8.3 g/dL   Albumin 4.0 3.5 - 5.2 g/dL   GFR 63.38 >60.00 mL/min   Calcium 10.1 8.4 - 10.5 mg/dL  Lipid panel  Result Value Ref Range   Cholesterol 138 0 - 200 mg/dL   Triglycerides 70.0 0 - 149 mg/dL   HDL 41.30 >39.00 mg/dL   VLDL 14.0 0.0 - 40.0 mg/dL   LDL Cholesterol 83 0 - 99 mg/dL   Total CHOL/HDL Ratio 3    NonHDL 96.98    Assessment & Plan:  This visit occurred during the SARS-CoV-2  public health emergency.  Safety protocols were in place, including screening questions prior to the visit, additional usage of staff PPE, and extensive cleaning of exam room while observing appropriate contact time as indicated for disinfecting solutions.   Problem List Items Addressed This Visit    Vitamin D deficiency    Chronic, stable on current 4 drug regimen - continue.       HYPERCHOLESTEROLEMIA    Chronic, stable on lipitor - continue. The 10-year ASCVD risk score Mikey Bussing DC Brooke Bonito., et al., 2013) is: 5.2%   Values used to calculate the score:     Age: 35 years     Sex: Female     Is Non-Hispanic African American: Yes     Diabetic: No     Tobacco smoker: No     Systolic Blood Pressure: 324 mmHg     Is BP treated: Yes     HDL Cholesterol: 41.3 mg/dL     Total Cholesterol: 138 mg/dL       Relevant Medications   amLODipine-benazepril (LOTREL) 10-20 MG capsule   atorvastatin (LIPITOR) 20 MG tablet   spironolactone (ALDACTONE) 25 MG tablet   bisoprolol (ZEBETA) 5 MG tablet   Health maintenance examination - Primary    Preventative protocols reviewed and updated unless pt declined. Discussed healthy diet and lifestyle.       Relevant Medications   bisoprolol (ZEBETA) 5 MG tablet   Essential hypertension    Chronic, stable. Continue current 4 drug regimen.  There is fmhx HTN.  Pending aldosterone levels.       Relevant Medications   amLODipine-benazepril (LOTREL) 10-20 MG capsule   atorvastatin (LIPITOR) 20 MG tablet   spironolactone (ALDACTONE) 25 MG tablet   bisoprolol (ZEBETA) 5 MG tablet    Other Visit Diagnoses    Need for shingles vaccine       Relevant Orders   Varicella-zoster vaccine IM (Completed)       Meds ordered this encounter  Medications  . amLODipine-benazepril (LOTREL) 10-20 MG capsule    Sig: Take 1 capsule by mouth daily.    Dispense:  90 capsule    Refill:  3  . atorvastatin (LIPITOR) 20 MG tablet    Sig: Take 1 tablet (20 mg total) by mouth  every morning.    Dispense:  90 tablet    Refill:  3  . fluticasone (FLONASE) 50 MCG/ACT nasal spray    Sig: Place 2 sprays into both nostrils daily.    Dispense:  16 g    Refill:  6  . spironolactone (ALDACTONE) 25 MG tablet    Sig: Take 1 tablet (25 mg total) by mouth daily.    Dispense:  90 tablet    Refill:  3  . bisoprolol (ZEBETA) 5 MG tablet    Sig: Take 1 tablet (5 mg total) by mouth daily.    Dispense:  90 tablet    Refill:  3   Orders Placed This Encounter  Procedures  . Varicella-zoster vaccine IM    Patient instructions: Shingrix today. Schedule nurse visit in 2-6 months to completed the series.  Increase water intake. Continue regular walking routine.  Good to see you today Return as needed or in 1 year for next physical.   Follow up plan: Return in about 1 year (around 01/03/2021), or if symptoms worsen or fail to improve, for annual exam, prior fasting for blood work.  Ria Bush, MD

## 2020-01-04 NOTE — Assessment & Plan Note (Signed)
Chronic, stable on lipitor - continue. The 10-year ASCVD risk score Mikey Bussing DC Brooke Bonito., et al., 2013) is: 5.2%   Values used to calculate the score:     Age: 64 years     Sex: Female     Is Non-Hispanic African American: Yes     Diabetic: No     Tobacco smoker: No     Systolic Blood Pressure: 247 mmHg     Is BP treated: Yes     HDL Cholesterol: 41.3 mg/dL     Total Cholesterol: 138 mg/dL

## 2020-01-06 LAB — ALDOSTERONE + RENIN ACTIVITY W/ RATIO
ALDO / PRA Ratio: 5.8 Ratio (ref 0.9–28.9)
Aldosterone: 18 ng/dL
Renin Activity: 3.1 ng/mL/h (ref 0.25–5.82)

## 2020-01-18 LAB — HEMOGLOBIN A1C
Estimated Avg Glucose, External: 124 mg/dL — ABNORMAL HIGH (ref 91–123)
Hemoglobin A1C, External: 6 % — ABNORMAL HIGH (ref 4.8–5.6)

## 2020-02-02 MED FILL — BISOPROLOL FUMARATE 5 MG TA: 5 | 90 days supply | Qty: 90 | Fill #0

## 2020-03-20 MED FILL — ATORVASTATIN 20 MG TABLET: 20 | 90 days supply | Qty: 90 | Fill #0

## 2020-03-20 MED FILL — AMLODIPINE-BENAZEPRIL 10-20: 10-20 | 90 days supply | Qty: 90 | Fill #0

## 2020-03-27 ENCOUNTER — Other Ambulatory Visit: Payer: Self-pay

## 2020-03-27 ENCOUNTER — Ambulatory Visit (INDEPENDENT_AMBULATORY_CARE_PROVIDER_SITE_OTHER): Payer: 59

## 2020-03-27 DIAGNOSIS — Z23 Encounter for immunization: Secondary | ICD-10-CM | POA: Diagnosis not present

## 2020-04-09 ENCOUNTER — Ambulatory Visit: Payer: 59

## 2020-04-11 ENCOUNTER — Telehealth: Payer: Self-pay | Admitting: Family Medicine

## 2020-04-11 NOTE — Telephone Encounter (Signed)
called and left vm for the patient to call us to do pre covid screening ? appt reminder 04/16/20

## 2020-04-16 ENCOUNTER — Ambulatory Visit: Payer: 59

## 2020-04-22 MED FILL — SPIRONOLACTONE 25 MG TABS: 25 | 90 days supply | Qty: 90 | Fill #1

## 2020-04-24 ENCOUNTER — Ambulatory Visit (INDEPENDENT_AMBULATORY_CARE_PROVIDER_SITE_OTHER): Payer: 59

## 2020-04-24 DIAGNOSIS — Z23 Encounter for immunization: Secondary | ICD-10-CM | POA: Diagnosis not present

## 2020-04-25 ENCOUNTER — Other Ambulatory Visit (HOSPITAL_COMMUNITY): Payer: Self-pay | Admitting: Physician Assistant

## 2020-04-25 ENCOUNTER — Ambulatory Visit: Payer: 59 | Admitting: Physician Assistant

## 2020-04-25 ENCOUNTER — Encounter: Payer: Self-pay | Admitting: Physician Assistant

## 2020-04-25 ENCOUNTER — Ambulatory Visit (INDEPENDENT_AMBULATORY_CARE_PROVIDER_SITE_OTHER): Payer: 59

## 2020-04-25 DIAGNOSIS — M545 Low back pain, unspecified: Secondary | ICD-10-CM

## 2020-04-25 MED ORDER — METHYLPREDNISOLONE 4 MG PO TABS
ORAL_TABLET | ORAL | 0 refills | Status: DC
Start: 2020-04-25 — End: 2020-05-29

## 2020-04-25 MED FILL — METHYLPREDNISOLONE 4 MG TBP: 4 | 6 days supply | Qty: 21 | Fill #0

## 2020-04-25 NOTE — Progress Notes (Signed)
Office Visit Note   Patient: Jacqueline Waters           Date of Birth: 01-09-1956           MRN: [id number] Visit Date: 04/25/2020              Requested by: Ria Bush, MD [address] [place],  [place] [address] PCP: Ria Bush, MD   Assessment & Plan: Visit Diagnoses:  1. Low back pain, unspecified back pain laterality, unspecified chronicity, unspecified whether sciatica present     Plan:  We will send her to formal physical therapy for her lumbar spine they will work on core strengthening back exercises hamstring stretching, IT band stretching home exercise program and include modalities.  See her back in a month to see how she is doing with the conservative treatment.  Pain becomes worse in any way or if she develops any bowel bladder dysfunction saddle anesthesia like symptoms she will go to the emergency room.  Questions were encouraged and answered at length.  Follow-Up Instructions: Return in about 4 weeks (around 05/23/2020).   Orders:  Orders Placed This Encounter  Procedures  . XR Lumbar Spine 2-3 Views   Meds ordered this encounter  Medications  . methylPREDNISolone (MEDROL) 4 MG tablet    Sig: Take as directed    Dispense:  21 tablet    Refill:  0      Procedures: No procedures performed   Clinical Data: No additional findings.   Subjective: Chief Complaint  Patient presents with  . Lower Back - Pain    HPI Jacqueline Waters is a 64 year old female who we have seen in the past for her right middle trigger finger.  She comes in today for new complaint of low back pain.  Back pain some been ongoing for the past 4 days.  Only occurs whenever she is bending over.  She has no pain whenever she is lying down sitting standing.  She does state that the pain did awaken her last night.  She said no bowel bladder dysfunction no numbness tingling down either leg.  No saddle anesthesia like symptoms.  She does note that she has pain down both legs to  the proximal tibia ranks pain to be 4 out of 5 pain at worst.  Describes achy pain.  She has had no prior back pain.  Patient is nondiabetic. Review of Systems Denies any fevers chills shortness of breath chest pain.  Please see HPI otherwise negative  Objective: Vital Signs: There were no vitals taken for this visit.  Physical Exam Constitutional:      Appearance: She is not ill-appearing or diaphoretic.  Cardiovascular:     Pulses: Normal pulses.  Pulmonary:     Effort: Pulmonary effort is normal.  Neurological:     Mental Status: She is alert and oriented to person, place, and time.  Psychiatric:        Mood and Affect: Mood normal.     Ortho Exam Lower extremity strength testing 5 out of 5 strength throughout.  Negative straight leg raise bilaterally.  Has tenderness over the trochanteric region of both hips left greater than right.  Good range of motion of bilateral hips without pain.  She is able to flex through her lumbar spine touch toes.  She has limited extension lumbar spine.  Deep tendon reflexes are 2+ at the knees and ankles and equal and symmetric.  Sensation grossly intact bilateral feet light touch. Specialty  Comments:  No specialty comments available.  Imaging: XR Lumbar Spine 2-3 Views  Result Date: 04/25/2020 2 views lumbar spine: No acute fractures.  Normal lordotic curvature.  No spondylolisthesis.  Disc space overall well-maintained.  Lower lumbar facet changes otherwise the lumbar spine is well-maintained.    PMFS History: Patient Active Problem List   Diagnosis Date Noted  . 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 Health maintenance examination 12/05/2014  . Pedal edema 12/31/2011  . Vitamin D deficiency 07/16/2009  . HEADACHE, MIXED 07/03/2007  . HYPERCHOLESTEROLEMIA 06/28/2007  . Essential hypertension 06/28/2007  . DEGENERATIVE DISC DISEASE 06/28/2007  . BACK PAIN,  LUMBAR 06/28/2007   Past Medical History:  Diagnosis Date  . Breast cyst   . Essential hypertension   . HLD (hyperlipidemia)   . Vaginal delivery    x3  . VITAMIN D DEFICIENCY 07/16/2009   Qualifier: Diagnosis of  By: Royal Piedra NP, Tammy      Family History  Problem Relation Age of Onset  . Hypertension Mother   . CAD Mother 56       s/p CABG  . Stroke Mother   . Colon polyps Mother   . Hypertension Father   . CAD Father 29       deceased from MI  . Stroke Father        left paralysis  . Breast cancer Half-Sister 43  . Diabetes Half-Sister   . Esophageal cancer Neg Hx   . Rectal cancer Neg Hx   . Stomach cancer Neg Hx   . Colon cancer Neg Hx     Past Surgical History:  Procedure Laterality Date  . ABDOMINAL HYSTERECTOMY     for uterine cyst, ovaries remain (Dr. Zigmund Daniel)  . COLONOSCOPY  2010   WNL ([facility])  . COLONOSCOPY  03/2019   TA, HP, diverticulosis rpt 3 yrs ([facility])  . CYSTECTOMY Left    Social History   Occupational History  . Not on file  Tobacco Use  . Smoking status: Never Smoker  . Smokeless tobacco: Never Used  Vaping Use  . Vaping Use: Never used  Substance and Sexual Activity  . Alcohol use: No    Alcohol/week: 0.0 standard drinks  . Drug use: No  . Sexual activity: Yes    Partners: Male    Birth control/protection: Surgical

## 2020-04-29 ENCOUNTER — Ambulatory Visit: Payer: 59 | Attending: Internal Medicine

## 2020-04-29 DIAGNOSIS — Z23 Encounter for immunization: Secondary | ICD-10-CM

## 2020-04-29 NOTE — Progress Notes (Signed)
   Covid-19 Vaccination Clinic  Name:  DAHLIA NIFONG    MRN: [id number] DOB: 08-17-1955  04/29/2020  Ms. Hollander was observed post Covid-19 immunization for 15 minutes without incident. She was provided with Vaccine Information Sheet and instruction to access the V-Safe system.   Ms. Altmann was instructed to call 911 with any severe reactions post vaccine: [name] Difficulty breathing  . Swelling of face and throat  . A fast heartbeat  . A bad rash all over body  . Dizziness and weakness   Immunizations Administered    No immunizations on file.

## 2020-04-30 ENCOUNTER — Other Ambulatory Visit: Payer: Self-pay

## 2020-04-30 DIAGNOSIS — M545 Low back pain, unspecified: Secondary | ICD-10-CM

## 2020-05-02 ENCOUNTER — Ambulatory Visit: Payer: 59 | Admitting: Physical Therapy

## 2020-05-02 ENCOUNTER — Other Ambulatory Visit: Payer: Self-pay

## 2020-05-02 ENCOUNTER — Encounter: Payer: Self-pay | Admitting: Physical Therapy

## 2020-05-02 DIAGNOSIS — M6281 Muscle weakness (generalized): Secondary | ICD-10-CM

## 2020-05-02 DIAGNOSIS — M545 Low back pain, unspecified: Secondary | ICD-10-CM

## 2020-05-02 DIAGNOSIS — R29898 Other symptoms and signs involving the musculoskeletal system: Secondary | ICD-10-CM | POA: Diagnosis not present

## 2020-05-02 NOTE — Patient Instructions (Signed)
Access Code: [facility] URL: [email or web address] Date: 05/02/2020 Prepared by: Faustino Congress  Exercises Supine Bridge - 2 x daily - 7 x weekly - 10 reps - 1 sets - 5 sec hold Supine Piriformis Stretch with Foot on Ground - 2 x daily - 7 x weekly - 3 reps - 1 sets - 30 sec hold Hooklying Single Knee to Chest - 2 x daily - 7 x weekly - 3 reps - 1 sets - 30 sec hold Supine Double Knee to Chest - 2 x daily - 7 x weekly - 3 reps - 1 sets - 30 sec hold Standing Glute Med Mobilization with Small Ball on Wall - 2 x daily - 7 x weekly - 1 sets - 1 reps - 2-3 min hold

## 2020-05-02 NOTE — Therapy (Signed)
[facility] Physical Therapy [address] [place], [place], [address] Phone: [phone]   Fax:  [phone]  Physical Therapy Evaluation  Patient Details  Name: Jacqueline Waters MRN: [id number] Date of Birth: 08-13-1955 Referring Provider (PT): Mcarthur Rossetti, MD   Encounter Date: 05/02/2020   PT End of Session - 05/02/20 1111    Visit Number 1    Number of Visits 12    Date for PT Re-Evaluation 06/13/20    PT Start Time 0930    PT Stop Time 1007    PT Time Calculation (min) 37 min    Activity Tolerance Patient tolerated treatment well    Behavior During Therapy [facility] for tasks assessed/performed           Past Medical History:  Diagnosis Date  . Breast cyst   . Essential hypertension   . HLD (hyperlipidemia)   . Vaginal delivery    x3  . VITAMIN D DEFICIENCY 07/16/2009   Qualifier: Diagnosis of  By: Royal Piedra NP, Tammy      Past Surgical History:  Procedure Laterality Date  . ABDOMINAL HYSTERECTOMY     for uterine cyst, ovaries remain (Dr. Zigmund Daniel)  . COLONOSCOPY  2010   WNL (Brodie)  . COLONOSCOPY  03/2019   TA, HP, diverticulosis rpt 3 yrs (Mansouraty)  . CYSTECTOMY Left     There were no vitals filed for this visit.    Subjective Assessment - 05/02/20 0929    Subjective Pt is a 64 y/o female who presents to OPPT for bil LBP radiating into hip if she continues to increase her activity.  Pain occurs when "I do something that irritates it."  Pt reports pain for about a month without known injury, but did notice it with forward bending.    Limitations Lifting;Standing    How long can you stand comfortably? 30-40 min    Diagnostic tests xrays: negative    Patient Stated Goals wants some answers, improve pain    Currently in Pain? Yes    Pain Score 0-No pain   up to 6/10   Pain Location Back    Pain Orientation Lower;Left;Right    Pain Descriptors / Indicators Dull    Pain Type Acute pain    Pain Onset 1 to 4 weeks ago    Pain  Frequency Intermittent    Aggravating Factors  forwars bending, standing for long periods    Pain Relieving Factors rest, supine bridges, supine trunk rotation with knees to chest              [facility] PT Assessment - 05/02/20 0935      Assessment   Medical Diagnosis M54.50 (ICD-10-CM) - Low back pain, unspecified back pain laterality, unspecified chronicity, unspecified whether sciatica present    Referring Provider (PT) Mcarthur Rossetti, MD    Onset Date/Surgical Date --   1 month   Hand Dominance Left    Next MD Visit 05/23/20    Prior Therapy n/a      Precautions   Precautions None      Restrictions   Weight Bearing Restrictions No      Balance Screen   Has the patient fallen in the past 6 months No    Has the patient had a decrease in activity level because of a fear of falling?  No    Is the patient reluctant to leave their home because of a fear of falling?  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, building services    Leisure [employer], walking, shopping      Cognition   Overall Cognitive Status Within Functional Limits for tasks assessed      Observation/Other Assessments   Focus on Therapeutic Outcomes (FOTO)  54 (predicted 71)      Posture/Postural Control   Posture/Postural Control --      ROM / Strength   AROM / PROM / Strength AROM;Strength      AROM   AROM Assessment Site Lumbar    Lumbar Flexion WNL    Lumbar Extension limited 25%    Lumbar - Right Side Bend WNL    Lumbar - Left Side Bend WNL    Lumbar - Right Rotation WNL    Lumbar - Left Rotation WNL      Strength   Strength  Assessment Site Hip    Right/Left Hip Right;Left    Right Hip Flexion 4/5    Right Hip Extension 3/5    Right Hip ABduction 3/5    Left Hip Flexion 4/5    Left Hip Extension 4/5    Left Hip ABduction 4/5      Palpation   Palpation comment trigger points bil glute med      Special Tests    Special Tests Lumbar    Lumbar Tests Slump Test;Straight Leg Raise      Slump test   Findings Negative      Straight Leg Raise   Findings Negative      Ambulation/Gait   Gait Comments amb WNL                      Objective measurements completed on examination: See above findings.       [facility] Adult PT Treatment/Exercise - 05/02/20 0935      Exercises   Exercises Other Exercises    Other Exercises  see pt instructions - verbally reviewed with pt      Manual Therapy   Manual therapy comments instructed in use of tennis ball for glute med STM                  PT Education - 05/02/20 1111    Education Details HEP    Person(s) Educated Patient    Methods Explanation;Demonstration;Handout    Comprehension Verbalized understanding;Returned demonstration;Need further instruction            PT Short Term Goals - 05/02/20 1124      PT SHORT TERM GOAL #1   Title independent with initial HEP    Status New    Target Date 05/23/20             PT Long Term Goals - 05/02/20 1124      PT LONG TERM GOAL #1   Title independent with advanced HEP    Status New    Target Date 06/13/20      PT LONG TERM GOAL #2   Title improve FOTO score to 71 for improved function    Status New    Target Date 06/13/20  PT LONG TERM GOAL #3   Title report ability to stand for full meal prep/cooking without increase in pain or needed rest for imrpoved function    Status New    Target Date 06/13/20      PT LONG TERM GOAL #4   Title demonstrate at least 4+/5 bil hip strength for imrproved function    Status New    Target Date 06/13/20                   Plan - 05/02/20 0957    Clinical Impression Statement Pt is a 64 y/o female who presents to OPPT for acute LBP which radiates to bil hips.  Pt overall has good understanding of her limitations, and has initial HEP from MD which has been helpful.  Pt demonstrates decreased strength and flexibility, as well as active trigger points in glute med.  She will benefit from PT to address deficits listed.    Personal Factors and Comorbidities Comorbidity 1    Comorbidities HTN    Examination-Activity Limitations Bend;Squat;Stand;Locomotion Level    Examination-Participation Restrictions Cleaning;Meal Prep;Laundry;Volunteer;Shop;Community Activity    Stability/Clinical Decision Making Stable/Uncomplicated    Clinical Decision Making Low    Rehab Potential Good    PT Frequency 2x / week   1-2x/wk   PT Treatment/Interventions ADLs/Self Care Home Management;Cryotherapy;Electrical Stimulation;Moist Heat;Traction;Therapeutic exercise;Therapeutic activities;Functional mobility training;Stair training;Gait training;Neuromuscular re-education;Patient/family education;Manual techniques;Taping;Dry needling;Passive range of motion    PT Next Visit Plan review HEP and add some hip/core strengthening    PT Home Exercise Plan Access Code: [email or web address]    Consulted and Agree with Plan of Care Patient           Patient will benefit from skilled therapeutic intervention in order to improve the following deficits and impairments:  Impaired flexibility, Decreased range of motion, Decreased strength, Pain, Increased fascial restricitons, Increased muscle spasms, Decreased mobility  Visit Diagnosis: Acute bilateral low back pain without sciatica - Plan: PT plan of care cert/re-cert  Muscle weakness (generalized) - Plan: PT plan of care cert/re-cert  Other symptoms and signs involving the musculoskeletal system - Plan: PT plan of care cert/re-cert     Problem List Patient Active Problem List   Diagnosis Date Noted  .  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 Health maintenance examination 12/05/2014  . Pedal edema 12/31/2011  . Vitamin D deficiency 07/16/2009  . HEADACHE, MIXED 07/03/2007  . HYPERCHOLESTEROLEMIA 06/28/2007  . Essential hypertension 06/28/2007  . DEGENERATIVE DISC DISEASE 06/28/2007  . BACK PAIN, LUMBAR 06/28/2007      Laureen Abrahams, PT, DPT 05/02/20 11:27 AM    [facility] Physical Therapy [address] [place], [place], [address] Phone: [phone]   Fax:  [phone]  Name: FATIMATA TALSMA MRN: [id number] Date of Birth: 02-23-1956

## 2020-05-06 ENCOUNTER — Encounter: Payer: Self-pay | Admitting: Rehabilitative and Restorative Service Providers"

## 2020-05-06 ENCOUNTER — Ambulatory Visit: Payer: 59 | Admitting: Rehabilitative and Restorative Service Providers"

## 2020-05-06 ENCOUNTER — Other Ambulatory Visit: Payer: Self-pay

## 2020-05-06 DIAGNOSIS — M6281 Muscle weakness (generalized): Secondary | ICD-10-CM

## 2020-05-06 DIAGNOSIS — R293 Abnormal posture: Secondary | ICD-10-CM

## 2020-05-06 DIAGNOSIS — M545 Low back pain, unspecified: Secondary | ICD-10-CM | POA: Diagnosis not present

## 2020-05-06 NOTE — Patient Instructions (Signed)
Access Code: [facility] URL: [email or web address] Date: 05/06/2020 Prepared by: Vista Mink  Exercises Supine Bridge - 2 x daily - 7 x weekly - 10 reps - 1 sets - 5 sec hold Supine Double Knee to Chest - 2 x daily - 7 x weekly - 3 reps - 1 sets - 30 sec hold Standing Glute Med Mobilization with Small Ball on Wall - 2 x daily - 7 x weekly - 1 sets - 1 reps - 2-3 min hold Single Knee to Chest Stretch - 2-3 x daily - 7 x weekly - 1 sets - 5 reps - 20 seconds hold Standing Lumbar Extension at [place] - 5 x daily - 7 x weekly - 1 sets - 5 reps - 3 seconds hold Standing Scapular Retraction - 5 x daily - 7 x weekly - 1 sets - 5 reps - 5 second hold Standing Hip Hiking - 2 x daily - 7 x weekly - 2 sets - 10 reps - 3 seconds hold Supine Gluteus Stretch - 2-3 x daily - 7 x weekly - 1 sets - 5 reps - 20 seconds hold

## 2020-05-06 NOTE — Therapy (Signed)
[facility] Physical Therapy [address] [place], [place], [address] Phone: [phone]   Fax:  [phone]  Physical Therapy Treatment  Patient Details  Name: Jacqueline Waters MRN: [id number] Date of Birth: [birth year] Referring Provider (PT): Mcarthur Rossetti, MD   Encounter Date: 05/06/2020   PT End of Session - 05/06/20 1230    Visit Number 2    Number of Visits 12    Date for PT Re-Evaluation 06/13/20    PT Start Time 1017    PT Stop Time 1100    PT Time Calculation (min) 43 min    Activity Tolerance Patient tolerated treatment well;No increased pain    Behavior During Therapy WFL for tasks assessed/performed           Past Medical History:  Diagnosis Date  . Breast cyst   . Essential hypertension   . HLD (hyperlipidemia)   . Vaginal delivery    x3  . VITAMIN D DEFICIENCY 07/16/2009   Qualifier: Diagnosis of  By: Royal Piedra NP, Tammy      Past Surgical History:  Procedure Laterality Date  . ABDOMINAL HYSTERECTOMY     for uterine cyst, ovaries remain (Dr. Zigmund Daniel)  . COLONOSCOPY  2010   WNL (Brodie)  . COLONOSCOPY  03/2019   TA, HP, diverticulosis rpt 3 yrs (Mansouraty)  . CYSTECTOMY Left     There were no vitals filed for this visit.   Subjective Assessment - 05/06/20 1227    Subjective [name] reports early HEP compliance.  She is not exercising regularly and we discussed adding walking to her HEP.    Limitations Lifting;Standing    How long can you stand comfortably? 30-40 min    Diagnostic tests xrays: negative    Patient Stated Goals wants some answers, improve pain    Currently in Pain? 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     Exercises   Exercises Lumbar      Lumbar Exercises: Stretches   Single Knee to Chest Stretch Left;Right;4 reps;20 seconds    Other Lumbar Stretch Exercise Knee to opposite shoulder (gluteal stretch) 4X 20 seconds      Lumbar Exercises: Standing   Scapular Retraction Strengthening;Both;10 reps   5 seconds   Other Standing Lumbar Exercises Trunk extension AROM    Other Standing Lumbar Exercises Hip hike (alternating with pelvic stabilization) 2 sets of 10 3 seconds      Lumbar Exercises: Supine   Bridge Non-compliant;10 reps;5 seconds                  PT Education - 05/06/20 1229    Education Details Reviewed and added to HEP.  Spent a  lot of time on education, posture and body mechanics (practical and verbal).    Person(s) Educated Patient    Methods Explanation;Demonstration;Verbal cues;Handout    Comprehension Verbalized understanding;Need further instruction;Returned demonstration;Verbal cues required            PT Short Term Goals - 05/06/20 1229      PT SHORT TERM GOAL #1   Title independent with initial HEP    Status On-going    Target Date 05/23/20             PT Long Term Goals - 05/06/20 1230      PT LONG TERM GOAL #1   Title independent with advanced HEP    Status On-going      PT LONG TERM GOAL #2   Title improve FOTO score to 71 for improved function    Status On-going      PT LONG TERM GOAL #3   Title report ability to stand for full meal prep/cooking without increase in pain or needed rest for imrpoved function    Status On-going      PT LONG TERM GOAL #4   Title demonstrate at least 4+/5 bil hip strength for imrproved function    Status On-going      PT  LONG TERM GOAL #5   Status On-going                 Plan - 05/06/20 1230    Clinical Impression Statement Pamla is a great PT candidate.  We reviewed her HEP with additions and modifications as appropriate.  Spent lots of time reviewing posture and body mechanics for practical application and long-term change.  Continue current plan with additional practical work to complement core strength work.    Personal Factors and Comorbidities Comorbidity 1    Comorbidities HTN    Examination-Activity Limitations Bend;Squat;Stand;Locomotion Level    Examination-Participation Restrictions Cleaning;Meal Prep;Laundry;Volunteer;Shop;Community Activity    Stability/Clinical Decision Making Stable/Uncomplicated    Rehab Potential Good    PT Frequency 2x / week   1-2x/wk   PT Treatment/Interventions ADLs/Self Care Home Management;Cryotherapy;Electrical Stimulation;Moist Heat;Traction;Therapeutic exercise;Therapeutic activities;Functional mobility training;Stair training;Gait training;Neuromuscular re-education;Patient/family education;Manual techniques;Taping;Dry needling;Passive range of motion    PT Next Visit Plan Body mechanics and core strengthening    PT Home Exercise Plan Access Code: [email or web address]    Consulted and Agree with Plan of Care Patient           Patient will benefit from skilled therapeutic intervention in order to improve the following deficits and impairments:  Impaired flexibility, Decreased range of motion, Decreased strength, Pain, Increased fascial restricitons, Increased muscle spasms, Decreased mobility  Visit Diagnosis: Abnormal posture  Muscle weakness (generalized)  Acute bilateral low back pain without sciatica     Problem List Patient Active Problem List   Diagnosis Date Noted  . 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 Health maintenance examination 12/05/2014  . Pedal edema  12/31/2011  . Vitamin D deficiency 07/16/2009  . HEADACHE, MIXED 07/03/2007  . HYPERCHOLESTEROLEMIA 06/28/2007  . Essential hypertension 06/28/2007  . DEGENERATIVE DISC DISEASE 06/28/2007  . BACK PAIN, LUMBAR 06/28/2007    Farley Ly PT, MPT 05/06/2020, 12:33 PM  [facility] Physical Therapy [address] [place], [place], [address] Phone: [phone]   Fax:  [phone]  Name: KELIS PLASSE MRN: [id number] Date of Birth: 1955-06-22

## 2020-05-08 ENCOUNTER — Encounter: Payer: Self-pay | Admitting: Physical Therapy

## 2020-05-08 ENCOUNTER — Ambulatory Visit: Payer: 59 | Admitting: Physical Therapy

## 2020-05-08 ENCOUNTER — Other Ambulatory Visit: Payer: Self-pay

## 2020-05-08 DIAGNOSIS — R29898 Other symptoms and signs involving the musculoskeletal system: Secondary | ICD-10-CM

## 2020-05-08 DIAGNOSIS — M545 Low back pain, unspecified: Secondary | ICD-10-CM | POA: Diagnosis not present

## 2020-05-08 DIAGNOSIS — R293 Abnormal posture: Secondary | ICD-10-CM | POA: Diagnosis not present

## 2020-05-08 DIAGNOSIS — M6281 Muscle weakness (generalized): Secondary | ICD-10-CM | POA: Diagnosis not present

## 2020-05-08 NOTE — Therapy (Signed)
[facility] Physical Therapy [address] [place], [place], [address] Phone: [phone]   Fax:  [phone]  Physical Therapy Treatment  Patient Details  Name: Jacqueline Waters MRN: [id number] Date of Birth: Mar 02, 1956 Referring Provider (PT): Mcarthur Rossetti, MD   Encounter Date: 05/08/2020   PT End of Session - 05/08/20 0922    Visit Number 3    Number of Visits 12    Date for PT Re-Evaluation 06/13/20    PT Start Time 0833    PT Stop Time 0913    PT Time Calculation (min) 40 min    Activity Tolerance Patient tolerated treatment well;No increased pain    Behavior During Therapy WFL for tasks assessed/performed           Past Medical History:  Diagnosis Date  . Breast cyst   . Essential hypertension   . HLD (hyperlipidemia)   . Vaginal delivery    x3  . VITAMIN D DEFICIENCY 07/16/2009   Qualifier: Diagnosis of  By: Royal Piedra NP, Tammy      Past Surgical History:  Procedure Laterality Date  . ABDOMINAL HYSTERECTOMY     for uterine cyst, ovaries remain (Dr. Zigmund Daniel)  . COLONOSCOPY  2010   WNL (Brodie)  . COLONOSCOPY  03/2019   TA, HP, diverticulosis rpt 3 yrs (Mansouraty)  . CYSTECTOMY Left     There were no vitals filed for this visit.   Subjective Assessment - 05/08/20 0839    Subjective glute med exercise causes some muscle fatigue, but overall doing well.  feels she's able to do a little more.    Limitations Lifting;Standing    How long can you stand comfortably? 30-40 min    Diagnostic tests xrays: negative    Patient Stated Goals wants some answers, improve pain    Currently in Pain? No/denies                             [facility] Adult PT Treatment/Exercise - 05/08/20 0834      Self-Care   Self-Care Other Self-Care Comments    Other Self-Care Comments  educated on DN and benefits, risks, precautions - answered all questions and pt will consider      Lumbar Exercises: Stretches   Single Knee to Chest  Stretch Left;Right;20 seconds;3 reps    Piriformis Stretch Right;Left;3 reps;20 seconds      Lumbar Exercises: Aerobic   Nustep L6 x 8 min      Lumbar Exercises: Supine   Bridge Non-compliant;10 reps;5 seconds      Lumbar Exercises: Sidelying   Clam Right;Left;10 reps    Clam Limitations L2 band; with core engagement                  PT Education - 05/08/20 0921    Education Details DN            PT Short Term Goals - 05/06/20 1229      PT SHORT TERM GOAL #1   Title independent with initial HEP    Status On-going    Target Date 05/23/20             PT Long Term Goals - 05/06/20 1230      PT LONG TERM GOAL #1   Title independent with advanced HEP    Status On-going      PT LONG TERM GOAL #2   Title improve FOTO score to 71 for improved function  Status On-going      PT LONG TERM GOAL #3   Title report ability to stand for full meal prep/cooking without increase in pain or needed rest for imrpoved function    Status On-going      PT LONG TERM GOAL #4   Title demonstrate at least 4+/5 bil hip strength for imrproved function    Status On-going      PT LONG TERM GOAL #5   Status On-going                 Plan - 05/08/20 2536    Clinical Impression Statement Pt tolerated session well today without increase in pain today.  Will continue to benefit from PT to maximize function.  Feel she will benefit from DN but at this time wants to think it over.    Personal Factors and Comorbidities Comorbidity 1    Comorbidities HTN    Examination-Activity Limitations Bend;Squat;Stand;Locomotion Level    Examination-Participation Restrictions Cleaning;Meal Prep;Laundry;Volunteer;Shop;Community Activity    Stability/Clinical Decision Making Stable/Uncomplicated    Rehab Potential Good    PT Frequency 2x / week   1-2x/wk   PT Treatment/Interventions ADLs/Self Care Home Management;Cryotherapy;Electrical Stimulation;Moist Heat;Traction;Therapeutic  exercise;Therapeutic activities;Functional mobility training;Stair training;Gait training;Neuromuscular re-education;Patient/family education;Manual techniques;Taping;Dry needling;Passive range of motion    PT Next Visit Plan Body mechanics and core strengthening, check on possible DN    PT Home Exercise Plan Access Code: [email or web address]    Consulted and Agree with Plan of Care Patient           Patient will benefit from skilled therapeutic intervention in order to improve the following deficits and impairments:  Impaired flexibility, Decreased range of motion, Decreased strength, Pain, Increased fascial restricitons, Increased muscle spasms, Decreased mobility  Visit Diagnosis: Abnormal posture  Muscle weakness (generalized)  Acute bilateral low back pain without sciatica  Other symptoms and signs involving the musculoskeletal system     Problem List Patient Active Problem List   Diagnosis Date Noted  . 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 Health maintenance examination 12/05/2014  . Pedal edema 12/31/2011  . Vitamin D deficiency 07/16/2009  . HEADACHE, MIXED 07/03/2007  . HYPERCHOLESTEROLEMIA 06/28/2007  . Essential hypertension 06/28/2007  . DEGENERATIVE DISC DISEASE 06/28/2007  . BACK PAIN, LUMBAR 06/28/2007     Laureen Abrahams, PT, DPT 05/08/20 9:24 AM    [facility] Physical Therapy [address] [place], [place], [address] Phone: [phone]   Fax:  [phone]  Name: Jacqueline Waters MRN: [id number] Date of Birth: December 12, 1955

## 2020-05-16 ENCOUNTER — Encounter: Payer: Self-pay | Admitting: Rehabilitative and Restorative Service Providers"

## 2020-05-16 ENCOUNTER — Other Ambulatory Visit: Payer: Self-pay

## 2020-05-16 ENCOUNTER — Ambulatory Visit: Payer: 59 | Admitting: Rehabilitative and Restorative Service Providers"

## 2020-05-16 DIAGNOSIS — M545 Low back pain, unspecified: Secondary | ICD-10-CM | POA: Diagnosis not present

## 2020-05-16 DIAGNOSIS — M6281 Muscle weakness (generalized): Secondary | ICD-10-CM

## 2020-05-16 DIAGNOSIS — R293 Abnormal posture: Secondary | ICD-10-CM | POA: Diagnosis not present

## 2020-05-16 NOTE — Therapy (Signed)
[facility] Physical Therapy [address] [place], [place], [address] Phone: [phone]   Fax:  [phone]  Physical Therapy Treatment  Patient Details  Name: Jacqueline Waters MRN: [id number] Date of Birth: [birth year] Referring Provider (PT): Mcarthur Rossetti, MD   Encounter Date: 05/16/2020   PT End of Session - 05/16/20 1024    Visit Number 4    Number of Visits 12    Date for PT Re-Evaluation 06/13/20    PT Start Time 0931    PT Stop Time 8088    PT Time Calculation (min) 43 min    Activity Tolerance Patient tolerated treatment well;No increased pain    Behavior During Therapy WFL for tasks assessed/performed           Past Medical History:  Diagnosis Date  . Breast cyst   . Essential hypertension   . HLD (hyperlipidemia)   . Vaginal delivery    x3  . VITAMIN D DEFICIENCY 07/16/2009   Qualifier: Diagnosis of  By: Royal Piedra NP, Tammy      Past Surgical History:  Procedure Laterality Date  . ABDOMINAL HYSTERECTOMY     for uterine cyst, ovaries remain (Dr. Zigmund Daniel)  . COLONOSCOPY  2010   WNL (Brodie)  . COLONOSCOPY  03/2019   TA, HP, diverticulosis rpt 3 yrs (Mansouraty)  . CYSTECTOMY Left     There were no vitals filed for this visit.   Subjective Assessment - 05/16/20 1014    Subjective Jacqueline Waters notes good early compliance with her HEP.  Body mechanics are much better as is postural awareness.    Limitations Lifting;Standing    How long can you stand comfortably? 30-40 min    Diagnostic tests xrays: negative    Patient Stated Goals wants some answers, improve pain    Currently in Pain? Yes    Pain Location Back    Pain Orientation Lower    Pain Descriptors / Indicators Aching    Pain Type Acute pain    Pain Onset 1 to 4 weeks ago    Pain Frequency Intermittent    Aggravating Factors  Flexed and prolonged postures    Pain Relieving Factors Change of position and exercises    Effect of Pain on Daily Activities Limits kitchen  work    Multiple Pain Sites No                             [facility] Adult PT Treatment/Exercise - 05/16/20 0001      Therapeutic Activites    Therapeutic Activities ADL's;Lifting;Work Biomedical scientist, golfers and diagonal lifts 15 minutes     Exercises   Exercises Lumbar      Lumbar Exercises: Stretches   Single Knee to Chest Stretch Left;Right;20 seconds;3 reps    Other Lumbar Stretch Exercise Knee to opposite shoulder (gluteal stretch) 4X 20 seconds      Lumbar Exercises: Standing   Scapular Retraction Strengthening;Both;10 reps   5 seconds   Other Standing Lumbar Exercises Trunk extension AROM      Lumbar Exercises: Seated   Sit to Stand 10 reps   slow eccentrics     Lumbar Exercises: Sidelying   Hip Abduction Both;10 reps;3 seconds   2 sets 1/4 turn to stomach     Lumbar Exercises: Prone   Straight Leg Raise 10 reps;3 seconds   2 sets prone leg extensions (alternating)  PT Education - 05/16/20 1022    Education Details Reviewed and updated HEP.  Modified hip abductors strengthening.  Reviewed posture and practical body mechanics.    Person(s) Educated Patient    Methods Explanation;Demonstration;Tactile cues;Verbal cues;Handout    Comprehension Verbal cues required;Need further instruction;Returned demonstration;Verbalized understanding;Tactile cues required            PT Short Term Goals - 05/16/20 1024      PT SHORT TERM GOAL #1   Title independent with initial HEP    Status Achieved    Target Date 05/23/20             PT Long Term Goals - 05/16/20 1024      PT LONG TERM GOAL #1   Title independent with advanced HEP    Status On-going      PT LONG TERM GOAL #2   Title improve FOTO score to 71 for improved function    Status On-going      PT LONG TERM GOAL #3   Title report ability to stand for full meal prep/cooking without increase in pain or needed rest for imrpoved function    Status  On-going      PT LONG TERM GOAL #4   Title demonstrate at least 4+/5 bil hip strength for imrproved function    Status On-going      PT LONG TERM GOAL #5   Status On-going                 Plan - 05/16/20 1025    Clinical Impression Statement Jacqueline Waters reports feeling better since starting her PT, particularly with improved body mechanics in the kitchen.  Continued strength work (hip abductors and spine extensors) will complement postural work and help [employer] meet LTGs established at evaluation.    Personal Factors and Comorbidities Comorbidity 1    Comorbidities HTN    Examination-Activity Limitations Bend;Squat;Stand;Locomotion Level    Examination-Participation Restrictions Cleaning;Meal Prep;Laundry;Volunteer;Shop;Community Activity    Stability/Clinical Decision Making Stable/Uncomplicated    Rehab Potential Good    PT Frequency 2x / week   1-2x/wk   PT Treatment/Interventions ADLs/Self Care Home Management;Cryotherapy;Electrical Stimulation;Moist Heat;Traction;Therapeutic exercise;Therapeutic activities;Functional mobility training;Stair training;Gait training;Neuromuscular re-education;Patient/family education;Manual techniques;Taping;Dry needling;Passive range of motion    PT Next Visit Plan Body mechanics and core strengthening, check on possible DN    PT Home Exercise Plan Access Code: [email or web address]    Consulted and Agree with Plan of Care Patient           Patient will benefit from skilled therapeutic intervention in order to improve the following deficits and impairments:  Impaired flexibility, Decreased range of motion, Decreased strength, Pain, Increased fascial restricitons, Increased muscle spasms, Decreased mobility  Visit Diagnosis: Abnormal posture  Muscle weakness (generalized)  Acute bilateral low back pain without sciatica     Problem List Patient Active Problem List   Diagnosis Date Noted  . Surgical menopause on hormone replacement therapy  07/31/2019  . 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 Health maintenance examination 12/05/2014  . Pedal edema 12/31/2011  . Vitamin D deficiency 07/16/2009  . HEADACHE, MIXED 07/03/2007  . HYPERCHOLESTEROLEMIA 06/28/2007  . Essential hypertension 06/28/2007  . DEGENERATIVE DISC DISEASE 06/28/2007  . BACK PAIN, LUMBAR 06/28/2007    Farley Ly PT, MPT 05/16/2020, 10:27 AM  [facility] [address] [place], [place], [address] Phone: [phone]   Fax:  [phone]  Name: DARLETH EUSTACHE MRN: [id number] Date of Birth: [birth year]

## 2020-05-21 ENCOUNTER — Other Ambulatory Visit (HOSPITAL_COMMUNITY): Payer: Self-pay | Admitting: Dentistry

## 2020-05-21 MED FILL — BISOPROLOL FUMARATE 5 MG TA: 5 | 90 days supply | Qty: 90 | Fill #1

## 2020-05-21 MED FILL — AMOXICILLIN 500 MG CAPSULE: 500 | 7 days supply | Qty: 21 | Fill #0

## 2020-05-22 ENCOUNTER — Telehealth: Payer: Self-pay

## 2020-05-22 ENCOUNTER — Ambulatory Visit: Payer: 59 | Admitting: Physical Therapy

## 2020-05-22 ENCOUNTER — Other Ambulatory Visit: Payer: Self-pay

## 2020-05-22 ENCOUNTER — Encounter: Payer: Self-pay | Admitting: Physical Therapy

## 2020-05-22 DIAGNOSIS — M545 Low back pain, unspecified: Secondary | ICD-10-CM | POA: Diagnosis not present

## 2020-05-22 DIAGNOSIS — M6281 Muscle weakness (generalized): Secondary | ICD-10-CM | POA: Diagnosis not present

## 2020-05-22 DIAGNOSIS — R293 Abnormal posture: Secondary | ICD-10-CM

## 2020-05-22 DIAGNOSIS — R29898 Other symptoms and signs involving the musculoskeletal system: Secondary | ICD-10-CM | POA: Diagnosis not present

## 2020-05-22 NOTE — Therapy (Signed)
[facility] Physical Therapy [address] [place], [place], [address] Phone: [phone]   Fax:  [phone]  Physical Therapy Treatment/Discharge Summary  Patient Details  Name: Jacqueline Waters MRN: [id number] Date of Birth: 06/18/1955 Referring Provider (PT): Mcarthur Rossetti, MD   Encounter Date: 05/22/2020   PT End of Session - 05/22/20 0958    Visit Number 5    Number of Visits 12    Date for PT Re-Evaluation 06/13/20    PT Start Time 0930    PT Stop Time 0958    PT Time Calculation (min) 28 min    Activity Tolerance Patient tolerated treatment well;No increased pain    Behavior During Therapy WFL for tasks assessed/performed           Past Medical History:  Diagnosis Date  . Breast cyst   . Essential hypertension   . HLD (hyperlipidemia)   . Vaginal delivery    x3  . VITAMIN D DEFICIENCY 07/16/2009   Qualifier: Diagnosis of  By: Royal Piedra NP, Tammy      Past Surgical History:  Procedure Laterality Date  . ABDOMINAL HYSTERECTOMY     for uterine cyst, ovaries remain (Dr. Zigmund Daniel)  . COLONOSCOPY  2010   WNL (Brodie)  . COLONOSCOPY  03/2019   TA, HP, diverticulosis rpt 3 yrs (Mansouraty)  . CYSTECTOMY Left     There were no vitals filed for this visit.   Subjective Assessment - 05/22/20 0926    Subjective feeling a lot better, doesn't think she will need dry needling    Limitations Lifting;Standing    How long can you stand comfortably? 30-40 min    Diagnostic tests xrays: negative    Patient Stated Goals wants some answers, improve pain    Currently in Pain? No/denies              [facility] PT Assessment - 05/22/20 0943      Assessment   Medical Diagnosis M54.50 (ICD-10-CM) - Low back pain, unspecified back pain laterality, unspecified chronicity, unspecified whether sciatica present      Observation/Other Assessments   Focus on Therapeutic Outcomes (FOTO)  74      Strength   Right Hip Flexion 5/5    Right Hip Extension  5/5    Right Hip ABduction 5/5    Left Hip Flexion 5/5    Left Hip Extension 5/5    Left Hip ABduction 5/5                         [facility] Adult PT Treatment/Exercise - 05/22/20 2025      Exercises   Other Exercises  reviewed through HEP-performed 1-2 reps demonstrating compliance and proper technique.      Lumbar Exercises: Stretches   Gastroc Stretch 3 reps;30 seconds    Gastroc Stretch Limitations slantboard      Lumbar Exercises: Aerobic   Nustep L6 x 8 min      Lumbar Exercises: Standing   Heel Raises 20 reps    Heel Raises Limitations off slantboard                    PT Short Term Goals - 05/16/20 1024      PT SHORT TERM GOAL #1   Title independent with initial HEP    Status Achieved    Target Date 05/23/20             PT Long Term Goals - 05/22/20 4270  PT LONG TERM GOAL #1   Title independent with advanced HEP    Status Achieved      PT LONG TERM GOAL #2   Title improve FOTO score to 71 for improved function    Status Achieved      PT LONG TERM GOAL #3   Title report ability to stand for full meal prep/cooking without increase in pain or needed rest for imrpoved function    Status Achieved      PT LONG TERM GOAL #4   Title demonstrate at least 4+/5 bil hip strength for imrproved function    Status Achieved                 Plan - 05/22/20 0959    Clinical Impression Statement Pt has met all LTGs and feels ready for d/c.  She has a great understanding of her HEP and mechanics to decrease risk of reinjury.  Will d/c PT today.    Personal Factors and Comorbidities Comorbidity 1    Comorbidities HTN    Examination-Activity Limitations Bend;Squat;Stand;Locomotion Level    Examination-Participation Restrictions Cleaning;Meal Prep;Laundry;Volunteer;Shop;Community Activity    Stability/Clinical Decision Making Stable/Uncomplicated    Rehab Potential Good    PT Frequency 2x / week   1-2x/wk   PT Treatment/Interventions  ADLs/Self Care Home Management;Cryotherapy;Electrical Stimulation;Moist Heat;Traction;Therapeutic exercise;Therapeutic activities;Functional mobility training;Stair training;Gait training;Neuromuscular re-education;Patient/family education;Manual techniques;Taping;Dry needling;Passive range of motion    PT Next Visit Plan d/c PT today    PT Home Exercise Plan Access Code: [facility]    Consulted and Agree with Plan of Care Patient           Patient will benefit from skilled therapeutic intervention in order to improve the following deficits and impairments:  Impaired flexibility, Decreased range of motion, Decreased strength, Pain, Increased fascial restricitons, Increased muscle spasms, Decreased mobility  Visit Diagnosis: Abnormal posture  Muscle weakness (generalized)  Acute bilateral low back pain without sciatica  Other symptoms and signs involving the musculoskeletal system     Problem List Patient Active Problem List   Diagnosis Date Noted  . 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 Health maintenance examination 12/05/2014  . Pedal edema 12/31/2011  . Vitamin D deficiency 07/16/2009  . HEADACHE, MIXED 07/03/2007  . HYPERCHOLESTEROLEMIA 06/28/2007  . Essential hypertension 06/28/2007  . DEGENERATIVE DISC DISEASE 06/28/2007  . BACK PAIN, LUMBAR 06/28/2007     Laureen Abrahams, PT, DPT 05/22/20 10:00 AM    [facility] Physical Therapy [address] [place], [place], [address] Phone: [phone]   Fax:  [phone]  Name: Jacqueline Waters MRN: [id number] Date of Birth: [birth year]     PHYSICAL THERAPY DISCHARGE SUMMARY  Visits from Start of Care: 5  Current functional level related to goals / functional outcomes: See above   Remaining deficits: n/a   Education / Equipment: HEP  Plan: Patient agrees to discharge.  Patient goals were met. Patient is being  discharged due to meeting the stated rehab goals.  ?????    Laureen Abrahams, PT, DPT 05/22/20 10:00 AM  [facility] Physical Therapy [address] [place], [place], [address] Phone: [phone]   Fax:  [phone]

## 2020-05-22 NOTE — Telephone Encounter (Signed)
Spoke with pt notifying her a copy of her immunizations is ready to pick up.  Pt also requests a copy of med list.  [Placed shot rec and med list at front office- yellow folders.]

## 2020-05-22 NOTE — Telephone Encounter (Signed)
Pt needs copy of immunizations she has done. She said particular the flu shot for this year. She needs this for an appointment she has with volunteer services on 05/28/20. She would like to pick it up as soon as possible. Please call patient when this is ready.

## 2020-05-23 ENCOUNTER — Ambulatory Visit: Payer: 59 | Admitting: Orthopaedic Surgery

## 2020-05-24 ENCOUNTER — Encounter: Payer: 59 | Admitting: Rehabilitative and Restorative Service Providers"

## 2020-05-29 ENCOUNTER — Encounter: Payer: Self-pay | Admitting: Physician Assistant

## 2020-05-29 ENCOUNTER — Ambulatory Visit: Payer: 59 | Admitting: Physician Assistant

## 2020-05-29 ENCOUNTER — Other Ambulatory Visit: Payer: Self-pay | Admitting: Physician Assistant

## 2020-05-29 DIAGNOSIS — M545 Low back pain, unspecified: Secondary | ICD-10-CM | POA: Diagnosis not present

## 2020-05-29 MED ORDER — METHOCARBAMOL 500 MG PO TABS: 500.0000 mg | ORAL_TABLET | Freq: Every day | ORAL | 1 refills | Status: DC

## 2020-05-29 MED FILL — METHOCARBAMOL 500 MG TABS: 500 | 30 days supply | Qty: 30 | Fill #0

## 2020-05-29 NOTE — Progress Notes (Signed)
Office Visit Note   Patient: Jacqueline Waters           Date of Birth: 16-Feb-1956           MRN: [id number] Visit Date: 05/29/2020              Requested by: Ria Bush, MD [address] [place],  [place] [address] PCP: Ria Bush, MD   Assessment & Plan: Visit Diagnoses:  1. Low back pain, unspecified back pain laterality, unspecified chronicity, unspecified whether sciatica present     Plan: After discussing conservative treatment versus further diagnostic imaging such as MRI and the reasons for getting an MRI would like to continue with conservative treatment.  We will add Robaxin for her to take at night.  She will continue her home exercise program.  Did show her some IT band stretching exercises she may benefit from cortisone injection in either hip in the future if her pain continues over the trochanteric region both hips.  She will follow-up with [place] as needed.  Questions encouraged and answered at length.  Follow-Up Instructions: No follow-ups on file.   Orders:  No orders of the defined types were placed in this encounter.  Meds ordered this encounter  Medications  . methocarbamol (ROBAXIN) 500 MG tablet    Sig: Take 1 tablet (500 mg total) by mouth at bedtime.    Dispense:  30 tablet    Refill:  1      Procedures: No procedures performed   Clinical Data: No additional findings.   Subjective: Chief Complaint  Patient presents with  . Lower Back - Follow-up    HPI Jacqueline Waters comes in today for follow-up of her low back pain.  She states she is about 60% better.  She states as long as she does the exercises and sticks with the bending techniques that therapy is shown already she does not have any back pain however on days that she does not do the exercises she can have up to 6 out of 10 pain in her low back particularly on the left side.  No radicular symptoms down either leg.  No waking pain.  She is no longer having constant pain in her back  like she was having prior to going to physical therapy.  She denies any pain in either hip. Review of Systems See HPI otherwise negative or noncontributory  Objective: Vital Signs: There were no vitals taken for this visit.  Physical Exam General: Well-developed well-nourished female no acute distress mood affect appropriate.   Psych alert and oriented x3 Ortho Exam Lower extremities 5 out of 5 strength throughout lower extremities against resistance.  Negative straight leg raise bilaterally.  Tenderness over the trochanteric region both hips.  Good range of motion of both hips without pain.  She has full forward flexion and extension lumbar spine without pain. Specialty Comments:  No specialty comments available.  Imaging: No results found.   PMFS History: Patient Active Problem List   Diagnosis Date Noted  . 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 Health maintenance examination 12/05/2014  . Pedal edema 12/31/2011  . Vitamin D deficiency 07/16/2009  . HEADACHE, MIXED 07/03/2007  . HYPERCHOLESTEROLEMIA 06/28/2007  . Essential hypertension 06/28/2007  . DEGENERATIVE DISC DISEASE 06/28/2007  . BACK PAIN, LUMBAR 06/28/2007   Past Medical History:  Diagnosis Date  . Breast cyst   . Essential hypertension   . HLD (  hyperlipidemia)   . Vaginal delivery    x3  . VITAMIN D DEFICIENCY 07/16/2009   Qualifier: Diagnosis of  By: Royal Piedra NP, Tammy      Family History  Problem Relation Age of Onset  . Hypertension Mother   . CAD Mother 37       s/p CABG  . Stroke Mother   . Colon polyps Mother   . Hypertension Father   . CAD Father 62       deceased from MI  . Stroke Father        left paralysis  . Breast cancer Half-Sister 3  . Diabetes Half-Sister   . Esophageal cancer Neg Hx   . Rectal cancer Neg Hx   . Stomach cancer Neg Hx   . Colon cancer Neg Hx     Past Surgical History:  Procedure  Laterality Date  . ABDOMINAL HYSTERECTOMY     for uterine cyst, ovaries remain (Dr. Zigmund Daniel)  . COLONOSCOPY  2010   WNL (Brodie)  . COLONOSCOPY  03/2019   TA, HP, diverticulosis rpt 3 yrs (Mansouraty)  . CYSTECTOMY Left    Social History   Occupational History  . Not on file  Tobacco Use  . Smoking status: Never Smoker  . Smokeless tobacco: Never Used  Vaping Use  . Vaping Use: Never used  Substance and Sexual Activity  . Alcohol use: No    Alcohol/week: 0.0 standard drinks  . Drug use: No  . Sexual activity: Yes    Partners: Male    Birth control/protection: Surgical

## 2020-06-03 ENCOUNTER — Encounter: Payer: Self-pay | Admitting: Radiology

## 2020-06-12 MED FILL — ESTRADIOL 2 MG TABS: 2 | 90 days supply | Qty: 90 | Fill #1

## 2020-06-27 ENCOUNTER — Other Ambulatory Visit: Payer: 59

## 2020-06-27 ENCOUNTER — Other Ambulatory Visit: Payer: Self-pay

## 2020-06-27 DIAGNOSIS — Z20822 Contact with and (suspected) exposure to covid-19: Secondary | ICD-10-CM

## 2020-06-29 LAB — NOVEL CORONAVIRUS, NAA: SARS-CoV-2, NAA: NOT DETECTED

## 2020-06-29 LAB — SARS-COV-2, NAA 2 DAY TAT

## 2020-07-01 MED FILL — AMLODIPINE-BENAZEPRIL 10-20: 10-20 | 90 days supply | Qty: 90 | Fill #1

## 2020-07-01 MED FILL — ATORVASTATIN CALCIUM 20 MG: 20 | 90 days supply | Qty: 90 | Fill #1

## 2020-07-02 ENCOUNTER — Ambulatory Visit: Payer: 59

## 2020-08-01 MED FILL — SPIRONOLACTONE 25 MG TABS: 25 | 90 days supply | Qty: 90 | Fill #2

## 2020-08-08 MED FILL — BISOPROLOL FUMARATE 5 MG TA: 5 | 90 days supply | Qty: 90 | Fill #2

## 2020-08-10 DIAGNOSIS — Z20822 Contact with and (suspected) exposure to covid-19: Secondary | ICD-10-CM | POA: Diagnosis not present

## 2020-08-10 DIAGNOSIS — Z03818 Encounter for observation for suspected exposure to other biological agents ruled out: Secondary | ICD-10-CM | POA: Diagnosis not present

## 2020-08-20 MED FILL — BISOPROLOL FUMARATE 5 MG TA: 5 | 90 days supply | Qty: 90 | Fill #2

## 2020-09-25 ENCOUNTER — Other Ambulatory Visit (HOSPITAL_COMMUNITY): Payer: Self-pay

## 2020-09-25 ENCOUNTER — Other Ambulatory Visit: Payer: Self-pay | Admitting: Family Medicine

## 2020-09-25 ENCOUNTER — Other Ambulatory Visit: Payer: Self-pay

## 2020-09-25 ENCOUNTER — Encounter: Payer: Self-pay | Admitting: Family Medicine

## 2020-09-25 ENCOUNTER — Ambulatory Visit: Payer: 59 | Admitting: Family Medicine

## 2020-09-25 VITALS — BP 110/64 | HR 58 | Temp 97.6°F | Ht 62.5 in | Wt 145.8 lb

## 2020-09-25 DIAGNOSIS — Z1231 Encounter for screening mammogram for malignant neoplasm of breast: Secondary | ICD-10-CM

## 2020-09-25 DIAGNOSIS — H10022 Other mucopurulent conjunctivitis, left eye: Secondary | ICD-10-CM

## 2020-09-25 MED ORDER — NEOMYCIN-POLYMYXIN-GRAMICIDIN 1.75-10000-.025 OP SOLN
1.0000 [drp] | Freq: Four times a day (QID) | OPHTHALMIC | 0 refills | Status: DC
  Filled 2020-09-25 – 2020-09-26 (×2): qty 10, 50d supply, fill #0

## 2020-09-25 NOTE — Progress Notes (Signed)
Jacqueline Venturino T. Carmen Vallecillo, MD, [place]  Primary Care and Sports Medicine [facility] [place], [address]  Phone: [phone]  FAX: [phone]  Jacqueline Waters - 65 y.o. female  MRN [id number]  Date of Birth: [birth year]  Date: 09/25/2020  PCP: Ria Bush, MD  Referral: Ria Bush, MD  Chief Complaint  Patient presents with  . Eye Pain    Left-Drainage/Redness    This visit occurred during the SARS-CoV-2 public health emergency.  Safety protocols were in place, including screening questions prior to the visit, additional usage of staff PPE, and extensive cleaning of exam room while observing appropriate contact time as indicated for disinfecting solutions.   Subjective:   Jacqueline Waters is a 65 y.o. very pleasant female patient with Body mass index is 26.23 kg/m. who presents with the following:  Jacqueline Waters is here, because she has a sensation that something is in her eye.  This is a very nice patient and I have known her for quite some time.  She has some pink coloration to her conjunctive as well as crusting and drainage on the left side.  This is been present for approximately 3 days.  She did not have any sort of accidents where she had anything come directly in her eye, but last night did feel like she had something in it when she was moving her right blinking.  When she woe u it was a little bit crusty and droop.  Last night it was hurting some.  Never had been matted.    Review of Systems is noted in the HPI, as appropriate  Objective:   BP 110/64   Pulse (!) 58   Temp 97.6 F (36.4 C) (Temporal)   Ht 5' 2.5" (1.588 m)   Wt 145 lb 12 oz (66.1 kg)   SpO2 97%   BMI 26.23 kg/m   GEN: No acute distress; alert,appropriate. PULM: Breathing comfortably in no respiratory distress PSYCH: Normally interactive.  PERRLA and EOMI On fluorescein examination, there is no evidence for corneal ulcer or  abrasion.  Laboratory and Imaging Data:  Assessment and Plan:     ICD-10-CM   1. Pink eye, left  H10.022    Clinic consistent with conjunctivitis on the left side.  Anatomy and precautions were reviewed.  She will likely do well, and I reviewed the contagiousness of pinkeye.  She understands.  She was supposed to see her 82-year-old grandchildren tomorrow, but I do not think that is a good idea.  Meds ordered this encounter  Medications  . neomycin-polymyxin-gramicidin (NEOSPORIN) 1.75-10000-.025 ophthalmic solution    Sig: Place 1 drop into the left eye 4 (four) times daily.    Dispense:  10 mL    Refill:  0    Signed,  Elaynah Virginia T. Aengus Sauceda, MD   Outpatient Encounter Medications as of 09/25/2020  Medication Sig  . acetaminophen (TYLENOL) 500 MG tablet Take 500 mg by mouth daily as needed.  [name] amLODipine-benazepril (LOTREL) 10-20 MG capsule TAKE 1 CAPSULE BY MOUTH DAILY.  [name] atorvastatin (LIPITOR) 20 MG tablet TAKE 1 TABLET (20 MG TOTAL) BY MOUTH EVERY MORNING.  . bisoprolol (ZEBETA) 5 MG tablet TAKE 1 TABLET BY MOUTH ONCE DAILY.  . calcium carbonate (OS-CAL) 600 MG TABS tablet Take 600 mg by mouth daily with breakfast.  . Cholecalciferol (VITAMIN D3) 25 MCG (1000 UT) CAPS Take 1 capsule by mouth daily.  . diclofenac Sodium (VOLTAREN) 1 % GEL  Apply 2 g topically 4 (four) times daily.  [name] estradiol (ESTRACE) 2 MG tablet Take 1 tablet (2 mg total) by mouth daily.  . fluticasone (FLONASE) 50 MCG/ACT nasal spray Place 2 sprays into both nostrils daily.  [name] ibuprofen (ADVIL) 200 MG tablet Take 200 mg by mouth every 6 (six) hours as needed.  . methocarbamol (ROBAXIN) 500 MG tablet TAKE 1 TABLET (500 MG TOTAL) BY MOUTH AT BEDTIME.  . Multiple Vitamins-Minerals (CENTRUM PO) Take 1 tablet by mouth daily.  [name] neomycin-polymyxin-gramicidin (NEOSPORIN) 1.75-10000-.025 ophthalmic solution Place 1 drop into the left eye 4 (four) times daily.  . Probiotic Product (PROBIOTIC DAILY PO) Take by mouth.   . spironolactone (ALDACTONE) 25 MG tablet TAKE 1 TABLET (25 MG TOTAL) BY MOUTH DAILY.  . [DISCONTINUED] amoxicillin (AMOXIL) 500 MG capsule Take 500 mg by mouth 3 (three) times daily.  . [DISCONTINUED] amoxicillin (AMOXIL) 500 MG capsule TAKE 1 CAPSULE BY MOUTH 3 TIMES DAILY FOR 7 DAYS  . [DISCONTINUED] methylPREDNISolone (MEDROL DOSEPAK) 4 MG TBPK tablet TAKE ALL 6 TABLETS BY MOUTH ON DAY 1, THEN DECREASE BY ONE TABLET EACH DAY (6-5-4-3-2-1)   No facility-administered encounter medications on file as of 09/25/2020.

## 2020-09-26 ENCOUNTER — Other Ambulatory Visit (HOSPITAL_COMMUNITY): Payer: Self-pay

## 2020-10-15 ENCOUNTER — Other Ambulatory Visit (HOSPITAL_COMMUNITY): Payer: Self-pay

## 2020-10-15 MED FILL — Amlodipine Besylate-Benazepril HCl Cap 10-20 MG: ORAL | 90 days supply | Qty: 90 | Fill #0 | Status: AC

## 2020-11-07 ENCOUNTER — Other Ambulatory Visit (HOSPITAL_COMMUNITY): Payer: Self-pay

## 2020-11-07 MED FILL — Atorvastatin Calcium Tab 20 MG (Base Equivalent): ORAL | 90 days supply | Qty: 90 | Fill #0 | Status: AC

## 2020-11-07 MED FILL — Spironolactone Tab 25 MG: ORAL | 90 days supply | Qty: 90 | Fill #0 | Status: AC

## 2020-11-15 ENCOUNTER — Other Ambulatory Visit: Payer: Self-pay

## 2020-11-15 ENCOUNTER — Ambulatory Visit
Admission: RE | Admit: 2020-11-15 | Discharge: 2020-11-15 | Disposition: A | Discharge status: Home / Self Care | Payer: 59 | Source: Ambulatory Visit | Attending: Family Medicine | Admitting: Family Medicine

## 2020-11-15 DIAGNOSIS — Z1231 Encounter for screening mammogram for malignant neoplasm of breast: Secondary | ICD-10-CM

## 2020-12-12 ENCOUNTER — Telehealth: Payer: Self-pay

## 2020-12-12 NOTE — Telephone Encounter (Signed)
Left /vm that pt tested + for covid on home test 12/11/20. Pt started with symptoms on 12/11/20 with body aches, fever 101, head congestion and dry cough, pt had chills and S/T on 12/11/20 but no S/T or chills now. Pt has been taking plain Tylenol 325 mg taking one pill q6h. pt has hypertension and is 65 y.o. does pt need to take oral covid med or other medication. Pt has no other covid symptoms and is in no distress at this time. Pt scheduled video visit with Dr Diona Browner who approved video appt on 12/13/20 at 11:20 AM.pt will have vital signs ready when CMA calls. UC and ED precautions given and pt voiced understanding. Sending note to Dr Darnell Level as Juluis Rainier and Dr Diona Browner.

## 2020-12-12 NOTE — Telephone Encounter (Signed)
Noted  

## 2020-12-13 ENCOUNTER — Encounter: Payer: Self-pay | Admitting: Family Medicine

## 2020-12-13 ENCOUNTER — Telehealth (INDEPENDENT_AMBULATORY_CARE_PROVIDER_SITE_OTHER): Payer: 59 | Admitting: Family Medicine

## 2020-12-13 ENCOUNTER — Telehealth: Payer: Self-pay

## 2020-12-13 ENCOUNTER — Other Ambulatory Visit (HOSPITAL_COMMUNITY): Payer: Self-pay

## 2020-12-13 VITALS — BP 113/66 | HR 63 | Temp 99.4°F | Ht 62.5 in | Wt 143.0 lb

## 2020-12-13 DIAGNOSIS — U071 COVID-19: Secondary | ICD-10-CM | POA: Diagnosis not present

## 2020-12-13 MED ORDER — NIRMATRELVIR/RITONAVIR (PAXLOVID)TABLET
3.0000 | ORAL_TABLET | Freq: Two times a day (BID) | ORAL | 0 refills | Status: DC
  Filled 2020-12-13: qty 30, 5d supply, fill #0

## 2020-12-13 MED ORDER — NIRMATRELVIR/RITONAVIR (PAXLOVID) TABLET (RENAL DOSING)
2.0000 | ORAL_TABLET | Freq: Two times a day (BID) | ORAL | 0 refills | Status: DC
  Filled 2020-12-13: qty 20, 5d supply, fill #0

## 2020-12-13 NOTE — Telephone Encounter (Signed)
Pt left v/m that she recently had video visit with Dr Diona Browner and pt wants to know how long she should quarantine; pt started quarantine on Wed night 12/11/20. Pt request cb.

## 2020-12-13 NOTE — Telephone Encounter (Signed)
Erin, pharmacist from [place] called recommending with pts labs near 65 year old and GFR declining every year she recommends a renal dose for the pt instead of the full dose. She is trying to get the prescription ready because a family member is there to pick up the script.  Per Dr.  Diona Browner, it is ok to go with the reduced dose.  Per Junie Panning, pharmacist, reports reduced dose is nirmatrelvir, 2 tablets oral  BID, 5 days and take ritonavir, 1 tablet, BID, 5 days. The lower dose will be dispensed to pt.

## 2020-12-13 NOTE — Progress Notes (Signed)
VIRTUAL VISIT Due to national recommendations of social distancing due to [place] 19, a virtual visit is felt to be most appropriate for this patient at this time.   I connected with the patient on 12/13/20 at 11:20 AM EDT by virtual telehealth platform and verified that I am speaking with the correct person using two identifiers.   I discussed the limitations, risks, security and privacy concerns of performing an evaluation and management service by  virtual telehealth platform and the availability of in person appointments. I also discussed with the patient that there may be a patient responsible charge related to this service. The patient expressed understanding and agreed to proceed.  Patient location: Home Provider Location: [facility] Participants: Eliezer Lofts and Guido Sander   Chief Complaint  Patient presents with   Covid Positive    +Home Test on 12/11/2020   Cough   Headache   Sore Throat   Extremity Weakness    History of Present Illness: 65 year old female patient of Dr. Darnell Level with history of HTN presents with new diagnosis of COVID infection.  No chronic lung disease, no immunocompromised.   Symptoms started  6/29  She started with body ache and hot flashes, fatigued. Has cough, dry.. now progressed to nasal congestion and headache. No SOB, no chest pain. Low grade temp 99 F.   She has been taking tylenol.  BP Readings from Last 3 Encounters:  12/13/20 113/66  09/25/20 110/64  01/04/20 120/72      NO liver and kidney issues GFR 63 COVID 19 screen COVID testing:  home 6/29 COVID vaccine: COVID exposure: No recent travel or known exposure to [place]  The importance of social distancing was discussed today.    Review of Systems  All other systems reviewed and are negative.    Past Medical History:  Diagnosis Date   Breast cyst    Essential hypertension    HLD (hyperlipidemia)    Vaginal delivery    x3   VITAMIN D DEFICIENCY 07/16/2009    Qualifier: Diagnosis of  By: Royal Piedra NP, Tammy      reports that she has never smoked. She has never used smokeless tobacco. She reports that she does not drink alcohol and does not use drugs.   Current Outpatient Medications:    acetaminophen (TYLENOL) 500 MG tablet, Take 500 mg by mouth daily as needed., Disp: , Rfl:    amLODipine-benazepril (LOTREL) 10-20 MG capsule, TAKE 1 CAPSULE BY MOUTH DAILY., Disp: 90 capsule, Rfl: 3   atorvastatin (LIPITOR) 20 MG tablet, TAKE 1 TABLET (20 MG TOTAL) BY MOUTH EVERY MORNING., Disp: 90 tablet, Rfl: 3   bisoprolol (ZEBETA) 5 MG tablet, TAKE 1 TABLET BY MOUTH ONCE DAILY., Disp: 90 tablet, Rfl: 3   calcium carbonate (OS-CAL) 600 MG TABS tablet, Take 600 mg by mouth daily with breakfast., Disp: , Rfl:    Cholecalciferol (VITAMIN D3) 25 MCG (1000 UT) CAPS, Take 1 capsule by mouth daily., Disp: , Rfl:    diclofenac Sodium (VOLTAREN) 1 % GEL, Apply 2 g topically 4 (four) times daily., Disp: 100 g, Rfl: 2   estradiol (ESTRACE) 2 MG tablet, Take 1 tablet (2 mg total) by mouth daily., Disp: 90 tablet, Rfl: 5   fluticasone (FLONASE) 50 MCG/ACT nasal spray, Place 2 sprays into both nostrils daily., Disp: 16 g, Rfl: 6   ibuprofen (ADVIL) 200 MG tablet, Take 200 mg by mouth every 6 (six) hours as needed., Disp: , Rfl:  methocarbamol (ROBAXIN) 500 MG tablet, TAKE 1 TABLET (500 MG TOTAL) BY MOUTH AT BEDTIME., Disp: 30 tablet, Rfl: 1   Multiple Vitamins-Minerals (CENTRUM PO), Take 1 tablet by mouth daily., Disp: , Rfl:    Probiotic Product (PROBIOTIC DAILY PO), Take by mouth., Disp: , Rfl:    spironolactone (ALDACTONE) 25 MG tablet, TAKE 1 TABLET (25 MG TOTAL) BY MOUTH DAILY., Disp: 90 tablet, Rfl: 3   Observations/Objective: Blood pressure 113/66, pulse 63, temperature 99.4 F (37.4 C), temperature source Oral, height 5' 2.5" (1.588 m), weight 143 lb (64.9 kg).  Physical Exam  Physical Exam Constitutional:      General: The patient is not in acute  distress. Pulmonary:     Effort: Pulmonary effort is normal. No respiratory distress.  Neurological:     Mental Status: The patient is alert and oriented to person, place, and time.  Psychiatric:        Mood and Affect: Mood normal.        Behavior: Behavior normal.   Assessment and Plan Problem List Items Addressed This Visit     COVID-19 - Primary    COVID19  Infection < 5 days from onset of symptoms in  triple vaccinated overweight individual with no history of chronic lung disease.   No clear sign of bacterial infection at this time.   No SOB.  No red flags/need for ER visit or in-person exam at respiratory clinic at this time..    Pt higher risk for COVID complications given  age GFR  >60 and no medication contraindications.  Start paxlovid 5 day course. Reviewed course of medication and side effect profile with patient in detail.   Symptomatic care with mucinex and cough suppressant at night. If SOB begins symptoms worsening.. have low threshold for in-person exam, if severe shortness of breath ER visit recommended.  Can monitor Oxygen saturation at home with home monitor if able to obtain.  Go to ER if O2 sat < 90% on room air.   Reviewed home care and provided information through [place].  Recommended quarantine 5 days isolation recommended. Return to work day 6 and wear mask for 4 more days to complete 10 days. Provided info about prevention of spread of COVID 19.          I discussed the assessment and treatment plan with the patient. The patient was provided an opportunity to ask questions and all were answered. The patient agreed with the plan and demonstrated an understanding of the instructions.   The patient was advised to call back or seek an in-person evaluation if the symptoms worsen or if the condition fails to improve as anticipated.     Eliezer Lofts, MD

## 2020-12-13 NOTE — Telephone Encounter (Signed)
Junie Panning with [facility] left v/m about paxlovid rx. Pts GFR was 63 one year ago and per lab result GFR hx pts GFR goes down each year. If GFR below 60 supposed to use dose down version. Erin request cb to see if pt should get paxlovid dose down version or not. Sending note to Dr Diona Browner.

## 2020-12-13 NOTE — Patient Instructions (Addendum)
Hold atorvastatin , amlodipine and Flonase while on paxlovid.  Complete 5 days of paxlovid.  Follow BP daily.  Can use Mucinex DM twice daily.

## 2020-12-13 NOTE — Telephone Encounter (Signed)
Sent mychart note response.

## 2020-12-13 NOTE — Telephone Encounter (Signed)
Noted. Thank you to Dr B for seeing pt.

## 2020-12-19 ENCOUNTER — Other Ambulatory Visit (HOSPITAL_COMMUNITY): Payer: Self-pay

## 2020-12-19 MED FILL — Bisoprolol Fumarate Tab 5 MG: ORAL | 90 days supply | Qty: 90 | Fill #0 | Status: AC

## 2020-12-27 ENCOUNTER — Ambulatory Visit: Payer: 59 | Admitting: Family Medicine

## 2020-12-27 ENCOUNTER — Encounter: Payer: Self-pay | Admitting: Family Medicine

## 2020-12-27 ENCOUNTER — Telehealth: Payer: Self-pay

## 2020-12-27 ENCOUNTER — Other Ambulatory Visit: Payer: Self-pay

## 2020-12-27 DIAGNOSIS — M79671 Pain in right foot: Secondary | ICD-10-CM | POA: Diagnosis not present

## 2020-12-27 DIAGNOSIS — M25571 Pain in right ankle and joints of right foot: Secondary | ICD-10-CM | POA: Diagnosis not present

## 2020-12-27 NOTE — Telephone Encounter (Signed)
I spoke with pt and pt has had constant rt foot pain that is worse when walking or raising foot up since 12/26/20. Pt said pain level 6 - 7. Pt said hard to describe if pain is sharp or dull but said sometimes is like someone is puncturing the skin on and off. There is no known  injury, no swelling, bruising or redness. Dr Darnell Level had cancellation for this afternoon and pt scheduled with Dr Darnell Level 12/27/20 at 3 PM.  Sending note to Dr Darnell Level.

## 2020-12-27 NOTE — Telephone Encounter (Signed)
[facility] - Client TELEPHONE ADVICE RECORD [facility] Patient Name: Jacqueline Waters Gender: Female DOB: Oct 01, 1955 Age: 65 Y 36 M 14 D Return Phone Number: [phone] (Primary) Address[place] [address] Client [facility] - Client Client Site [place] Physician Ria Bush - MD Contact Type Call Who Is Calling Patient / Member / Family / Caregiver Call Type Triage / Clinical Relationship To Patient Self Return Phone Number [phone] (Primary) Chief Complaint Foot or Ankle Injury Reason for Call Symptomatic / Request for [place] states she has been experiencing foot and ankle pain. No appointments available. Translation No Nurse Assessment Nurse: Windle Guard, RN, Olin Hauser Date/Time (Eastern Time): 12/27/2020 9:00:25 AM Confirm and document reason for call. If symptomatic, describe symptoms. ---Caller states she has foot and ankle pain, states pain started yesterday morning, denies injury. Pain is only symptom. Pain increases when elevated, rates pain at 6-7. Does the patient have any new or worsening symptoms? ---Yes Will a triage be completed? ---Yes Related visit to physician within the last 2 weeks? ---No Does the PT have any chronic conditions? (i.e. diabetes, asthma, this includes High risk factors for pregnancy, etc.) ---Yes List chronic conditions. ---HTN< Hyperlipidemia Is this a behavioral health or substance abuse call? ---No Guidelines Guideline Title Affirmed Question Affirmed Notes Nurse Date/Time Eilene Ghazi Time) Foot Pain [1] MILD pain (e.g., does not interfere with normal activities) AND [2] present > 7 days Windle Guard, RN, Olin Hauser 12/27/2020 9:02:53 AM Disp. Time Eilene Ghazi Time) Disposition Final User 12/27/2020 9:06:54 AM See PCP within 2 Weeks Yes Conner, RN, Olin Hauser PLEASE NOTE: All timestamps  contained within this report are represented as [place] Standard Time. CONFIDENTIALTY NOTICE: This fax transmission is intended only for the addressee. It contains information that is legally privileged, confidential or otherwise protected from use or disclosure. If you are not the intended recipient, you are strictly prohibited from reviewing, disclosing, copying using or disseminating any of this information or taking any action in reliance on or regarding this information. If you have received this fax in error, please notify us immediately by telephone so that we can arrange for its return to [place]. Phone: [phone], Toll-Free: [phone], Fax: [phone] Page: 2 of 2 Call Id: [id number] [place] Disagree/Comply Comply Caller Understands Yes PreDisposition Call Doctor Care Advice Given Per Guideline SEE PCP WITHIN 2 WEEKS: * You need to be seen for this ongoing problem within the next 2 weeks. PAIN MEDICINES: * For pain relief, you can take either acetaminophen, ibuprofen, or naproxen. PAIN MEDICINES - EXTRA NOTES AND WARNINGS: CARE ADVICE given per Foot Pain (Adult) guideline. Referrals GO TO FACILITY UNDECIDED

## 2020-12-27 NOTE — Patient Instructions (Addendum)
I think you have tendonitis of medial ankle tendons  Treat conservatively with elevation, ice, compression of ankle (ASO brace) and tylenol or ibuprofen as needed. May also use voltaren gel to affected area up to three times a day .  If not improving with this, schedule follow up with Dr Lorelei Pont.   Posterior Tibial Tendinitis  Posterior tibial tendinitis is irritation of a tendon called the posterior tibial tendon. Your posterior tibial tendon is a cord-like tissue that connects bones of your lower leg and foot to a muscle that: Supports your arch. Helps you raise up on your toes. Helps you turn your foot down and in. This condition causes foot and ankle pain. It can also lead to a flat foot. What are the causes? This condition is most often caused by repeated stress to the tendon (overuse injury). It can also be caused by a sudden injury that stresses the tendon, such aslanding on your foot after jumping or falling. What increases the risk? This condition is more likely to develop in: People who play a sport that involves putting a lot of pressure on the feet, such as: Basketball. Tennis. Soccer. Hockey. Runners. Females who are older than 64 years of age and are overweight. People with diabetes. People with decreased foot stability. People with flat feet. What are the signs or symptoms? Symptoms include: Pain in the inner ankle. Pain at the arch of your foot. Pain that gets worse with running, walking, or standing. Swelling on the inside of your ankle and foot. Weakness in your ankle or foot. Inability to stand up on tiptoe. Flattening of the arch of your foot. How is this diagnosed? This condition may be diagnosed based on: Your symptoms. Your medical history. A physical exam. Tests, such as: X-ray. MRI. Ultrasound. How is this treated? This condition may be treated by: Putting ice to the injured area. Taking NSAIDs, such as ibuprofen, to reduce pain and  swelling. Wearing a special shoe or shoe insert to support your arch (orthotic). Having physical therapy. Replacing high-impact exercise with low-impact exercise, such as swimming or cycling. If your symptoms do not improve with these treatments, you may need to wear a splint, removable walking boot, or short leg cast for 6-8 weeks to keep your foot and ankle still (immobilized). Follow these instructions at home: If you have a cast, splint, or boot: Keep it clean and dry. Check the skin around it every day. Tell your health care provider about any concerns. If you have a cast: Do not stick anything inside it to scratch your skin. Doing that increases your risk of infection. You may put lotion on dry skin around the edges of the cast. Do not put lotion on the skin underneath the cast. If you have a splint or boot: Wear it as told by your health care provider. Remove it only as told by your health care provider. Loosen it if your toes tingle, become numb, or turn cold and blue. Bathing Do not take baths, swim, or use a hot tub until your health care provider approves. Ask your health care provider if you may take showers. If your cast, splint, or boot is not waterproof: Do not let it get wet. Cover it with a waterproof covering while you take a bath or a shower. Managing pain and swelling  If directed, put ice on the injured area. If you have a removable splint or boot, remove it as told by your health care provider. Put ice in a  plastic bag. Place a towel between your skin and the bag or between your cast and the bag. Leave the ice on for 20 minutes, 2-3 times a day. Move your toes often to reduce stiffness and swelling. Raise (elevate) the injured area above the level of your heart while you are sitting or lying down.  Activity Do not use the injured foot to support your body weight until your health care provider says that you can. Use crutches as told by your health care  provider. Do not do activities that make pain or swelling worse. Ask your health care provider when it is safe to drive if you have a cast, splint, or boot on your foot. Return to your normal activities as told by your health care provider. Ask your health care provider what activities are safe for you. Do exercises as told by your health care provider. General instructions Take over-the-counter and prescription medicines only as told by your health care provider. If you have an orthotic, use it as told by your health care provider. Keep all follow-up visits as told by your health care provider. This is important. How is this prevented? Wear footwear that is appropriate to your athletic activity. Avoid athletic activities that cause pain or swelling in your ankle or foot. Before being active, do range-of-motion and stretching exercises. If you develop pain or swelling while training, stop training. If you have pain or swelling that does not improve after a few days of rest, see your health care provider. If you start a new athletic activity, start gradually so you can build up your strength and flexibility. Contact a health care provider if: Your symptoms get worse. Your symptoms do not improve in 6-8 weeks. You develop new, unexplained symptoms. Your splint, boot, or cast gets damaged. Summary Posterior tibial tendinitis is irritation of a tendon called the posterior tibial tendon. This condition is most often caused by repeated stress to the tendon (overuse injury). This condition causes foot pain and ankle pain. It can also lead to a flat foot. This condition may be treated by not doing high-impact activities, applying ice, having physical therapy, wearing orthotics, and wearing a cast, splint, or boot if needed. This information is not intended to replace advice given to you by your health care provider. Make sure you discuss any questions you have with your healthcare  provider. Document Revised: 09/27/2018 Document Reviewed: 08/04/2018 Elsevier Patient Education  [place].

## 2020-12-27 NOTE — Progress Notes (Signed)
Patient ID: Jacqueline Waters, female    DOB: 28-Nov-1955, 65 y.o.   MRN: [id number]  This visit was conducted in person.  BP 138/80 (BP Location: Left Arm, Patient Position: Sitting, Cuff Size: Normal)   Pulse 69   Temp 98 F (36.7 C) (Temporal)   Wt 143 lb 8 oz (65.1 kg)   SpO2 100%   BMI 25.83 kg/m   BP Readings from Last 3 Encounters:  12/27/20 138/80  12/13/20 113/66  09/25/20 110/64    CC: R foot pain Subjective:   HPI: Jacqueline Waters is a 66 y.o. female presenting on 12/27/2020 for Right Foot Pain   1d h/o R leg/foot pain - pain starts at heel and travels up leg posteriorly to mid calf.  No noted bruise, redness or warmth. Denies inciting trauma or injury or fall.  No fevers/chills. Did travel to [facility] 6/28.  No h/o gout or prior ankle/heel pain.  She did have bug bite to R medial calf with residual discomfort - 2-3 days ago.   COVID illness late June treated with Paxlovid with mild residual fatigue.      Relevant past medical, surgical, family and social history reviewed and updated as indicated. Interim medical history since our last visit reviewed. Allergies and medications reviewed and updated. Outpatient Medications Prior to Visit  Medication Sig Dispense Refill   acetaminophen (TYLENOL) 500 MG tablet Take 500 mg by mouth daily as needed.     amLODipine-benazepril (LOTREL) 10-20 MG capsule TAKE 1 CAPSULE BY MOUTH DAILY. 90 capsule 3   atorvastatin (LIPITOR) 20 MG tablet TAKE 1 TABLET (20 MG TOTAL) BY MOUTH EVERY MORNING. 90 tablet 3   bisoprolol (ZEBETA) 5 MG tablet TAKE 1 TABLET BY MOUTH ONCE DAILY. 90 tablet 3   calcium carbonate (OS-CAL) 600 MG TABS tablet Take 600 mg by mouth daily with breakfast.     Cholecalciferol (VITAMIN D3) 25 MCG (1000 UT) CAPS Take 1 capsule by mouth daily.     diclofenac Sodium (VOLTAREN) 1 % GEL Apply 2 g topically 4 (four) times daily. 100 g 2   estradiol (ESTRACE) 2 MG tablet Take 1 tablet (2 mg total) by mouth daily. 90  tablet 5   ibuprofen (ADVIL) 200 MG tablet Take 200 mg by mouth every 6 (six) hours as needed.     methocarbamol (ROBAXIN) 500 MG tablet TAKE 1 TABLET (500 MG TOTAL) BY MOUTH AT BEDTIME. 30 tablet 1   Multiple Vitamins-Minerals (CENTRUM PO) Take 1 tablet by mouth daily.     Probiotic Product (PROBIOTIC DAILY PO) Take by mouth.     spironolactone (ALDACTONE) 25 MG tablet TAKE 1 TABLET (25 MG TOTAL) BY MOUTH DAILY. 90 tablet 3   nirmatrelvir/ritonavir EUA, renal dosing, (PAXLOVID) TABS Take 2 tablets by mouth 2 (two) times daily. 20 tablet 0   No facility-administered medications prior to visit.     Per HPI unless specifically indicated in ROS section below Review of Systems  Objective:  BP 138/80 (BP Location: Left Arm, Patient Position: Sitting, Cuff Size: Normal)   Pulse 69   Temp 98 F (36.7 C) (Temporal)   Wt 143 lb 8 oz (65.1 kg)   SpO2 100%   BMI 25.83 kg/m   Wt Readings from Last 3 Encounters:  12/27/20 143 lb 8 oz (65.1 kg)  12/13/20 143 lb (64.9 kg)  09/25/20 145 lb 12 oz (66.1 kg)      Physical Exam Vitals and nursing note reviewed.  Constitutional:  Appearance: Normal appearance. She is not ill-appearing.  Musculoskeletal:        General: Swelling and tenderness present. Normal range of motion.     Right lower leg: No edema.     Left lower leg: No edema.     Comments:  2+ DP bilaterally No palpable cords L ankle WNL R ankle:  No ankle ligament laxity pain with testing Neg calc squeeze test  No pain at navicular or base of 5th MT  Tender edema present medial ankle, reproducible tenderness with flexion of great toe against resistance  Evidence of flattening of longitudinal arch and "too many toes" sign  Able to perform heel lifts but more difficult than on left   Neurological:     Mental Status: She is alert.      Results for orders placed or performed in visit on 06/27/20  Novel Coronavirus, NAA ([facility])   Specimen: Nasopharyngeal(NP) swabs in vial  transport medium   Nasopharynge  Screenin  Result Value Ref Range   SARS-CoV-2, NAA Not Detected Not Detected  SARS-COV-2, NAA 2 DAY TAT   Nasopharynge  Screenin  Result Value Ref Range   SARS-CoV-2, NAA 2 DAY TAT Performed     Assessment & Plan:  This visit occurred during the SARS-CoV-2 public health emergency.  Safety protocols were in place, including screening questions prior to the visit, additional usage of staff PPE, and extensive cleaning of exam room while observing appropriate contact time as indicated for disinfecting solutions.   Problem List Items Addressed This Visit     Pain in right ankle    Story/exam most consistent with posterior tibial tendonitis.  rec tylenol, ibuprofen, ice, leg elevation, rest, and placed on ASO ankle brace for extra support. Discussed voltaren gel use.  Discussed f/u by Dr Lorelei Pont sports medicine if not improving with treatment.          No orders of the defined types were placed in this encounter.  No orders of the defined types were placed in this encounter.   Patient instructions: I think you have tendonitis of medial ankle tendons  Treat conservatively with elevation, ice, compression of ankle (ASO brace) and tylenol or ibuprofen as needed. May also use voltaren gel to affected area up to three times a day .  If not improving with this, schedule follow up with Dr Lorelei Pont.   Follow up plan: Return if symptoms worsen or fail to improve.  Ria Bush, MD

## 2020-12-28 DIAGNOSIS — M25571 Pain in right ankle and joints of right foot: Secondary | ICD-10-CM | POA: Insufficient documentation

## 2020-12-28 NOTE — Assessment & Plan Note (Addendum)
Story/exam most consistent with posterior tibial tendonitis.  rec tylenol, ibuprofen, ice, leg elevation, rest, and placed on ASO ankle brace for extra support. Discussed voltaren gel use.  Discussed f/u by Dr Lorelei Pont sports medicine if not improving with treatment.

## 2021-01-02 ENCOUNTER — Encounter: Payer: Self-pay | Admitting: Physician Assistant

## 2021-01-02 ENCOUNTER — Ambulatory Visit: Payer: 59 | Admitting: Physician Assistant

## 2021-01-02 ENCOUNTER — Other Ambulatory Visit (HOSPITAL_COMMUNITY): Payer: Self-pay

## 2021-01-02 DIAGNOSIS — M722 Plantar fascial fibromatosis: Secondary | ICD-10-CM

## 2021-01-02 DIAGNOSIS — M7661 Achilles tendinitis, right leg: Secondary | ICD-10-CM

## 2021-01-02 MED ORDER — METHYLPREDNISOLONE 4 MG PO TBPK
ORAL_TABLET | ORAL | 0 refills | Status: DC
  Filled 2021-01-02: qty 21, 6d supply, fill #0

## 2021-01-02 MED ORDER — DICLOFENAC SODIUM 1 % EX GEL
2.0000 g | Freq: Four times a day (QID) | CUTANEOUS | 2 refills | Status: AC
  Filled 2021-01-02: qty 100, 13d supply, fill #0

## 2021-01-02 NOTE — Progress Notes (Signed)
HPI: Jacqueline Waters comes in today for right foot pain.  She states she noticed this when she woke up last Thursday could not bear weight put the foot down.  She was seen by her PCP who placed her ASO brace.  Recommended Voltaren gel to the posterior tibial region.  She states her pain is changed since then she is now having pain in the heel of her foot and the backside of her heel.  She has had no new injury since seeing her primary care physician.  She is walking in flip-flops and ASO brace today.  She does note that the pain started more medial aspect just below the medial malleolus region.  She does wear a lot of open back shoes.  She has pain with first up in the morning.  Review of systems: See HPI otherwise negative  Physical exam: General: Well-developed well-nourished female no acute distress mood and affect appropriate. Psych: Alert and oriented x3 Right foot no rashes skin lesions ulcerations erythema.  No impending ulcers.  Dorsal pedal pulses 2+ sensation grossly intact throughout the foot.  Right calf supple nontender.  She is able to do a single heel raise bilaterally without pain.  She has of avoidance type gait and tries to offload the medial aspect of her right foot.  Negative too many toes sign.  Right Achilles is intact.  Tenderness Achilles insertion right foot.  She has no weakness with inversion eversion of either foot against resistance.  She is minimal tenderness over the posterior tibial tendon right foot.  No tenderness over the peroneal tendons.  Tight gastrocs bilaterally.  Impression: Right foot plantar fasciitis Right Achilles insertional tendinitis Tight gastrocs bilaterally.  Plan: Discontinue the ASO brace.  Gel heel cup was placed in both shoes.  She will avoid open back shoes.  Gastrocsoleus stretching exercises shown.  Placed her on a Medrol Dosepak.  She will apply Voltaren gel 4 g 4 times daily to the medial tubercle of the calcaneus of the right foot and the Achilles  insertion region.  We will see her back in 4 weeks see how she is doing overall.  Questions encouraged and answered.

## 2021-01-05 ENCOUNTER — Other Ambulatory Visit: Payer: Self-pay | Admitting: Family Medicine

## 2021-01-05 DIAGNOSIS — E7800 Pure hypercholesterolemia, unspecified: Secondary | ICD-10-CM

## 2021-01-05 DIAGNOSIS — E559 Vitamin D deficiency, unspecified: Secondary | ICD-10-CM

## 2021-01-06 ENCOUNTER — Other Ambulatory Visit (INDEPENDENT_AMBULATORY_CARE_PROVIDER_SITE_OTHER): Payer: 59

## 2021-01-06 ENCOUNTER — Other Ambulatory Visit: Payer: Self-pay

## 2021-01-06 ENCOUNTER — Other Ambulatory Visit (HOSPITAL_COMMUNITY): Payer: Self-pay

## 2021-01-06 DIAGNOSIS — E559 Vitamin D deficiency, unspecified: Secondary | ICD-10-CM

## 2021-01-06 DIAGNOSIS — E7800 Pure hypercholesterolemia, unspecified: Secondary | ICD-10-CM

## 2021-01-06 LAB — COMPREHENSIVE METABOLIC PANEL
ALT: 8 U/L (ref 0–35)
AST: 12 U/L (ref 0–37)
Albumin: 4.1 g/dL (ref 3.5–5.2)
Alkaline Phosphatase: 45 U/L (ref 39–117)
BUN: 25 mg/dL — ABNORMAL HIGH (ref 6–23)
CO2: 25 mEq/L (ref 19–32)
Calcium: 11.5 mg/dL — ABNORMAL HIGH (ref 8.4–10.5)
Chloride: 105 mEq/L (ref 96–112)
Creatinine, Ser: 1.13 mg/dL (ref 0.40–1.20)
GFR: 51.64 mL/min — ABNORMAL LOW (ref >60.00)
Glucose, Bld: 114 mg/dL — ABNORMAL HIGH (ref 70–99)
Potassium: 4.7 mEq/L (ref 3.5–5.1)
Sodium: 136 mEq/L (ref 135–145)
Total Bilirubin: 0.3 mg/dL (ref 0.2–1.2)
Total Protein: 7.3 g/dL (ref 6.0–8.3)

## 2021-01-06 LAB — LIPID PANEL
Cholesterol: 161 mg/dL (ref 0–200)
HDL: 49.6 mg/dL (ref >39.00)
LDL Cholesterol: 99 mg/dL (ref 0–99)
NonHDL: 111.07
Total CHOL/HDL Ratio: 3
Triglycerides: 58 mg/dL (ref 0.0–149.0)
VLDL: 11.6 mg/dL (ref 0.0–40.0)

## 2021-01-06 LAB — VITAMIN D 25 HYDROXY (VIT D DEFICIENCY, FRACTURES): VITD: 39.09 ng/mL (ref 30.00–100.00)

## 2021-01-06 LAB — TSH: TSH: 1.79 u[IU]/mL (ref 0.35–5.50)

## 2021-01-08 ENCOUNTER — Other Ambulatory Visit (HOSPITAL_COMMUNITY): Payer: Self-pay

## 2021-01-08 ENCOUNTER — Encounter: Payer: Self-pay | Admitting: Family Medicine

## 2021-01-08 ENCOUNTER — Ambulatory Visit (INDEPENDENT_AMBULATORY_CARE_PROVIDER_SITE_OTHER): Payer: 59 | Admitting: Family Medicine

## 2021-01-08 ENCOUNTER — Other Ambulatory Visit: Payer: Self-pay

## 2021-01-08 ENCOUNTER — Other Ambulatory Visit: Payer: Self-pay | Admitting: *Deleted

## 2021-01-08 VITALS — BP 120/72 | HR 62 | Temp 97.5°F | Ht 62.25 in | Wt 142.4 lb

## 2021-01-08 DIAGNOSIS — N289 Disorder of kidney and ureter, unspecified: Secondary | ICD-10-CM

## 2021-01-08 DIAGNOSIS — N183 Chronic kidney disease, stage 3 unspecified: Secondary | ICD-10-CM | POA: Insufficient documentation

## 2021-01-08 DIAGNOSIS — E7800 Pure hypercholesterolemia, unspecified: Secondary | ICD-10-CM

## 2021-01-08 DIAGNOSIS — Z79890 Hormone replacement therapy (postmenopausal): Secondary | ICD-10-CM | POA: Diagnosis not present

## 2021-01-08 DIAGNOSIS — I10 Essential (primary) hypertension: Secondary | ICD-10-CM

## 2021-01-08 DIAGNOSIS — E8940 Asymptomatic postprocedural ovarian failure: Secondary | ICD-10-CM

## 2021-01-08 DIAGNOSIS — N182 Chronic kidney disease, stage 2 (mild): Secondary | ICD-10-CM | POA: Insufficient documentation

## 2021-01-08 DIAGNOSIS — E559 Vitamin D deficiency, unspecified: Secondary | ICD-10-CM | POA: Diagnosis not present

## 2021-01-08 DIAGNOSIS — Z0000 Encounter for general adult medical examination without abnormal findings: Secondary | ICD-10-CM

## 2021-01-08 LAB — POC URINALSYSI DIPSTICK (AUTOMATED)
Bilirubin, UA: NEGATIVE
Blood, UA: NEGATIVE
Glucose, UA: NEGATIVE
Leukocytes, UA: NEGATIVE
Nitrite, UA: NEGATIVE
Protein, UA: NEGATIVE
Spec Grav, UA: >=1.03 — AB (ref 1.010–1.025)
Urobilinogen, UA: 0.2 E.U./dL
pH, UA: 5 (ref 5.0–8.0)

## 2021-01-08 MED ORDER — AMLODIPINE BESY-BENAZEPRIL HCL 5-20 MG PO CAPS
1.0000 | ORAL_CAPSULE | Freq: Every day | ORAL | 3 refills | Status: DC
  Filled 2021-01-08: qty 90, 90d supply, fill #0

## 2021-01-08 MED ORDER — BISOPROLOL FUMARATE 5 MG PO TABS
5.0000 mg | ORAL_TABLET | Freq: Every day | ORAL | 3 refills | Status: DC
Start: 2021-01-08 — End: 2022-01-12
  Filled 2021-01-08: qty 90, fill #0
  Filled 2021-03-12: qty 90, 90d supply, fill #0
  Filled 2021-06-12: qty 90, 90d supply, fill #1
  Filled 2021-09-16: qty 90, 90d supply, fill #2
  Filled 2021-12-22: qty 90, 90d supply, fill #3

## 2021-01-08 MED ORDER — ESTRADIOL 2 MG PO TABS
2.0000 mg | ORAL_TABLET | Freq: Every day | ORAL | 0 refills | Status: DC
  Filled 2021-01-08: qty 90, 90d supply, fill #0

## 2021-01-08 MED ORDER — AMLODIPINE BESY-BENAZEPRIL HCL 10-20 MG PO CAPS
1.0000 | ORAL_CAPSULE | Freq: Every day | ORAL | 3 refills | Status: DC
  Filled 2021-01-08: qty 90, 90d supply, fill #0

## 2021-01-08 MED ORDER — ATORVASTATIN CALCIUM 20 MG PO TABS
20.0000 mg | ORAL_TABLET | Freq: Every morning | ORAL | 3 refills | Status: DC
  Filled 2021-01-08: qty 90, fill #0
  Filled 2021-03-12: qty 90, 90d supply, fill #0
  Filled 2021-06-12: qty 90, 90d supply, fill #1
  Filled 2021-09-16: qty 90, 90d supply, fill #2
  Filled 2021-12-22: qty 90, 90d supply, fill #3

## 2021-01-08 MED ORDER — SPIRONOLACTONE 25 MG PO TABS
25.0000 mg | ORAL_TABLET | Freq: Every day | ORAL | 3 refills | Status: DC
  Filled 2021-01-08: qty 90, fill #0

## 2021-01-08 NOTE — Assessment & Plan Note (Signed)
Progressive renal insufficiency noted over the past few years with latest GFR below 60 and hypercalcemia. Will recommend increased water intake, already limits NSAIDs and other nephrotoxic agents, RTC 1-2 wks lab visit for further evaluation. UA today.

## 2021-01-08 NOTE — Assessment & Plan Note (Addendum)
Chronic, stable on lotrel, spironolactone, bisoprolol.  Progressive renal insufficiency noted today - see below.  Plastic surgeon wants her off amlodipine prior to surgery planned for December. Will drop lotrel to 5/'20mg'$  daily to start.  Will ask her to start monitoring BP more regularly, drop off log card at upcoming lab visit (provided today).

## 2021-01-08 NOTE — Assessment & Plan Note (Signed)
Chronic, stable. Continue current regimen.  The 10-year ASCVD risk score Jacqueline Waters., et al., 2013) is: 5.9%   Values used to calculate the score:     Age: 65 years     Sex: Female     Is Non-Hispanic African American: Yes     Diabetic: No     Tobacco smoker: No     Systolic Blood Pressure: 123456 mmHg     Is BP treated: Yes     HDL Cholesterol: 49.6 mg/dL     Total Cholesterol: 161 mg/dL

## 2021-01-08 NOTE — Progress Notes (Signed)
Patient ID: Jacqueline Waters, female    DOB: 06/02/1956, 65 y.o.   MRN: [id number]  This visit was conducted in person.  BP 120/72   Pulse 62   Temp (!) 97.5 F (36.4 C) (Temporal)   Ht 5' 2.25" (1.581 m)   Wt 142 lb 7 oz (64.6 kg)   SpO2 97%   BMI 25.84 kg/m   BP Readings from Last 3 Encounters:  01/08/21 120/72  12/27/20 138/80  12/13/20 113/66    CC: CPE Subjective:   HPI: Jacqueline Waters is a 65 y.o. female presenting on 01/08/2021 for Annual Exam   Seen 2 wks ago with R medial foot pain consistent with posterior tibial tendonitis treated with ASO brace, voltaren gel, tylenol, ibuprofen.  Saw orthopedics last week with R heel pain dx plantar fasciitis as well as R achilles tendinitis rec stop ASO brace, start gel heel cup and medrol dosepak.   Upcoming breast reduction surgery - states surgeon wants her off CCB due to ?bleeding risk.   Retired 02/2018. Doesn't regularly check BP at home. Last checked last month 0000000 systolic.    Preventative: COLONOSCOPY Date: 2010 WNL Olevia Perches) COLONOSCOPY 03/2019 - TA, HP, diverticulosis rpt 3 yrs ([place])  Well woman with OBGYN Dr Kennon Rounds upcoming appt next month - on qod estrace.  [facility] Lung cancer screening - not eligible  Flu shot at work  COVID vaccine - Moderna 08/2019, 09/2019, booster 04/2020  Tdap 2014  Shingrix - 12/2019, 04/2020 Seat belt use discussed  Sunscreen use discussed. 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 Interim medical history since our last visit reviewed. Allergies and medications reviewed and updated. Outpatient  Medications Prior to Visit  Medication Sig Dispense Refill   acetaminophen (TYLENOL) 500 MG tablet Take 500 mg by mouth daily as needed.     calcium carbonate (OS-CAL) 600 MG TABS tablet Take 600 mg by mouth daily with breakfast.     Cholecalciferol (VITAMIN D3) 25 MCG (1000 UT) CAPS Take 1 capsule by mouth daily.     diclofenac Sodium (VOLTAREN) 1 % GEL Apply 2 g topically 4 (four) times daily. 100 g 2   estradiol (ESTRACE) 2 MG tablet Take 1 tablet (2 mg total) by mouth daily. 90 tablet 5   ibuprofen (ADVIL) 200 MG tablet Take 200 mg by mouth every 6 (six) hours as needed.     methocarbamol (ROBAXIN) 500 MG tablet TAKE 1 TABLET (500 MG TOTAL) BY MOUTH AT BEDTIME. 30 tablet 1   Multiple Vitamins-Minerals (CENTRUM PO) Take 1 tablet by mouth daily.     Probiotic Product (PROBIOTIC DAILY PO) Take by mouth.     amLODipine-benazepril (LOTREL) 10-20 MG capsule TAKE 1 CAPSULE BY MOUTH DAILY. 90 capsule 3   atorvastatin (LIPITOR) 20 MG tablet TAKE 1 TABLET (20 MG TOTAL) BY MOUTH EVERY MORNING. 90 tablet 3   bisoprolol (ZEBETA) 5 MG tablet TAKE 1 TABLET BY MOUTH ONCE DAILY. 90 tablet 3   spironolactone (ALDACTONE) 25 MG tablet TAKE 1 TABLET (25 MG TOTAL) BY MOUTH DAILY. 90 tablet 3   methylPREDNISolone (MEDROL)  4 MG TBPK tablet Take as directed. 21 tablet 0   No facility-administered medications prior to visit.     Per HPI unless specifically indicated in ROS section below Review of Systems  Constitutional:  Negative for activity change, appetite change, chills, fatigue, fever and unexpected weight change.  HENT:  Negative for hearing loss.   Eyes:  Negative for visual disturbance.  Respiratory:  Negative for cough, chest tightness, shortness of breath and wheezing.   Cardiovascular:  Negative for chest pain, palpitations and leg swelling.  Gastrointestinal:  Negative for abdominal distention, abdominal pain, blood in stool, constipation, diarrhea, nausea and vomiting.  Genitourinary:  Negative  for difficulty urinating and hematuria.  Musculoskeletal:  Negative for arthralgias, myalgias and neck pain.  Skin:  Negative for rash.  Neurological:  Negative for dizziness, seizures, syncope and headaches.  Hematological:  Negative for adenopathy. Does not bruise/bleed easily.  Psychiatric/Behavioral:  Negative for dysphoric mood. The patient is not nervous/anxious.    Objective:  BP 120/72   Pulse 62   Temp (!) 97.5 F (36.4 C) (Temporal)   Ht 5' 2.25" (1.581 m)   Wt 142 lb 7 oz (64.6 kg)   SpO2 97%   BMI 25.84 kg/m   Wt Readings from Last 3 Encounters:  01/08/21 142 lb 7 oz (64.6 kg)  12/27/20 143 lb 8 oz (65.1 kg)  12/13/20 143 lb (64.9 kg)      Physical Exam Vitals and nursing note reviewed.  Constitutional:      Appearance: Normal appearance. She is not ill-appearing.  HENT:     Head: Normocephalic and atraumatic.     Right Ear: Tympanic membrane, ear canal and external ear normal. There is no impacted cerumen.     Left Ear: Tympanic membrane, ear canal and external ear normal. There is no impacted cerumen.  Eyes:     General:        Right eye: No discharge.        Left eye: No discharge.     Extraocular Movements: Extraocular movements intact.     Conjunctiva/sclera: Conjunctivae normal.     Pupils: Pupils are equal, round, and reactive to light.  Neck:     Thyroid: No thyroid mass or thyromegaly.     Vascular: No carotid bruit.  Cardiovascular:     Rate and Rhythm: Normal rate and regular rhythm.     Pulses: Normal pulses.     Heart sounds: Normal heart sounds. No murmur heard. Pulmonary:     Effort: Pulmonary effort is normal. No respiratory distress.     Breath sounds: Normal breath sounds. No wheezing, rhonchi or rales.  Abdominal:     General: Bowel sounds are normal. There is no distension.     Palpations: Abdomen is soft. There is no mass.     Tenderness: There is no abdominal tenderness. There is no guarding or rebound.     Hernia: No hernia is  present.  Musculoskeletal:     Cervical back: Normal range of motion and neck supple. No rigidity.     Right lower leg: No edema.     Left lower leg: No edema.  Lymphadenopathy:     Cervical: No cervical adenopathy.  Skin:    General: Skin is warm and dry.     Findings: No rash.  Neurological:     General: No focal deficit present.     Mental Status: She is alert. Mental status is at baseline.  Psychiatric:  Mood and Affect: Mood normal.        Behavior: Behavior normal.      Results for orders placed or performed in visit on 01/06/21  TSH  Result Value Ref Range   TSH 1.79 0.35 - 5.50 uIU/mL  VITAMIN D 25 Hydroxy (Vit-D Deficiency, Fractures)  Result Value Ref Range   VITD 39.09 30.00 - 100.00 ng/mL  Comprehensive metabolic panel  Result Value Ref Range   Sodium 136 135 - 145 mEq/L   Potassium 4.7 3.5 - 5.1 mEq/L   Chloride 105 96 - 112 mEq/L   CO2 25 19 - 32 mEq/L   Glucose, Bld 114 (H) 70 - 99 mg/dL   BUN 25 (H) 6 - 23 mg/dL   Creatinine, Ser 1.13 0.40 - 1.20 mg/dL   Total Bilirubin 0.3 0.2 - 1.2 mg/dL   Alkaline Phosphatase 45 39 - 117 U/L   AST 12 0 - 37 U/L   ALT 8 0 - 35 U/L   Total Protein 7.3 6.0 - 8.3 g/dL   Albumin 4.1 3.5 - 5.2 g/dL   GFR 51.64 (L) >60.00 mL/min   Calcium 11.5 (H) 8.4 - 10.5 mg/dL  Lipid panel  Result Value Ref Range   Cholesterol 161 0 - 200 mg/dL   Triglycerides 58.0 0.0 - 149.0 mg/dL   HDL 49.60 >39.00 mg/dL   VLDL 11.6 0.0 - 40.0 mg/dL   LDL Cholesterol 99 0 - 99 mg/dL   Total CHOL/HDL Ratio 3    NonHDL 111.07     Assessment & Plan:  This visit occurred during the SARS-CoV-2 public health emergency.  Safety protocols were in place, including screening questions prior to the visit, additional usage of staff PPE, and extensive cleaning of exam room while observing appropriate contact time as indicated for disinfecting solutions.   Problem List Items Addressed This Visit     Vitamin D deficiency    Continue vit D 1000 IU  daily.        HYPERCHOLESTEROLEMIA    Chronic, stable. Continue current regimen.  The 10-year ASCVD risk score Mikey Bussing DC Brooke Bonito., et al., 2013) is: 5.9%   Values used to calculate the score:     Age: 85 years     Sex: Female     Is Non-Hispanic African American: Yes     Diabetic: No     Tobacco smoker: No     Systolic Blood Pressure: 123456 mmHg     Is BP treated: Yes     HDL Cholesterol: 49.6 mg/dL     Total Cholesterol: 161 mg/dL        Relevant Medications   atorvastatin (LIPITOR) 20 MG tablet   bisoprolol (ZEBETA) 5 MG tablet   spironolactone (ALDACTONE) 25 MG tablet   amLODipine-benazepril (LOTREL) 5-20 MG capsule   Essential hypertension    Chronic, stable on lotrel, spironolactone, bisoprolol.  Progressive renal insufficiency noted today - see below.  Plastic surgeon wants her off amlodipine prior to surgery planned for December. Will drop lotrel to 5/'20mg'$  daily to start.  Will ask her to start monitoring BP more regularly, drop off log card at upcoming lab visit (provided today).       Relevant Medications   atorvastatin (LIPITOR) 20 MG tablet   bisoprolol (ZEBETA) 5 MG tablet   spironolactone (ALDACTONE) 25 MG tablet   amLODipine-benazepril (LOTREL) 5-20 MG capsule   Health maintenance examination - Primary    Preventative protocols reviewed and updated unless pt declined. Discussed healthy diet and  lifestyle.        Surgical menopause on hormone replacement therapy    Continues HRT QOD. Planned gyn f/u /well woman exam next month.       Renal insufficiency    Progressive renal insufficiency noted over the past few years with latest GFR below 60 and hypercalcemia. Will recommend increased water intake, already limits NSAIDs and other nephrotoxic agents, RTC 1-2 wks lab visit for further evaluation. UA today.        Relevant Orders   Renal function panel   CBC with Differential/Platelet   Parathyroid hormone, intact (no Ca)   Aldosterone + renin activity w/  ratio   Hypercalcemia    Stop supplemental calcium, increased dietary calcium intake.        Relevant Orders   Parathyroid hormone, intact (no Ca)     Meds ordered this encounter  Medications   DISCONTD: amLODipine-benazepril (LOTREL) 10-20 MG capsule    Sig: Take 1 capsule by mouth daily.    Dispense:  90 capsule    Refill:  3   atorvastatin (LIPITOR) 20 MG tablet    Sig: Take 1 tablet (20 mg total) by mouth every morning.    Dispense:  90 tablet    Refill:  3   bisoprolol (ZEBETA) 5 MG tablet    Sig: Take 1 tablet (5 mg total) by mouth daily.    Dispense:  90 tablet    Refill:  3   spironolactone (ALDACTONE) 25 MG tablet    Sig: Take 1 tablet (25 mg total) by mouth daily.    Dispense:  90 tablet    Refill:  3   amLODipine-benazepril (LOTREL) 5-20 MG capsule    Sig: Take 1 capsule by mouth daily.    Dispense:  90 capsule    Refill:  3    Use this dose 5/'20mg'$    Orders Placed This Encounter  Procedures   Renal function panel    Standing Status:   Future    Standing Expiration Date:   01/08/2022   CBC with Differential/Platelet    Standing Status:   Future    Standing Expiration Date:   01/08/2022   Parathyroid hormone, intact (no Ca)    Standing Status:   Future    Standing Expiration Date:   01/08/2022   Aldosterone + renin activity w/ ratio    Standing Status:   Future    Standing Expiration Date:   01/08/2022    Patient instructions: Urinalysis today  Return in 1-2 weeks for lab visit only to recheck kidneys.  Drop amlodipine dose in lotrel to '5mg'$  daily - so you'll be taking amlodipine/benazepril 5/'20mg'$  daily. This will start slow taper off amlodipine.  Hold calcium tablet, increase calcium in diet.  Return in 1 month for blood pressure and kidney follow up visit and preoperative evaluation.   Follow up plan: Return in about 2 months (around 03/11/2021), or if symptoms worsen or fail to improve, for follow up visit.  Ria Bush, MD

## 2021-01-08 NOTE — Patient Instructions (Addendum)
Urinalysis today  Return in 1-2 weeks for lab visit only to recheck kidneys.  Drop amlodipine dose in lotrel to '5mg'$  daily - so you'll be taking amlodipine/benazepril 5/'20mg'$  daily. This will start slow taper off amlodipine.  Hold calcium tablet, increase calcium in diet.  Return in 1 month for blood pressure and kidney follow up visit and preoperative evaluation.   Health Maintenance for Postmenopausal Women Menopause is a normal process in which your ability to get pregnant comes to an end. This process happens slowly over many months or years, usually between the ages of 51 and 20. Menopause is complete when you have missed your menstrualperiods for 12 months. It is important to talk with your health care provider about some of the most common conditions that affect women after menopause (postmenopausal women). These include heart disease, cancer, and bone loss (osteoporosis). Adopting a healthy lifestyle and getting preventive care can help to promote your health and wellness. The actions you take can also lower your chances ofdeveloping some of these common conditions. What should I know about menopause? During menopause, you may get a number of symptoms, such as: Hot flashes. These can be moderate or severe. Night sweats. Decrease in sex drive. Mood swings. Headaches. Tiredness. Irritability. Memory problems. Insomnia. Choosing to treat or not to treat these symptoms is a decision that you makewith your health care provider. Do I need hormone replacement therapy? Hormone replacement therapy is effective in treating symptoms that are caused by menopause, such as hot flashes and night sweats. Hormone replacement carries certain risks, especially as you become older. If you are thinking about using estrogen or estrogen with progestin, discuss the benefits and risks with your health care provider. What is my risk for heart disease and stroke? The risk of heart disease, heart attack, and  stroke increases as you age. One of the causes may be a change in the body's hormones during menopause. This can affect how your body uses dietary fats, triglycerides, and cholesterol. Heart attack and stroke are medical emergencies. There are many things that you cando to help prevent heart disease and stroke. Watch your blood pressure High blood pressure causes heart disease and increases the risk of stroke. This is more likely to develop in people who have high blood pressure readings, are of African descent, or are overweight. Have your blood pressure checked: Every 3-5 years if you are 79-48 years of age. Every year if you are 53 years old or older. Eat a healthy diet  Eat a diet that includes plenty of vegetables, fruits, low-fat dairy products, and lean protein. Do not eat a lot of foods that are high in solid fats, added sugars, or sodium.  Get regular exercise Get regular exercise. This is one of the most important things you can do for your health. Most adults should: Try to exercise for at least 150 minutes each week. The exercise should increase your heart rate and make you sweat (moderate-intensity exercise). Try to do strengthening exercises at least twice each week. Do these in addition to the moderate-intensity exercise. Spend less time sitting. Even light physical activity can be beneficial. Other tips Work with your health care provider to achieve or maintain a healthy weight. Do not use any products that contain nicotine or tobacco, such as cigarettes, e-cigarettes, and chewing tobacco. If you need help quitting, ask your health care provider. Know your numbers. Ask your health care provider to check your cholesterol and your blood sugar (glucose). Continue to have  your blood tested as directed by your health care provider. Do I need screening for cancer? Depending on your health history and family history, you may need to have cancer screening at different stages of your  life. This may include screening for: Breast cancer. Cervical cancer. Lung cancer. Colorectal cancer. What is my risk for osteoporosis? After menopause, you may be at increased risk for osteoporosis. Osteoporosis is a condition in which bone destruction happens more quickly than new bone creation. To help prevent osteoporosis or the bone fractures that can happen because of osteoporosis, you may take the following actions: If you are 81-71 years old, get at least 1,000 mg of calcium and at least 600 mg of vitamin D per day. If you are older than age 27 but younger than age 55, get at least 1,200 mg of calcium and at least 600 mg of vitamin D per day. If you are older than age 69, get at least 1,200 mg of calcium and at least 800 mg of vitamin D per day. Smoking and drinking excessive alcohol increase the risk of osteoporosis. Eat foods that are rich in calcium and vitamin D, and do weight-bearing exercisesseveral times each week as directed by your health care provider. How does menopause affect my mental health? Depression may occur at any age, but it is more common as you become older. Common symptoms of depression include: Low or sad mood. Changes in sleep patterns. Changes in appetite or eating patterns. Feeling an overall lack of motivation or enjoyment of activities that you previously enjoyed. Frequent crying spells. Talk with your health care provider if you think that you are experiencingdepression. General instructions See your health care provider for regular wellness exams and vaccines. This may include: Scheduling regular health, dental, and eye exams. Getting and maintaining your vaccines. These include: Influenza vaccine. Get this vaccine each year before the flu season begins. Pneumonia vaccine. Shingles vaccine. Tetanus, diphtheria, and pertussis (Tdap) booster vaccine. Your health care provider may also recommend other immunizations. Tell your health care provider if you  have ever been abused or do not feel safeat home. Summary Menopause is a normal process in which your ability to get pregnant comes to an end. This condition causes hot flashes, night sweats, decreased interest in sex, mood swings, headaches, or lack of sleep. Treatment for this condition may include hormone replacement therapy. Take actions to keep yourself healthy, including exercising regularly, eating a healthy diet, watching your weight, and checking your blood pressure and blood sugar levels. Get screened for cancer and depression. Make sure that you are up to date with all your vaccines. This information is not intended to replace advice given to you by your health care provider. Make sure you discuss any questions you have with your healthcare provider. Document Revised: 05/25/2018 Document Reviewed: 05/25/2018 Elsevier Patient Education  2022 [employer].

## 2021-01-08 NOTE — Assessment & Plan Note (Signed)
Continues HRT QOD. Planned gyn f/u /well woman exam next month.

## 2021-01-08 NOTE — Addendum Note (Signed)
Addended by: Brenton Grills on: [id number] AM   Modules accepted: Orders

## 2021-01-08 NOTE — Assessment & Plan Note (Signed)
Continue vit D 1000 IU daily. 

## 2021-01-08 NOTE — Assessment & Plan Note (Signed)
Preventative protocols reviewed and updated unless pt declined. Discussed healthy diet and lifestyle.  

## 2021-01-08 NOTE — Assessment & Plan Note (Signed)
Stop supplemental calcium, increased dietary calcium intake.

## 2021-01-09 ENCOUNTER — Other Ambulatory Visit (HOSPITAL_COMMUNITY): Payer: Self-pay

## 2021-01-16 ENCOUNTER — Other Ambulatory Visit (INDEPENDENT_AMBULATORY_CARE_PROVIDER_SITE_OTHER): Payer: 59

## 2021-01-16 ENCOUNTER — Other Ambulatory Visit: Payer: Self-pay

## 2021-01-16 DIAGNOSIS — N289 Disorder of kidney and ureter, unspecified: Secondary | ICD-10-CM

## 2021-01-16 LAB — RENAL FUNCTION PANEL
Albumin: 3.8 g/dL (ref 3.5–5.2)
BUN: 15 mg/dL (ref 6–23)
CO2: 22 mEq/L (ref 19–32)
Calcium: 9.6 mg/dL (ref 8.4–10.5)
Chloride: 107 mEq/L (ref 96–112)
Creatinine, Ser: 1.12 mg/dL (ref 0.40–1.20)
GFR: 52.18 mL/min — ABNORMAL LOW (ref >60.00)
Glucose, Bld: 88 mg/dL (ref 70–99)
Phosphorus: 2.5 mg/dL (ref 2.3–4.6)
Potassium: 4 mEq/L (ref 3.5–5.1)
Sodium: 137 mEq/L (ref 135–145)

## 2021-01-16 LAB — CBC WITH DIFFERENTIAL/PLATELET
Basophils Absolute: 0 10*3/uL (ref 0.0–0.1)
Basophils Relative: 0.7 % (ref 0.0–3.0)
Eosinophils Absolute: 0.1 10*3/uL (ref 0.0–0.7)
Eosinophils Relative: 2.2 % (ref 0.0–5.0)
HCT: 35.1 % — ABNORMAL LOW (ref 36.0–46.0)
Hemoglobin: 11.9 g/dL — ABNORMAL LOW (ref 12.0–15.0)
Lymphocytes Relative: 34.3 % (ref 12.0–46.0)
Lymphs Abs: 1.6 10*3/uL (ref 0.7–4.0)
MCHC: 33.9 g/dL (ref 30.0–36.0)
MCV: 87.4 fl (ref 78.0–100.0)
Monocytes Absolute: 0.6 10*3/uL (ref 0.1–1.0)
Monocytes Relative: 12.3 % — ABNORMAL HIGH (ref 3.0–12.0)
Neutro Abs: 2.4 10*3/uL (ref 1.4–7.7)
Neutrophils Relative %: 50.5 % (ref 43.0–77.0)
Platelets: 269 10*3/uL (ref 150.0–400.0)
RBC: 4.02 Mil/uL (ref 3.87–5.11)
RDW: 15.3 % (ref 11.5–15.5)
WBC: 4.7 10*3/uL (ref 4.0–10.5)

## 2021-01-17 LAB — HEMOGLOBIN A1C
Estimated Avg Glucose, External: 129 mg/dL — ABNORMAL HIGH (ref 91–123)
Hemoglobin A1C, External: 6.1 % — ABNORMAL HIGH (ref 4.8–5.6)

## 2021-01-17 LAB — PARATHYROID HORMONE, INTACT (NO CA): PTH: 75 pg/mL (ref 16–77)

## 2021-01-22 LAB — ALDOSTERONE + RENIN ACTIVITY W/ RATIO
ALDO / PRA Ratio: 5 Ratio (ref 0.9–28.9)
Aldosterone: 11 ng/dL
Renin Activity: 2.22 ng/mL/h (ref 0.25–5.82)

## 2021-01-30 ENCOUNTER — Ambulatory Visit: Payer: 59 | Admitting: Physician Assistant

## 2021-02-05 ENCOUNTER — Other Ambulatory Visit (HOSPITAL_COMMUNITY): Payer: Self-pay

## 2021-02-05 ENCOUNTER — Encounter: Payer: Self-pay | Admitting: Family Medicine

## 2021-02-05 ENCOUNTER — Ambulatory Visit (INDEPENDENT_AMBULATORY_CARE_PROVIDER_SITE_OTHER): Payer: 59 | Admitting: Family Medicine

## 2021-02-05 ENCOUNTER — Other Ambulatory Visit: Payer: Self-pay

## 2021-02-05 VITALS — BP 132/84 | HR 60 | Ht 66.0 in | Wt 142.6 lb

## 2021-02-05 DIAGNOSIS — U071 COVID-19: Secondary | ICD-10-CM | POA: Insufficient documentation

## 2021-02-05 DIAGNOSIS — E8940 Asymptomatic postprocedural ovarian failure: Secondary | ICD-10-CM

## 2021-02-05 DIAGNOSIS — Z79890 Hormone replacement therapy (postmenopausal): Secondary | ICD-10-CM | POA: Diagnosis not present

## 2021-02-05 DIAGNOSIS — Z01419 Encounter for gynecological examination (general) (routine) without abnormal findings: Secondary | ICD-10-CM | POA: Diagnosis not present

## 2021-02-05 MED ORDER — ESTRADIOL 2 MG PO TABS
2.0000 mg | ORAL_TABLET | Freq: Every day | ORAL | 4 refills | Status: DC
  Filled 2021-02-05 – 2021-09-16 (×2): qty 90, 90d supply, fill #0

## 2021-02-05 NOTE — Progress Notes (Signed)
   GYNECOLOGY CARE ENCOUNTER NOTE  Subjective:   Jacqueline Waters is a 65 y.o. [id number] female here for a routine annual gynecologic exam.  Current complaints: None.   Denies abnormal vaginal bleeding, discharge, pelvic pain, problems with intercourse or other gynecologic concerns.    Gynecologic History No LMP recorded. Patient has had a hysterectomy. Contraception: status post hysterectomy Last Pap: NA- stop screening Last mammogram: June 2022. Results were: normal  Health Maintenance Due  Topic Date Due   HIV Screening  Never done   COVID-19 Vaccine (4 - Booster for Moderna series) 08/27/2020   INFLUENZA VACCINE  01/13/2021    The following portions of the patient's history were reviewed and updated as appropriate: allergies, current medications, past family history, past medical history, past social history, past surgical history and problem list.  Review of Systems Pertinent items are noted in HPI.   Objective:  BP 132/84   Pulse 60   Ht '5\' 6"'$  (1.676 m)   Wt 142 lb 9.6 oz (64.7 kg)   BMI 23.02 kg/m  CONSTITUTIONAL: Well-developed, well-nourished female in no acute distress.  HENT:  Normocephalic, atraumatic Oropharynx is clear and moist EYES:  No scleral icterus.  NECK: Normal range of motion, supple, no masses.  Normal thyroid.  SKIN: Skin is warm and dry. No rash noted. Not diaphoretic. No erythema. No pallor. NEUROLOGIC: Alert and oriented to person, place, and time.  PSYCHIATRIC: Normal mood and affect. Normal behavior. Normal judgment and thought content. CARDIOVASCULAR: Normal heart rate noted RESPIRATORY: Effort and breath sounds normal, no problems with respiration noted. BREASTS: declined exam ABDOMEN: Soft. No tenderness, rebound or guarding.  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 normal appearing vaginal mucosa and cervix.  No abnormal discharge noted. Surgically absent uterus.  MUSCULOSKELETAL: Normal range of motion.      Assessment and Plan:  1) Annual gynecologic examination - pap not indicated:   1. Well woman exam with routine gynecological exam Declined breast exam Mammogram WNL Pap not indicated  2. Surgical menopause on hormone replacement therapy Tried to stop but had rebound hot flashes. Will continued. Discussed risk/benefits and patient would like to continue. Will have discussion at 66 about tapering off and do joint decision making yearly to evaluate need for medication.  - Currently taking every other day but will provide rx for daily if needed.  - estradiol (ESTRACE) 2 MG tablet; Take 1 tablet (2 mg total) by mouth daily.  Dispense: 90 tablet; Refill: 4     Please refer to After Visit Summary for other counseling recommendations.   Return for Yearly wellness exam.  Caren Macadam, MD, MPH, ABFM Attending Physician Center for [facility]

## 2021-02-05 NOTE — Assessment & Plan Note (Signed)
COVID19  Infection < 5 days from onset of symptoms in  triple vaccinated overweight individual with no history of chronic lung disease.   No clear sign of bacterial infection at this time.   No SOB.  No red flags/need for ER visit or in-person exam at respiratory clinic at this time..    Pt higher risk for COVID complications given  age GFR  >60 and no medication contraindications.  Start paxlovid 5 day course. Reviewed course of medication and side effect profile with patient in detail.   Symptomatic care with mucinex and cough suppressant at night. If SOB begins symptoms worsening.. have low threshold for in-person exam, if severe shortness of breath ER visit recommended.  Can monitor Oxygen saturation at home with home monitor if able to obtain.  Go to ER if O2 sat < 90% on room air.   Reviewed home care and provided information through [facility].  Recommended quarantine 5 days isolation recommended. Return to work day 6 and wear mask for 4 more days to complete 10 days. Provided info about prevention of spread of COVID 19.

## 2021-02-10 ENCOUNTER — Other Ambulatory Visit (HOSPITAL_COMMUNITY): Payer: Self-pay

## 2021-02-10 ENCOUNTER — Encounter: Payer: Self-pay | Admitting: Family Medicine

## 2021-02-10 ENCOUNTER — Other Ambulatory Visit: Payer: Self-pay

## 2021-02-10 ENCOUNTER — Ambulatory Visit: Payer: 59 | Admitting: Family Medicine

## 2021-02-10 VITALS — BP 132/70 | HR 68 | Temp 97.8°F | Ht 62.25 in | Wt 144.4 lb

## 2021-02-10 DIAGNOSIS — N289 Disorder of kidney and ureter, unspecified: Secondary | ICD-10-CM

## 2021-02-10 DIAGNOSIS — I10 Essential (primary) hypertension: Secondary | ICD-10-CM | POA: Diagnosis not present

## 2021-02-10 DIAGNOSIS — Z01818 Encounter for other preprocedural examination: Secondary | ICD-10-CM | POA: Diagnosis not present

## 2021-02-10 MED ORDER — BENAZEPRIL HCL 20 MG PO TABS
20.0000 mg | ORAL_TABLET | Freq: Every day | ORAL | 6 refills | Status: DC
  Filled 2021-02-10: qty 30, 30d supply, fill #0
  Filled 2021-03-12: qty 30, 30d supply, fill #1
  Filled 2021-04-14: qty 30, 30d supply, fill #2
  Filled 2021-05-15: qty 90, 90d supply, fill #3

## 2021-02-10 NOTE — Patient Instructions (Addendum)
EKG today Go to [facility] imaging [address] for chest xray.  Schedule lab visit in 1-2 weeks after starting plain benazepril.  Good to see you today.

## 2021-02-10 NOTE — Progress Notes (Signed)
Patient ID: Jacqueline Waters, female    DOB: [birth year], 65 y.o.   MRN: [id number]  This visit was conducted in person.  BP 132/70   Pulse 68   Temp 97.8 F (36.6 C) (Temporal)   Ht 5' 2.25" (1.581 m)   Wt 144 lb 7 oz (65.5 kg)   SpO2 99%   BMI 26.21 kg/m    CC: HTN f/u visit  Subjective:   HPI: Jacqueline Waters is a 65 y.o. female presenting on 02/10/2021 for Hypertension (Here for 1 mo f/u.  Pt brought in home BP to compare.  Reading in office today, 133/74.  Also, provided copy [to keep] of recent BP readings. ) and Pre-op Exam (Breast reduction surgery TBD. )   Preop evaluation for planned breast reduction surgery date TBD. Planned general anesthesia. Was advised to stop amlodipine preoperatively due to increased bleeding risk.   Has previously tolerated GETA (hysterectomy > 20 yrs ago) without difficulty awakening or post-op nausea/vomiting.   Denies recent fevers/chills, cough, chest pain/tightness, dyspnea, headaches, dizziness, leg swelling.   HTN - Compliant with current antihypertensive regimen of lotrel 5/20, spironolactone '25mg'$  daily, and bisoprolol '5mg'$  daily. Does check blood pressures at home and brings log - largely well controlled 110-120s/70-80s, HR 50-60s. No low blood pressure readings or symptoms of dizziness/syncope. Denies HA, vision changes, CP/tightness, SOB, leg swelling. She has increased water intake.      Relevant past medical, surgical, family and social history reviewed and updated as indicated. Interim medical history since our last visit reviewed. Allergies and medications reviewed and updated. Outpatient Medications Prior to Visit  Medication Sig Dispense Refill   acetaminophen (TYLENOL) 500 MG tablet Take 500 mg by mouth daily as needed.     atorvastatin (LIPITOR) 20 MG tablet Take 1 tablet (20 mg total) by mouth every morning. 90 tablet 3   bisoprolol (ZEBETA) 5 MG tablet Take 1 tablet (5 mg total) by mouth daily. 90 tablet 3   Cholecalciferol  (VITAMIN D3) 25 MCG (1000 UT) CAPS Take 1 capsule by mouth daily.     diclofenac Sodium (VOLTAREN) 1 % GEL Apply 2 g topically 4 (four) times daily. 100 g 2   estradiol (ESTRACE) 2 MG tablet Take 1 tablet (2 mg total) by mouth daily. 90 tablet 4   ibuprofen (ADVIL) 200 MG tablet Take 200 mg by mouth every 6 (six) hours as needed.     methocarbamol (ROBAXIN) 500 MG tablet TAKE 1 TABLET (500 MG TOTAL) BY MOUTH AT BEDTIME. 30 tablet 1   Multiple Vitamins-Minerals (CENTRUM PO) Take 1 tablet by mouth daily.     Probiotic Product (PROBIOTIC DAILY PO) Take by mouth.     spironolactone (ALDACTONE) 25 MG tablet Take 1 tablet (25 mg total) by mouth daily. (Patient taking differently: Take 25 mg by mouth daily. Pt takes half a pill) 90 tablet 3   amLODipine-benazepril (LOTREL) 5-20 MG capsule Take 1 capsule by mouth daily. (Patient taking differently: Take 1 capsule by mouth daily. '5mg'$ ) 90 capsule 3   No facility-administered medications prior to visit.     Per HPI unless specifically indicated in ROS section below Review of Systems  Objective:  BP 132/70   Pulse 68   Temp 97.8 F (36.6 C) (Temporal)   Ht 5' 2.25" (1.581 m)   Wt 144 lb 7 oz (65.5 kg)   SpO2 99%   BMI 26.21 kg/m   Wt Readings from Last 3 Encounters:  02/10/21 144 lb 7  oz (65.5 kg)  02/05/21 142 lb 9.6 oz (64.7 kg)  01/08/21 142 lb 7 oz (64.6 kg)      Physical Exam Vitals and nursing note reviewed.  Constitutional:      Appearance: Normal appearance. She is not ill-appearing.  Cardiovascular:     Rate and Rhythm: Normal rate and regular rhythm.     Pulses: Normal pulses.     Heart sounds: Normal heart sounds. No murmur heard. Pulmonary:     Effort: Pulmonary effort is normal. No respiratory distress.     Breath sounds: Normal breath sounds. No wheezing, rhonchi or rales.  Musculoskeletal:     Right lower leg: No edema.     Left lower leg: No edema.     Comments: 2+ DP bilaterally  Skin:    General: Skin is warm and  dry.     Findings: No rash.  Neurological:     Mental Status: She is alert.  Psychiatric:        Mood and Affect: Mood normal.        Behavior: Behavior normal.      EKG - NSR rate 70s, normal axis, intervals, no hypertrophy or acute ST/T changes  Assessment & Plan:  This visit occurred during the SARS-CoV-2 public health emergency.  Safety protocols were in place, including screening questions prior to the visit, additional usage of staff PPE, and extensive cleaning of exam room while observing appropriate contact time as indicated for disinfecting solutions.   Problem List Items Addressed This Visit     Essential hypertension    Chronic, remains well controlled as we've been tapering amlodipine dose - will fully stop now in preparation for upcoming planned surgery, continue only benazepril '20mg'$  daily, spironolactone '25mg'$  daily, and bisoprolol '5mg'$  daily. She will monitor BP closely at home, RTC 2 wks lab visit, RTC 1 mo f/u HTN if needed.       Relevant Medications   benazepril (LOTENSIN) 20 MG tablet   Renal insufficiency    Update renal panel 1-2 wks after stopping amlodipine. ?component of over-treatment vs hypertensive nephropathy.       Relevant Orders   Renal function panel   CBC with Differential/Platelet   Pre-op evaluation - Primary    RCRI = 0 EKG today.  She will go to [facility] for CXR and return in 1-2 wks for lab visit only.  Anticipate adequately low risk to proceed with planned surgery.       Relevant Orders   EKG 12-Lead (Completed)   DG Chest 2 View   Renal function panel   CBC with Differential/Platelet   Protime-INR     Meds ordered this encounter  Medications   benazepril (LOTENSIN) 20 MG tablet    Sig: Take 1 tablet (20 mg total) by mouth daily.    Dispense:  30 tablet    Refill:  6    To replace lotrel   Orders Placed This Encounter  Procedures   DG Chest 2 View    Standing Status:   Future    Standing Expiration Date:    02/10/2022    Order Specific Question:   Reason for Exam (SYMPTOM  OR DIAGNOSIS REQUIRED)    Answer:   preop    Order Specific Question:   Preferred imaging location?    Answer:   [id number]   Renal function panel    Standing Status:   Future    Standing Expiration Date:   02/10/2022   CBC with  Differential/Platelet    Standing Status:   Future    Standing Expiration Date:   02/10/2022   Protime-INR    Standing Status:   Future    Standing Expiration Date:   02/10/2022   EKG 12-Lead    Patient Instructions  EKG today Go to [facility] imaging [place] for chest xray.  Schedule lab visit in 1-2 weeks after starting plain benazepril.  Good to see you today.   Follow up plan: Return if symptoms worsen or fail to improve.  Ria Bush, MD

## 2021-02-10 NOTE — Assessment & Plan Note (Signed)
Update renal panel 1-2 wks after stopping amlodipine. ?component of over-treatment vs hypertensive nephropathy.

## 2021-02-10 NOTE — Assessment & Plan Note (Signed)
Chronic, remains well controlled as we've been tapering amlodipine dose - will fully stop now in preparation for upcoming planned surgery, continue only benazepril '20mg'$  daily, spironolactone '25mg'$  daily, and bisoprolol '5mg'$  daily. She will monitor BP closely at home, RTC 2 wks lab visit, RTC 1 mo f/u HTN if needed.

## 2021-02-10 NOTE — Assessment & Plan Note (Signed)
RCRI = 0 EKG today.  She will go to [place] for CXR and return in 1-2 wks for lab visit only.  Anticipate adequately low risk to proceed with planned surgery.

## 2021-02-20 ENCOUNTER — Other Ambulatory Visit (INDEPENDENT_AMBULATORY_CARE_PROVIDER_SITE_OTHER): Payer: 59

## 2021-02-20 ENCOUNTER — Other Ambulatory Visit: Payer: Self-pay

## 2021-02-20 DIAGNOSIS — Z01818 Encounter for other preprocedural examination: Secondary | ICD-10-CM

## 2021-02-20 DIAGNOSIS — N289 Disorder of kidney and ureter, unspecified: Secondary | ICD-10-CM | POA: Diagnosis not present

## 2021-02-20 LAB — CBC WITH DIFFERENTIAL/PLATELET
Basophils Absolute: 0 10*3/uL (ref 0.0–0.1)
Basophils Relative: 0.6 % (ref 0.0–3.0)
Eosinophils Absolute: 0.1 10*3/uL (ref 0.0–0.7)
Eosinophils Relative: 2.4 % (ref 0.0–5.0)
HCT: 39 % (ref 36.0–46.0)
Hemoglobin: 13.1 g/dL (ref 12.0–15.0)
Lymphocytes Relative: 43.1 % (ref 12.0–46.0)
Lymphs Abs: 1.7 10*3/uL (ref 0.7–4.0)
MCHC: 33.5 g/dL (ref 30.0–36.0)
MCV: 88.5 fl (ref 78.0–100.0)
Monocytes Absolute: 0.5 10*3/uL (ref 0.1–1.0)
Monocytes Relative: 12.9 % — ABNORMAL HIGH (ref 3.0–12.0)
Neutro Abs: 1.6 10*3/uL (ref 1.4–7.7)
Neutrophils Relative %: 41 % — ABNORMAL LOW (ref 43.0–77.0)
Platelets: 259 10*3/uL (ref 150.0–400.0)
RBC: 4.41 Mil/uL (ref 3.87–5.11)
RDW: 14.9 % (ref 11.5–15.5)
WBC: 4 10*3/uL (ref 4.0–10.5)

## 2021-02-20 LAB — RENAL FUNCTION PANEL
Albumin: 4.1 g/dL (ref 3.5–5.2)
BUN: 14 mg/dL (ref 6–23)
CO2: 27 mEq/L (ref 19–32)
Calcium: 10.1 mg/dL (ref 8.4–10.5)
Chloride: 107 mEq/L (ref 96–112)
Creatinine, Ser: 1.08 mg/dL (ref 0.40–1.20)
GFR: 54.47 mL/min — ABNORMAL LOW (ref >60.00)
Glucose, Bld: 85 mg/dL (ref 70–99)
Phosphorus: 2.7 mg/dL (ref 2.3–4.6)
Potassium: 3.9 mEq/L (ref 3.5–5.1)
Sodium: 139 mEq/L (ref 135–145)

## 2021-02-20 LAB — PROTIME-INR
INR: 1 ratio (ref 0.8–1.0)
Prothrombin Time: 11.3 s (ref 9.6–13.1)

## 2021-02-24 ENCOUNTER — Other Ambulatory Visit: Payer: 59

## 2021-02-25 ENCOUNTER — Other Ambulatory Visit: Payer: Self-pay

## 2021-02-25 ENCOUNTER — Ambulatory Visit
Admission: RE | Admit: 2021-02-25 | Discharge: 2021-02-25 | Disposition: A | Discharge status: Home / Self Care | Payer: 59 | Source: Ambulatory Visit | Attending: Family Medicine | Admitting: Family Medicine

## 2021-02-25 DIAGNOSIS — Z01818 Encounter for other preprocedural examination: Secondary | ICD-10-CM

## 2021-02-25 DIAGNOSIS — Z539 Procedure and treatment not carried out, unspecified reason: Secondary | ICD-10-CM | POA: Diagnosis not present

## 2021-03-11 ENCOUNTER — Other Ambulatory Visit (HOSPITAL_COMMUNITY): Payer: Self-pay

## 2021-03-11 ENCOUNTER — Encounter: Payer: Self-pay | Admitting: Family Medicine

## 2021-03-11 ENCOUNTER — Other Ambulatory Visit: Payer: Self-pay

## 2021-03-11 ENCOUNTER — Ambulatory Visit: Payer: 59 | Admitting: Family Medicine

## 2021-03-11 VITALS — BP 117/78 | HR 56 | Temp 97.6°F | Ht 62.5 in | Wt 143.5 lb

## 2021-03-11 DIAGNOSIS — N289 Disorder of kidney and ureter, unspecified: Secondary | ICD-10-CM

## 2021-03-11 DIAGNOSIS — I10 Essential (primary) hypertension: Secondary | ICD-10-CM

## 2021-03-11 MED ORDER — SPIRONOLACTONE 25 MG PO TABS
12.5000 mg | ORAL_TABLET | Freq: Every day | ORAL | 3 refills | Status: DC
  Filled 2021-03-11: qty 45, 90d supply, fill #0
  Filled 2021-04-14 – 2021-05-05 (×3): qty 45, 90d supply, fill #1

## 2021-03-11 NOTE — Progress Notes (Signed)
Patient ID: Jacqueline Waters, female    DOB: [birth year], 65 y.o.   MRN: [id number]  This visit was conducted in person.  BP 117/78 Comment: home readings recently  Pulse (!) 56   Temp 97.6 F (36.4 C) (Temporal)   Ht 5' 2.5" (1.588 m)   Wt 143 lb 8 oz (65.1 kg)   SpO2 99%   BMI 25.83 kg/m   BP elevated in office today - see below.  Home readings 110s/70s.   CC: 1 mo f/u visit  Subjective:   HPI: Jacqueline Waters is a 65 y.o. female presenting on 03/11/2021 for Hypertension (Here for 2 mo f/u.  States she did not take BP meds yesterday. )   Upcoming breast reduction surgery date TBD. Planning under GETA. We stopped amlodipine per plastic surgeon request due to increased bleeding risk.   Home BP cuff was previously compared to our cuff - comparable readings.   HTN - Compliant with current antihypertensive regimen of benazepril 20mg  daily, bisoprolol 5mg  daily, spironolactone 12.5mg  daily. However yesterday did not take blood pressure medications - forgot as she was in [place] caring for grandchildren. She is also stressed over sister's illness (terminal metastatic breast cancer). Does check blood pressures at home: left bp log at home - but overall 110s/70s. No low blood pressure readings or symptoms of dizziness/syncope. Denies HA, vision changes, CP/tightness, SOB, leg swelling.   Renal insufficiency - kidney function remaining stable over the past 2 months through recent antihypertensive changes. Newly noted kidney impairment over the past few years.      Relevant past medical, surgical, family and social history reviewed and updated as indicated. Interim medical history since our last visit reviewed. Allergies and medications reviewed and updated. Outpatient Medications Prior to Visit  Medication Sig Dispense Refill   acetaminophen (TYLENOL) 500 MG tablet Take 500 mg by mouth daily as needed.     atorvastatin (LIPITOR) 20 MG tablet Take 1 tablet (20 mg total) by mouth every  morning. 90 tablet 3   benazepril (LOTENSIN) 20 MG tablet Take 1 tablet (20 mg total) by mouth daily. 30 tablet 6   bisoprolol (ZEBETA) 5 MG tablet Take 1 tablet (5 mg total) by mouth daily. 90 tablet 3   Cholecalciferol (VITAMIN D3) 25 MCG (1000 UT) CAPS Take 1 capsule by mouth daily.     diclofenac Sodium (VOLTAREN) 1 % GEL Apply 2 g topically 4 (four) times daily. 100 g 2   estradiol (ESTRACE) 2 MG tablet Take 1 tablet (2 mg total) by mouth daily. 90 tablet 4   ibuprofen (ADVIL) 200 MG tablet Take 200 mg by mouth every 6 (six) hours as needed.     methocarbamol (ROBAXIN) 500 MG tablet TAKE 1 TABLET (500 MG TOTAL) BY MOUTH AT BEDTIME. 30 tablet 1   Multiple Vitamins-Minerals (CENTRUM PO) Take 1 tablet by mouth daily.     Probiotic Product (PROBIOTIC DAILY PO) Take by mouth.     spironolactone (ALDACTONE) 25 MG tablet Take 1 tablet (25 mg total) by mouth daily. (Patient taking differently: Take 25 mg by mouth daily. Pt takes half a pill) 90 tablet 3   No facility-administered medications prior to visit.     Per HPI unless specifically indicated in ROS section below Review of Systems  Objective:  BP 117/78 Comment: home readings recently  Pulse (!) 56   Temp 97.6 F (36.4 C) (Temporal)   Ht 5' 2.5" (1.588 m)   Wt 143 lb 8 oz (  65.1 kg)   SpO2 99%   BMI 25.83 kg/m   Wt Readings from Last 3 Encounters:  03/11/21 143 lb 8 oz (65.1 kg)  02/10/21 144 lb 7 oz (65.5 kg)  02/05/21 142 lb 9.6 oz (64.7 kg)      Physical Exam Vitals and nursing note reviewed.  Constitutional:      Appearance: Normal appearance. She is not ill-appearing.  Eyes:     Extraocular Movements: Extraocular movements intact.     Pupils: Pupils are equal, round, and reactive to light.  Neck:     Thyroid: No thyroid mass or thyromegaly.  Cardiovascular:     Rate and Rhythm: Normal rate and regular rhythm.     Pulses: Normal pulses.     Heart sounds: Normal heart sounds. No murmur heard. Pulmonary:      Effort: Pulmonary effort is normal. No respiratory distress.     Breath sounds: Normal breath sounds. No wheezing, rhonchi or rales.  Musculoskeletal:     Right lower leg: No edema.     Left lower leg: No edema.  Skin:    General: Skin is warm and dry.     Findings: No rash.  Neurological:     Mental Status: She is alert.  Psychiatric:        Mood and Affect: Mood normal.        Behavior: Behavior normal.      Results for orders placed or performed in visit on 02/20/21  Protime-INR  Result Value Ref Range   INR 1.0 0.8 - 1.0 ratio   Prothrombin Time 11.3 9.6 - 13.1 sec  CBC with Differential/Platelet  Result Value Ref Range   WBC 4.0 4.0 - 10.5 K/uL   RBC 4.41 3.87 - 5.11 Mil/uL   Hemoglobin 13.1 12.0 - 15.0 g/dL   HCT 39.0 36.0 - 46.0 %   MCV 88.5 78.0 - 100.0 fl   MCHC 33.5 30.0 - 36.0 g/dL   RDW 14.9 11.5 - 15.5 %   Platelets 259.0 150.0 - 400.0 K/uL   Neutrophils Relative % 41.0 (L) 43.0 - 77.0 %   Lymphocytes Relative 43.1 12.0 - 46.0 %   Monocytes Relative 12.9 (H) 3.0 - 12.0 %   Eosinophils Relative 2.4 0.0 - 5.0 %   Basophils Relative 0.6 0.0 - 3.0 %   Neutro Abs 1.6 1.4 - 7.7 K/uL   Lymphs Abs 1.7 0.7 - 4.0 K/uL   Monocytes Absolute 0.5 0.1 - 1.0 K/uL   Eosinophils Absolute 0.1 0.0 - 0.7 K/uL   Basophils Absolute 0.0 0.0 - 0.1 K/uL  Renal function panel  Result Value Ref Range   Sodium 139 135 - 145 mEq/L   Potassium 3.9 3.5 - 5.1 mEq/L   Chloride 107 96 - 112 mEq/L   CO2 27 19 - 32 mEq/L   Albumin 4.1 3.5 - 5.2 g/dL   BUN 14 6 - 23 mg/dL   Creatinine, Ser 1.08 0.40 - 1.20 mg/dL   Glucose, Bld 85 70 - 99 mg/dL   Phosphorus 2.7 2.3 - 4.6 mg/dL   GFR 54.47 (L) >60.00 mL/min   Calcium 10.1 8.4 - 10.5 mg/dL   DG Chest 2 View CLINICAL DATA:  65 year old female with a history pending surgery  EXAM: CHEST - 2 VIEW  COMPARISON:  07/27/2013  FINDINGS: Cardiomediastinal silhouette unchanged in size and contour. No evidence of central vascular congestion.  No interlobular septal thickening.  No pneumothorax or pleural effusion. Coarsened interstitial markings, with no confluent  airspace disease.  No acute displaced fracture. Degenerative changes of the spine.  IMPRESSION: No active cardiopulmonary disease.  Electronically Signed   By: Corrie Mckusick D.O.   On: 02/25/2021 11:19   Assessment & Plan:  This visit occurred during the SARS-CoV-2 public health emergency.  Safety protocols were in place, including screening questions prior to the visit, additional usage of staff PPE, and extensive cleaning of exam room while observing appropriate contact time as indicated for disinfecting solutions.   Problem List Items Addressed This Visit     Essential hypertension - Primary    Chronic, great control based on home readings however BP elevated in office today, attributable to missed antihypertensive medications yesterday while caring for grandchildren as well as due to family illness stressors.  Still anticipate ready to proceed with planned surgery given normal home readings. I did ask her to monitor blood pressures at home and send me log of readings over the next week. Pt agrees with plan.       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 Rpt labs at next OV.         Meds ordered this encounter  Medications   spironolactone (ALDACTONE) 25 MG tablet    Sig: Take 0.5 tablets (12.5 mg total) by mouth daily.    Dispense:  45 tablet    Refill:  3    Use this sig   No orders of the defined types were placed in this encounter.   Patient Instructions  Blood pressure was elevated in office today, however home readings have been very well controlled.  Continue monitoring blood pressures at home, send me log via mychart next week to ensure readings staying adequately controlled prior to upcoming  surgery.  Return in January for blood pressure and kidney follow up visit.   Follow up plan: Return in about 4 months (around 07/11/2021), or if symptoms worsen or fail to improve, for follow up visit.  Ria Bush, MD

## 2021-03-11 NOTE — Assessment & Plan Note (Signed)
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 Rpt labs at next OV.

## 2021-03-11 NOTE — Patient Instructions (Addendum)
Blood pressure was elevated in office today, however home readings have been very well controlled.  Continue monitoring blood pressures at home, send me log via mychart next week to ensure readings staying adequately controlled prior to upcoming surgery.  Return in January for blood pressure and kidney follow up visit.

## 2021-03-11 NOTE — Assessment & Plan Note (Addendum)
Chronic, great control based on home readings however BP elevated in office today, attributable to missed antihypertensive medications yesterday while caring for grandchildren as well as due to family illness stressors.  Still anticipate ready to proceed with planned surgery given normal home readings. I did ask her to monitor blood pressures at home and send me log of readings over the next week. Pt agrees with plan.

## 2021-03-12 ENCOUNTER — Other Ambulatory Visit (HOSPITAL_COMMUNITY): Payer: Self-pay

## 2021-03-24 ENCOUNTER — Telehealth: Payer: Self-pay | Admitting: Family Medicine

## 2021-03-24 NOTE — Telephone Encounter (Signed)
Called Jacqueline Waters and she stated that she is having a breast reduction in December and the doctor wants her most recent labs and wanted to know if she needed to get them done with Dr. Darnell Level or what to do.

## 2021-03-24 NOTE — Telephone Encounter (Signed)
Pt is wanting to schedule a lab visit, I did not see any orders. Please call pt to schedule.

## 2021-03-25 NOTE — Telephone Encounter (Signed)
Spoke with pt about lab request.  States she wants results of latest labs mailed to her to take with her to doctor's appt.    Printed and mailed 02/20/21 lab results.

## 2021-03-25 NOTE — Telephone Encounter (Signed)
Pt asked for a call back, [phone]. Please advise

## 2021-03-25 NOTE — Telephone Encounter (Signed)
Lvm asking pt to call back.  Need to find out who we send lab results to.

## 2021-03-25 NOTE — Telephone Encounter (Signed)
Returning phone call from [facility]

## 2021-04-04 ENCOUNTER — Encounter: Payer: Self-pay | Admitting: Family Medicine

## 2021-04-04 DIAGNOSIS — Z01818 Encounter for other preprocedural examination: Secondary | ICD-10-CM

## 2021-04-14 ENCOUNTER — Other Ambulatory Visit (HOSPITAL_COMMUNITY): Payer: Self-pay

## 2021-04-14 MED FILL — Methocarbamol Tab 500 MG: ORAL | 30 days supply | Qty: 30 | Fill #0 | Status: AC

## 2021-04-15 ENCOUNTER — Telehealth: Payer: Self-pay | Admitting: Family Medicine

## 2021-04-15 ENCOUNTER — Other Ambulatory Visit (INDEPENDENT_AMBULATORY_CARE_PROVIDER_SITE_OTHER): Payer: 59

## 2021-04-15 ENCOUNTER — Other Ambulatory Visit: Payer: Self-pay

## 2021-04-15 DIAGNOSIS — Z01818 Encounter for other preprocedural examination: Secondary | ICD-10-CM

## 2021-04-15 NOTE — Telephone Encounter (Signed)
Ok to do these. Thank you.

## 2021-04-15 NOTE — Addendum Note (Signed)
Addended by: Patrcia Dolly on: 04/15/2021 04:36 PM   Modules accepted: Orders

## 2021-04-15 NOTE — Telephone Encounter (Signed)
Patient was in lab today 04/15/2021 for lab work. Patient stated she does not need PT,CBCD,CMP, the test were cancelled. The test the Patient stated she need was CBC and BMP.

## 2021-04-17 ENCOUNTER — Other Ambulatory Visit (INDEPENDENT_AMBULATORY_CARE_PROVIDER_SITE_OTHER): Payer: 59

## 2021-04-17 ENCOUNTER — Other Ambulatory Visit: Payer: Self-pay

## 2021-04-17 DIAGNOSIS — Z01818 Encounter for other preprocedural examination: Secondary | ICD-10-CM | POA: Diagnosis not present

## 2021-04-17 LAB — BASIC METABOLIC PANEL
BUN: 14 mg/dL (ref 6–23)
CO2: 27 mEq/L (ref 19–32)
Calcium: 10.3 mg/dL (ref 8.4–10.5)
Chloride: 106 mEq/L (ref 96–112)
Creatinine, Ser: 1.01 mg/dL (ref 0.40–1.20)
GFR: 58.97 mL/min — ABNORMAL LOW (ref >60.00)
Glucose, Bld: 119 mg/dL — ABNORMAL HIGH (ref 70–99)
Potassium: 4.2 mEq/L (ref 3.5–5.1)
Sodium: 138 mEq/L (ref 135–145)

## 2021-04-17 LAB — CBC WITH DIFFERENTIAL/PLATELET
Basophils Absolute: 0 10*3/uL (ref 0.0–0.1)
Basophils Relative: 0.4 % (ref 0.0–3.0)
Eosinophils Absolute: 0.1 10*3/uL (ref 0.0–0.7)
Eosinophils Relative: 2.4 % (ref 0.0–5.0)
HCT: 39.3 % (ref 36.0–46.0)
Hemoglobin: 13.2 g/dL (ref 12.0–15.0)
Lymphocytes Relative: 38.3 % (ref 12.0–46.0)
Lymphs Abs: 1.4 10*3/uL (ref 0.7–4.0)
MCHC: 33.6 g/dL (ref 30.0–36.0)
MCV: 87.4 fl (ref 78.0–100.0)
Monocytes Absolute: 0.3 10*3/uL (ref 0.1–1.0)
Monocytes Relative: 9.2 % (ref 3.0–12.0)
Neutro Abs: 1.8 10*3/uL (ref 1.4–7.7)
Neutrophils Relative %: 49.7 % (ref 43.0–77.0)
Platelets: 275 10*3/uL (ref 150.0–400.0)
RBC: 4.49 Mil/uL (ref 3.87–5.11)
RDW: 13.9 % (ref 11.5–15.5)
WBC: 3.5 10*3/uL — ABNORMAL LOW (ref 4.0–10.5)

## 2021-04-30 ENCOUNTER — Other Ambulatory Visit (HOSPITAL_COMMUNITY): Payer: Self-pay

## 2021-04-30 MED ORDER — ONDANSETRON 4 MG PO TBDP
4.0000 mg | ORAL_TABLET | Freq: Three times a day (TID) | ORAL | 0 refills | Status: DC | PRN
  Filled 2021-04-30: qty 20, 7d supply, fill #0

## 2021-04-30 MED ORDER — SULFAMETHOXAZOLE-TRIMETHOPRIM 800-160 MG PO TABS
1.0000 | ORAL_TABLET | Freq: Two times a day (BID) | ORAL | 0 refills | Status: DC
Start: 2021-04-30 — End: 2021-06-11
  Filled 2021-04-30: qty 14, 7d supply, fill #0

## 2021-04-30 MED ORDER — HYDROCODONE-ACETAMINOPHEN 5-325 MG PO TABS
1.0000 | ORAL_TABLET | Freq: Four times a day (QID) | ORAL | 0 refills | Status: DC | PRN
  Filled 2021-04-30: qty 20, 5d supply, fill #0

## 2021-05-05 ENCOUNTER — Other Ambulatory Visit (HOSPITAL_COMMUNITY): Payer: Self-pay

## 2021-05-15 ENCOUNTER — Other Ambulatory Visit (HOSPITAL_COMMUNITY): Payer: Self-pay

## 2021-05-15 HISTORY — PX: REDUCTION MAMMAPLASTY: SUR839

## 2021-06-11 ENCOUNTER — Encounter: Payer: Self-pay | Admitting: Family Medicine

## 2021-06-11 ENCOUNTER — Other Ambulatory Visit: Payer: Self-pay

## 2021-06-11 ENCOUNTER — Other Ambulatory Visit (HOSPITAL_COMMUNITY): Payer: Self-pay

## 2021-06-11 ENCOUNTER — Ambulatory Visit: Payer: 59 | Admitting: Family Medicine

## 2021-06-11 VITALS — BP 140/82 | HR 52 | Temp 97.8°F | Ht 62.5 in | Wt 141.1 lb

## 2021-06-11 DIAGNOSIS — I10 Essential (primary) hypertension: Secondary | ICD-10-CM

## 2021-06-11 DIAGNOSIS — N289 Disorder of kidney and ureter, unspecified: Secondary | ICD-10-CM

## 2021-06-11 MED ORDER — BENAZEPRIL HCL 20 MG PO TABS
20.0000 mg | ORAL_TABLET | Freq: Every day | ORAL | 1 refills | Status: DC
Start: 2021-06-11 — End: 2022-01-12
  Filled 2021-06-11 – 2021-08-17 (×2): qty 90, 90d supply, fill #0
  Filled 2021-11-22: qty 90, 90d supply, fill #1

## 2021-06-11 NOTE — Assessment & Plan Note (Signed)
Chronic, today in office mildly elevated readings however home readings largely well controlled. No changes made today. Continue 3 drug regimen.

## 2021-06-11 NOTE — Patient Instructions (Addendum)
Schedule lab visit in 2-3 months  Return for physical at end of July.  Good to see you today! I'm glad you're recovering well.

## 2021-06-11 NOTE — Assessment & Plan Note (Signed)
Latest GFR improved to 58, will recheck with lab visit only in 2-3 months. Encouraged ongoing good water intake - she has improved with this.

## 2021-06-11 NOTE — Progress Notes (Signed)
Patient ID: Jacqueline Waters, female    DOB: [birth year], 65 y.o.   MRN: [id number]  This visit was conducted in person.  BP 140/82    Pulse (!) 52    Temp 97.8 F (36.6 C) (Temporal)    Ht 5' 2.5" (1.588 m)    Wt 141 lb 1 oz (64 kg)    SpO2 98%    BMI 25.39 kg/m   BP Readings from Last 3 Encounters:  06/11/21 140/82  03/11/21 117/78  02/10/21 132/70  140s/80s on repeat   CC: 74mo HTN f/u visit  Subjective:   HPI: Jacqueline Waters is a 65 y.o. female presenting on 06/11/2021 for Hypertension (Here for 3 mo f/u.)   Recent breast reduction surgery earlier this month.   HTN - Compliant with current antihypertensive regimen of benazepril 20mg  daily, bisoprolol 5mg  daily, spironolactone 12.5mg  daily. Does check blood pressures at home: 017P systolic, diastolics <10. No low blood pressure readings or symptoms of dizziness/syncope. Denies HA, vision changes, CP/tightness, SOB, leg swelling.   Renal insufficiency - drinking plenty of water.   She had some difficulty waking up from surgery and had low heart rates.      Relevant past medical, surgical, family and social history reviewed and updated as indicated. Interim medical history since our last visit reviewed. Allergies and medications reviewed and updated. Outpatient Medications Prior to Visit  Medication Sig Dispense Refill   acetaminophen (TYLENOL) 500 MG tablet Take 500 mg by mouth daily as needed.     atorvastatin (LIPITOR) 20 MG tablet Take 1 tablet (20 mg total) by mouth every morning. 90 tablet 3   bisoprolol (ZEBETA) 5 MG tablet Take 1 tablet (5 mg total) by mouth daily. 90 tablet 3   Cholecalciferol (VITAMIN D3) 25 MCG (1000 UT) CAPS Take 1 capsule by mouth daily.     diclofenac Sodium (VOLTAREN) 1 % GEL Apply 2 g topically 4 (four) times daily. 100 g 2   estradiol (ESTRACE) 2 MG tablet Take 1 tablet (2 mg total) by mouth daily. 90 tablet 4   ibuprofen (ADVIL) 200 MG tablet Take 200 mg by mouth every 6 (six) hours as  needed.     methocarbamol (ROBAXIN) 500 MG tablet Take 1 tablet (500 mg total) by mouth at bedtime. 30 tablet 1   Multiple Vitamins-Minerals (CENTRUM PO) Take 1 tablet by mouth daily.     Probiotic Product (PROBIOTIC DAILY PO) Take by mouth.     spironolactone (ALDACTONE) 25 MG tablet Take 0.5 tablets (12.5 mg total) by mouth daily. 45 tablet 3   benazepril (LOTENSIN) 20 MG tablet Take 1 tablet (20 mg total) by mouth daily. 30 tablet 6   HYDROcodone-acetaminophen (NORCO/VICODIN) 5-325 MG tablet Take 1 tablet by mouth every 6 (six) hours as needed up to 5 days. 20 tablet 0   ondansetron (ZOFRAN-ODT) 4 MG disintegrating tablet Dissolve 1 tablet (4 mg total) by mouth every 8 (eight) hours as needed for Nausea. 20 tablet 0   sulfamethoxazole-trimethoprim (BACTRIM DS) 800-160 MG tablet Take 1 tablet by mouth 2 (two) times daily. 14 tablet 0   No facility-administered medications prior to visit.     Per HPI unless specifically indicated in ROS section below Review of Systems  Objective:  BP 140/82    Pulse (!) 52    Temp 97.8 F (36.6 C) (Temporal)    Ht 5' 2.5" (1.588 m)    Wt 141 lb 1 oz (64 kg)    SpO2  98%    BMI 25.39 kg/m   Wt Readings from Last 3 Encounters:  06/11/21 141 lb 1 oz (64 kg)  03/11/21 143 lb 8 oz (65.1 kg)  02/10/21 144 lb 7 oz (65.5 kg)      Physical Exam Vitals and nursing note reviewed.  Constitutional:      Appearance: Normal appearance. She is not ill-appearing.  Cardiovascular:     Rate and Rhythm: Normal rate and regular rhythm.     Pulses: Normal pulses.     Heart sounds: Normal heart sounds. No murmur heard. Pulmonary:     Effort: Pulmonary effort is normal. No respiratory distress.     Breath sounds: Normal breath sounds. No wheezing, rhonchi or rales.  Musculoskeletal:     Right lower leg: No edema.     Left lower leg: No edema.  Neurological:     Mental Status: She is alert.  Psychiatric:        Mood and Affect: Mood normal.        Behavior:  Behavior normal.      Results for orders placed or performed in visit on 90/24/09  Basic metabolic panel  Result Value Ref Range   Sodium 138 135 - 145 mEq/L   Potassium 4.2 3.5 - 5.1 mEq/L   Chloride 106 96 - 112 mEq/L   CO2 27 19 - 32 mEq/L   Glucose, Bld 119 (H) 70 - 99 mg/dL   BUN 14 6 - 23 mg/dL   Creatinine, Ser 1.01 0.40 - 1.20 mg/dL   GFR 58.97 (L) >60.00 mL/min   Calcium 10.3 8.4 - 10.5 mg/dL  CBC with Differential/Platelet  Result Value Ref Range   WBC 3.5 (L) 4.0 - 10.5 K/uL   RBC 4.49 3.87 - 5.11 Mil/uL   Hemoglobin 13.2 12.0 - 15.0 g/dL   HCT 39.3 36.0 - 46.0 %   MCV 87.4 78.0 - 100.0 fl   MCHC 33.6 30.0 - 36.0 g/dL   RDW 13.9 11.5 - 15.5 %   Platelets 275.0 150.0 - 400.0 K/uL   Neutrophils Relative % 49.7 43.0 - 77.0 %   Lymphocytes Relative 38.3 12.0 - 46.0 %   Monocytes Relative 9.2 3.0 - 12.0 %   Eosinophils Relative 2.4 0.0 - 5.0 %   Basophils Relative 0.4 0.0 - 3.0 %   Neutro Abs 1.8 1.4 - 7.7 K/uL   Lymphs Abs 1.4 0.7 - 4.0 K/uL   Monocytes Absolute 0.3 0.1 - 1.0 K/uL   Eosinophils Absolute 0.1 0.0 - 0.7 K/uL   Basophils Absolute 0.0 0.0 - 0.1 K/uL    Assessment & Plan:  This visit occurred during the SARS-CoV-2 public health emergency.  Safety protocols were in place, including screening questions prior to the visit, additional usage of staff PPE, and extensive cleaning of exam room while observing appropriate contact time as indicated for disinfecting solutions.   Problem List Items Addressed This Visit     Essential hypertension - Primary    Chronic, today in office mildly elevated readings however home readings largely well controlled. No changes made today. Continue 3 drug regimen.       Relevant Medications   benazepril (LOTENSIN) 20 MG tablet   Renal insufficiency    Latest GFR improved to 58, will recheck with lab visit only in 2-3 months. Encouraged ongoing good water intake - she has improved with this.       Relevant Orders   Renal  function panel   CBC with Differential/Platelet  Meds ordered this encounter  Medications   benazepril (LOTENSIN) 20 MG tablet    Sig: Take 1 tablet (20 mg total) by mouth daily.    Dispense:  90 tablet    Refill:  1   Orders Placed This Encounter  Procedures   Renal function panel    Standing Status:   Future    Standing Expiration Date:   06/11/2022   CBC with Differential/Platelet    Standing Status:   Future    Standing Expiration Date:   06/11/2022     Patient Instructions  Schedule lab visit in 2-3 months  Return for physical at end of July.  Good to see you today! I'm glad you're recovering well.   Follow up plan: Return in about 7 months (around 01/09/2022) for follow up visit.  Ria Bush, MD

## 2021-06-13 ENCOUNTER — Other Ambulatory Visit (HOSPITAL_COMMUNITY): Payer: Self-pay

## 2021-06-18 ENCOUNTER — Other Ambulatory Visit (HOSPITAL_COMMUNITY): Payer: Self-pay

## 2021-06-18 MED ORDER — SPIRONOLACTONE 25 MG PO TABS
25.0000 mg | ORAL_TABLET | Freq: Every day | ORAL | 3 refills | Status: DC
  Filled 2021-06-18 – 2021-07-15 (×2): qty 90, 90d supply, fill #0
  Filled 2021-10-21: qty 90, 90d supply, fill #1
  Filled 2021-10-27: qty 90, 90d supply, fill #2

## 2021-06-18 NOTE — Addendum Note (Signed)
Addended by: Ria Bush on: 06/18/2021 08:25 AM   Modules accepted: Orders

## 2021-07-02 ENCOUNTER — Encounter: Payer: Self-pay | Admitting: Family Medicine

## 2021-07-02 ENCOUNTER — Other Ambulatory Visit: Payer: Self-pay

## 2021-07-02 ENCOUNTER — Other Ambulatory Visit (INDEPENDENT_AMBULATORY_CARE_PROVIDER_SITE_OTHER): Payer: 59

## 2021-07-02 DIAGNOSIS — N289 Disorder of kidney and ureter, unspecified: Secondary | ICD-10-CM

## 2021-07-02 LAB — RENAL FUNCTION PANEL
Albumin: 4 g/dL (ref 3.5–5.2)
BUN: 14 mg/dL (ref 6–23)
CO2: 29 mEq/L (ref 19–32)
Calcium: 10.4 mg/dL (ref 8.4–10.5)
Chloride: 104 mEq/L (ref 96–112)
Creatinine, Ser: 0.95 mg/dL (ref 0.40–1.20)
GFR: 63.37 mL/min (ref >60.00)
Glucose, Bld: 84 mg/dL (ref 70–99)
Phosphorus: 2.9 mg/dL (ref 2.3–4.6)
Potassium: 4 mEq/L (ref 3.5–5.1)
Sodium: 138 mEq/L (ref 135–145)

## 2021-07-02 LAB — CBC WITH DIFFERENTIAL/PLATELET
Basophils Absolute: 0 10*3/uL (ref 0.0–0.1)
Basophils Relative: 0.7 % (ref 0.0–3.0)
Eosinophils Absolute: 0 10*3/uL (ref 0.0–0.7)
Eosinophils Relative: 0.9 % (ref 0.0–5.0)
HCT: 40.1 % (ref 36.0–46.0)
Hemoglobin: 13.2 g/dL (ref 12.0–15.0)
Lymphocytes Relative: 36.3 % (ref 12.0–46.0)
Lymphs Abs: 1.5 10*3/uL (ref 0.7–4.0)
MCHC: 32.8 g/dL (ref 30.0–36.0)
MCV: 87.8 fl (ref 78.0–100.0)
Monocytes Absolute: 0.5 10*3/uL (ref 0.1–1.0)
Monocytes Relative: 11.1 % (ref 3.0–12.0)
Neutro Abs: 2.1 10*3/uL (ref 1.4–7.7)
Neutrophils Relative %: 51 % (ref 43.0–77.0)
Platelets: 223 10*3/uL (ref 150.0–400.0)
RBC: 4.57 Mil/uL (ref 3.87–5.11)
RDW: 15.3 % (ref 11.5–15.5)
WBC: 4.2 10*3/uL (ref 4.0–10.5)

## 2021-07-15 ENCOUNTER — Other Ambulatory Visit (HOSPITAL_COMMUNITY): Payer: Self-pay

## 2021-08-12 ENCOUNTER — Other Ambulatory Visit: Payer: 59

## 2021-08-14 ENCOUNTER — Encounter: Payer: Self-pay | Admitting: Internal Medicine

## 2021-08-14 ENCOUNTER — Telehealth (INDEPENDENT_AMBULATORY_CARE_PROVIDER_SITE_OTHER): Payer: 59 | Admitting: Internal Medicine

## 2021-08-14 ENCOUNTER — Other Ambulatory Visit: Payer: Self-pay

## 2021-08-14 DIAGNOSIS — J069 Acute upper respiratory infection, unspecified: Secondary | ICD-10-CM

## 2021-08-14 NOTE — Assessment & Plan Note (Signed)
Seems like typical viral infection ?Cough worsened with lozenges--so she has stopped these ?I recommended 2 ibuprofen or 2 tylenol every 4-6 hours for the sore throat (she hadn't tried this since she didn't have fever) ?Recommended flonase for now to hopefully keep her sinuses open ?If she worsens next week, could consider empiric Rx with antibiotic for possible secondary sinus infection ?

## 2021-08-14 NOTE — Progress Notes (Signed)
? ?Subjective:  ? ? Patient ID: Jacqueline Waters, female    DOB: 05-17-1956, 66 y.o.   MRN: [id number] ? ?HPI ?Video virtual visit due to respiratory symptoms ?Identification done ?Reviewed limitations and billing and she gave consent ?Participants--patient at her home and I am in my office ? ?Started with illness yesterday--visited grandkids last week and symptoms started shortly after that ?Some cough--that worsened ?Sore throat today ?Some sinus symptoms---blowing nose with some blood ?No fever or aches ?No SOB ?COVID test negative today ? ?Prone to sinus infections ?Does get sinus headaches with tree pollen--but usually doesn't take meds regularly ? ?Tried lozenges for sore throat--but it worsened cough (mucinex) ? ?Current Outpatient Medications on File Prior to Visit  ?Medication Sig Dispense Refill  ? acetaminophen (TYLENOL) 500 MG tablet Take 500 mg by mouth daily as needed.    ? atorvastatin (LIPITOR) 20 MG tablet Take 1 tablet (20 mg total) by mouth every morning. 90 tablet 3  ? benazepril (LOTENSIN) 20 MG tablet Take 1 tablet (20 mg total) by mouth daily. 90 tablet 1  ? bisoprolol (ZEBETA) 5 MG tablet Take 1 tablet (5 mg total) by mouth daily. 90 tablet 3  ? Cholecalciferol (VITAMIN D3) 25 MCG (1000 UT) CAPS Take 1 capsule by mouth daily.    ? diclofenac Sodium (VOLTAREN) 1 % GEL Apply 2 g topically 4 (four) times daily. 100 g 2  ? estradiol (ESTRACE) 2 MG tablet Take 1 tablet (2 mg total) by mouth daily. 90 tablet 4  ? ibuprofen (ADVIL) 200 MG tablet Take 200 mg by mouth every 6 (six) hours as needed.    ? methocarbamol (ROBAXIN) 500 MG tablet Take 1 tablet (500 mg total) by mouth at bedtime. 30 tablet 1  ? Multiple Vitamins-Minerals (CENTRUM PO) Take 1 tablet by mouth daily.    ? Probiotic Product (PROBIOTIC DAILY PO) Take by mouth.    ? spironolactone (ALDACTONE) 25 MG tablet Take 1 tablet (25 mg total) by mouth daily. 90 tablet 3  ? ?No current facility-administered medications on file prior to visit.   ? ? ?No Known Allergies ? ?Past Medical History:  ?Diagnosis Date  ? Breast cyst   ? Essential hypertension   ? HLD (hyperlipidemia)   ? Vaginal delivery   ? x3  ? VITAMIN D DEFICIENCY 07/16/2009  ? Qualifier: Diagnosis of  By: Royal Piedra NP, Tammy    ? ? ?Past Surgical History:  ?Procedure Laterality Date  ? ABDOMINAL HYSTERECTOMY    ? for uterine cyst, ovaries remain (Dr. Zigmund Daniel)  ? COLONOSCOPY  2010  ? WNL Olevia Perches)  ? COLONOSCOPY  03/2019  ? TA, HP, diverticulosis rpt 3 yrs ([facility])  ? CYSTECTOMY Left   ? ? ?Family History  ?Problem Relation Age of Onset  ? Hypertension Mother   ? CAD Mother 23  ?     s/p CABG  ? Stroke Mother   ? Colon polyps Mother   ? Hypertension Father   ? CAD Father 39  ?     deceased from MI  ? Stroke Father   ?     left paralysis  ? Breast cancer Half-Sister 83  ? Diabetes Half-Sister   ? Esophageal cancer Neg Hx   ? Rectal cancer Neg Hx   ? Stomach cancer Neg Hx   ? Colon cancer Neg Hx   ? ? ?Social History  ? ?Socioeconomic History  ? Marital status: Married  ?  Spouse name: Not on file  ? Number  of children: Not on file  ? Years of education: Not on file  ? Highest education level: Not on file  ?Occupational History  ? Not on file  ?Tobacco Use  ? Smoking status: Never  ? Smokeless tobacco: Never  ?Vaping Use  ? Vaping Use: Never used  ?Substance and Sexual Activity  ? Alcohol use: No  ?  Alcohol/week: 0.0 standard drinks  ? Drug use: No  ? Sexual activity: Yes  ?  Partners: Male  ?  Birth control/protection: Surgical  ?Other Topics Concern  ? Not on file  ?Social History Narrative  ? Lives with husband  ? Grown children  ? 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 Activity: walks regularly (walk/run program)  ? Diet: good water, fruits/vegetables daily  ? ?Social Determinants of Health  ? ?Financial Resource Strain: Not on file  ?Food Insecurity: Not on file  ?Transportation Needs: Not on file  ?Physical Activity: Not on file  ?Stress: Not on file  ?Social  Connections: Not on file  ?Intimate Partner Violence: Not on file  ? ?Review of Systems ?No N/V ?Appetite is okay---some pain with swallowing but able to  ?   ?Objective:  ? Physical Exam ?Constitutional:   ?   Appearance: Normal appearance.  ?Pulmonary:  ?   Effort: Pulmonary effort is normal. No respiratory distress.  ?Neurological:  ?   Mental Status: She is alert.  ?  ? ? ? ? ?   ?Assessment & Plan:  ? ?

## 2021-08-18 ENCOUNTER — Other Ambulatory Visit (HOSPITAL_COMMUNITY): Payer: Self-pay

## 2021-08-25 DIAGNOSIS — H524 Presbyopia: Secondary | ICD-10-CM | POA: Diagnosis not present

## 2021-08-25 DIAGNOSIS — H52203 Unspecified astigmatism, bilateral: Secondary | ICD-10-CM | POA: Diagnosis not present

## 2021-08-25 DIAGNOSIS — H5203 Hypermetropia, bilateral: Secondary | ICD-10-CM | POA: Diagnosis not present

## 2021-08-25 DIAGNOSIS — H25813 Combined forms of age-related cataract, bilateral: Secondary | ICD-10-CM | POA: Diagnosis not present

## 2021-09-09 ENCOUNTER — Other Ambulatory Visit: Payer: Self-pay | Admitting: Family Medicine

## 2021-09-09 DIAGNOSIS — Z1231 Encounter for screening mammogram for malignant neoplasm of breast: Secondary | ICD-10-CM

## 2021-09-17 ENCOUNTER — Other Ambulatory Visit (HOSPITAL_COMMUNITY): Payer: Self-pay

## 2021-10-22 ENCOUNTER — Other Ambulatory Visit (HOSPITAL_COMMUNITY): Payer: Self-pay

## 2021-10-23 ENCOUNTER — Encounter

## 2021-10-27 ENCOUNTER — Other Ambulatory Visit (HOSPITAL_COMMUNITY): Payer: Self-pay

## 2021-11-17 ENCOUNTER — Ambulatory Visit
Admission: RE | Admit: 2021-11-17 | Discharge: 2021-11-17 | Disposition: A | Discharge status: Home / Self Care | Payer: 59 | Source: Ambulatory Visit | Attending: Family Medicine | Admitting: Family Medicine

## 2021-11-17 DIAGNOSIS — Z1231 Encounter for screening mammogram for malignant neoplasm of breast: Secondary | ICD-10-CM

## 2021-11-24 ENCOUNTER — Other Ambulatory Visit (HOSPITAL_COMMUNITY): Payer: Self-pay

## 2021-11-25 ENCOUNTER — Inpatient Hospital Stay: Admit: 2021-11-25 | Payer: PRIVATE HEALTH INSURANCE | Primary: Family

## 2021-11-25 DIAGNOSIS — M25552 Pain in left hip: Secondary | ICD-10-CM

## 2021-12-04 ENCOUNTER — Encounter: Payer: Self-pay | Admitting: Family Medicine

## 2021-12-05 ENCOUNTER — Ambulatory Visit
Admit: 2021-12-05 | Discharge: 2021-12-05 | Payer: PRIVATE HEALTH INSURANCE | Attending: Orthopaedic Surgery | Primary: Family

## 2021-12-05 DIAGNOSIS — M1612 Unilateral primary osteoarthritis, left hip: Secondary | ICD-10-CM

## 2021-12-05 NOTE — Patient Instructions (Signed)
Hip Pain: Care Instructions  Overview     Hip pain may be caused by many things, including overuse, a fall, or a twisting movement. Another cause of hip pain is arthritis. Your pain may increase when you stand up, walk, or squat. The pain may come and go or may be constant.  Home treatment can help relieve hip pain, swelling, and stiffness. If your pain is ongoing, you may need more tests and treatment.  Follow-up care is a key part of your treatment and safety. Be sure to make and go to all appointments, and call your doctor if you are having problems. It's also a good idea to know your test results and keep a list of the medicines you take.  How can you care for yourself at home?  Take pain medicines exactly as directed.  If the doctor gave you a prescription medicine for pain, take it as prescribed.  If you are not taking a prescription pain medicine, ask your doctor if you can take an over-the-counter medicine.  Rest and protect your hip. Take a break from any activity, including standing or walking, that may cause pain.  Put ice or a cold pack against your hip for 10 to 20 minutes at a time. Try to do this every 1 to 2 hours for the next 3 days (when you are awake) or until the swelling goes down. Put a thin cloth between the ice and your skin.  Sleep on your healthy side with a pillow between your knees, or sleep on your back with pillows under your knees.  If there is no swelling, you can put moist heat, a heating pad, or a warm cloth on your hip. Do gentle stretching exercises to help keep your hip flexible.  Learn how to prevent falls. Have your vision and hearing checked regularly. Wear slippers or shoes with a nonskid sole.  Stay at a healthy weight.  Wear comfortable shoes.  When should you call for help?   Call 911 anytime you think you may need emergency care. For example, call if:    You have sudden chest pain and shortness of breath, or you cough up blood.     You are not able to stand or walk or  bear weight.     Your buttocks, legs, or feet feel numb or tingly.     Your leg or foot is cool or pale or changes color.     You have severe pain.   Call your doctor now or seek immediate medical care if:    You have signs of infection, such as:  Increased pain, swelling, warmth, or redness in the hip area.  Red streaks leading from the hip area.  Pus draining from the hip area.  A fever.     You have signs of a blood clot, such as:  Pain in your calf, back of the knee, thigh, or groin.  Redness and swelling in your leg or groin.     You are not able to bend, straighten, or move your leg normally.     You have trouble urinating or having bowel movements.   Watch closely for changes in your health, and be sure to contact your doctor if:    You do not get better as expected.   Where can you learn more?  Go to https://www.healthwise.net/patientEd and enter Z720 to learn more about "Hip Pain: Care Instructions."  Current as of: August 21, 2020                 Content Version: 13.5   2006-2022 [employer].   Care instructions adapted under license by [facility]. If you have questions about a medical condition or this instruction, always ask your healthcare professional. Healthwise, Inc[employer] disclaims any warranty or liability for your use of this information.

## 2021-12-05 NOTE — Progress Notes (Signed)
Name: Krista Fletcher    DOB: Oct 03, 1955     [facility]  [place] [facility] [address] [facility]  [address]  [place] [address]  Dept: [phone]  Dept Fax: [phone]     Chief Complaint   Patient presents with    Hip Pain        Ht 5' (1.524 m)   Wt 240 lb (108.9 kg)   BMI 46.87 kg/m      Allergies   Allergen Reactions    Sulfa Antibiotics         Current Outpatient Medications   Medication Sig Dispense Refill    amLODIPine (NORVASC) 10 MG tablet amlodipine 10 mg tablet      meloxicam (MOBIC) 15 MG tablet meloxicam 15 mg tablet      telmisartan-hydroCHLOROthiazide (MICARDIS HCT) 80-25 MG per tablet telmisartan 80 mg-hydrochlorothiazide 25 mg tablet      albuterol sulfate HFA (PROAIR HFA) 108 (90 Base) MCG/ACT inhaler ProAir HFA 90 mcg/actuation aerosol inhaler      brimonidine-timolol (COMBIGAN) 0.2-0.5 % ophthalmic solution Combigan 0.2 %-0.5 % eye drops      furosemide (LASIX) 20 MG tablet furosemide 20 mg tablet      latanoprost (XALATAN) 0.005 % ophthalmic solution latanoprost 0.005 % eye drops      lisinopril (PRINIVIL;ZESTRIL) 10 MG tablet lisinopril 10 mg tablet      prednisoLONE acetate (PRED FORTE) 1 % ophthalmic suspension prednisolone acetate 1 % eye drops,suspension       No current facility-administered medications for this visit.      There is no problem list on file for this patient.     Family History   Problem Relation Age of Onset    No Known Problems Mother     No Known Problems Father       Social History     Socioeconomic History    Marital status: Divorced     Spouse name: None    Number of children: None    Years of education: None    Highest education level: None   Tobacco Use    Smoking status: Never     Passive exposure: Never    Smokeless tobacco: Never   Vaping Use    Vaping Use: Never used   Substance and Sexual Activity    Alcohol use: Not Currently    Drug use: Never    Sexual activity: Not Currently      History reviewed.  No pertinent surgical history.   Past Medical History:   Diagnosis Date    Hypertension         I have reviewed and agree with PFSH and ROS and intake form in chart and the record furthermore I have reviewed prior medical record(s) regarding this patients care during this appointment.     Review of Systems:   Patient is a pleasant appearing individual, appropriately dressed, well hydrated, well nourished, who is alert, appropriately oriented for age, and in no acute distress with a normal gait and normal affect who does not appear to be in any significant pain.    Physical Exam:  Left Hip - Slight decrease range of motion, Positive crepitation, Grossly neurovascularly intact, Good cap refill, No skin lesion, mild swelling, No gross instability, Some weakness, Positive groin pain, No point tenderness on the greater trochanter.     Right Hip - Normal range of motion, No crepitation, Grossly neurovascularly intact, Good cap refill,  No skin lesion, mild swelling, No gross instability, No weakness, No groin pain, No point tenderness on the greater trochanter.    Visit Diagnoses         Codes    Primary osteoarthritis of left hip    -  Primary M16.12               HPI:  The patient is here with a chief complaint of left hip pain, throbbing burning pain.  Pain is 5/10.       X-rays of the left hip are positive for severe osteoarthritis.      Assessment/Plan:  Plan will be to get her to a hip specialist for evaluation for hip replacement and go from there.  If the patient gets worse, she is to give me call.  No restrictions in the meantime.         As part of continued conservative pain management options the patient was advised to utilize Tylenol or OTC NSAIDS as long as it is not medically contraindicated.     Return to Office:    Follow-up and Dispositions    Return if symptoms worsen or fail to improve.        Scribed by Diona Foley, LPN as dictated by [facility] A. Allena Katz, MD.  Documentation, performed by, True and  Accepted Domnick Chervenak A. Allena Katz, MD

## 2021-12-08 ENCOUNTER — Telehealth: Payer: Self-pay

## 2021-12-09 ENCOUNTER — Encounter: Payer: Self-pay | Admitting: Family

## 2021-12-09 ENCOUNTER — Ambulatory Visit: Payer: 59 | Admitting: Family

## 2021-12-09 ENCOUNTER — Other Ambulatory Visit (HOSPITAL_COMMUNITY): Payer: Self-pay

## 2021-12-09 ENCOUNTER — Ambulatory Visit (INDEPENDENT_AMBULATORY_CARE_PROVIDER_SITE_OTHER)
Admission: RE | Admit: 2021-12-09 | Discharge: 2021-12-09 | Disposition: A | Discharge status: Home / Self Care | Payer: 59 | Source: Ambulatory Visit | Attending: Family | Admitting: Family

## 2021-12-09 VITALS — BP 122/70 | HR 50 | Temp 98.4°F | Resp 16 | Ht 62.5 in | Wt 142.2 lb

## 2021-12-09 DIAGNOSIS — S199XXA Unspecified injury of neck, initial encounter: Secondary | ICD-10-CM

## 2021-12-09 DIAGNOSIS — M542 Cervicalgia: Secondary | ICD-10-CM | POA: Diagnosis not present

## 2021-12-09 DIAGNOSIS — F411 Generalized anxiety disorder: Secondary | ICD-10-CM | POA: Insufficient documentation

## 2021-12-09 DIAGNOSIS — S46812D Strain of other muscles, fascia and tendons at shoulder and upper arm level, left arm, subsequent encounter: Secondary | ICD-10-CM | POA: Insufficient documentation

## 2021-12-09 MED ORDER — ESCITALOPRAM OXALATE 10 MG PO TABS
10.0000 mg | ORAL_TABLET | Freq: Every day | ORAL | 0 refills | Status: DC
  Filled 2021-12-09: qty 90, 90d supply, fill #0

## 2021-12-09 NOTE — Telephone Encounter (Signed)
Spoke to patient and addressed her concerns. Made the patient an appointment with T. Dugal for 2:20pm today

## 2021-12-09 NOTE — Assessment & Plan Note (Signed)
D/w pt trial start lexapro 10 mg  I instructed pt to start 1/2 tablet once daily for 1 week and then increase to a full tablet once daily on week two as tolerated.  We discussed common side effects such as nausea, drowsiness and weight gain.  Also discussed rare but serious side effect of suicidal ideation.  She is instructed to discontinue medication and go directly to ED if this occurs.  Pt verbalizes understanding.  Plan is to follow up in 30 days to evaluate progress.    Work on anxiety reducing techniques Discussed therapy might be good option for traumatic event

## 2021-12-11 ENCOUNTER — Ambulatory Visit: Payer: 59 | Admitting: Family Medicine

## 2021-12-22 ENCOUNTER — Encounter: Payer: Self-pay | Admitting: Family Medicine

## 2021-12-22 ENCOUNTER — Other Ambulatory Visit (HOSPITAL_COMMUNITY): Payer: Self-pay

## 2021-12-22 MED ORDER — HYDROXYZINE HCL 25 MG PO TABS
12.5000 mg | ORAL_TABLET | Freq: Two times a day (BID) | ORAL | 0 refills | Status: DC | PRN
  Filled 2021-12-22: qty 30, 15d supply, fill #0

## 2021-12-23 ENCOUNTER — Other Ambulatory Visit (HOSPITAL_COMMUNITY): Payer: Self-pay

## 2022-01-04 ENCOUNTER — Other Ambulatory Visit: Payer: Self-pay | Admitting: Family Medicine

## 2022-01-04 DIAGNOSIS — E7800 Pure hypercholesterolemia, unspecified: Secondary | ICD-10-CM

## 2022-01-04 DIAGNOSIS — E559 Vitamin D deficiency, unspecified: Secondary | ICD-10-CM

## 2022-01-04 DIAGNOSIS — N289 Disorder of kidney and ureter, unspecified: Secondary | ICD-10-CM

## 2022-01-05 ENCOUNTER — Other Ambulatory Visit (INDEPENDENT_AMBULATORY_CARE_PROVIDER_SITE_OTHER): Payer: 59

## 2022-01-05 DIAGNOSIS — N289 Disorder of kidney and ureter, unspecified: Secondary | ICD-10-CM | POA: Diagnosis not present

## 2022-01-05 DIAGNOSIS — E559 Vitamin D deficiency, unspecified: Secondary | ICD-10-CM | POA: Diagnosis not present

## 2022-01-05 DIAGNOSIS — E7800 Pure hypercholesterolemia, unspecified: Secondary | ICD-10-CM | POA: Diagnosis not present

## 2022-01-05 LAB — COMPREHENSIVE METABOLIC PANEL
ALT: 16 U/L (ref 0–35)
AST: 18 U/L (ref 0–37)
Albumin: 4.2 g/dL (ref 3.5–5.2)
Alkaline Phosphatase: 51 U/L (ref 39–117)
BUN: 15 mg/dL (ref 6–23)
CO2: 26 mEq/L (ref 19–32)
Calcium: 10.1 mg/dL (ref 8.4–10.5)
Chloride: 106 mEq/L (ref 96–112)
Creatinine, Ser: 1.04 mg/dL (ref 0.40–1.20)
GFR: 56.65 mL/min — ABNORMAL LOW (ref >60.00)
Glucose, Bld: 90 mg/dL (ref 70–99)
Potassium: 4 mEq/L (ref 3.5–5.1)
Sodium: 137 mEq/L (ref 135–145)
Total Bilirubin: 0.4 mg/dL (ref 0.2–1.2)
Total Protein: 7.4 g/dL (ref 6.0–8.3)

## 2022-01-05 LAB — CBC WITH DIFFERENTIAL/PLATELET
Basophils Absolute: 0 10*3/uL (ref 0.0–0.1)
Basophils Relative: 0.8 % (ref 0.0–3.0)
Eosinophils Absolute: 0.1 10*3/uL (ref 0.0–0.7)
Eosinophils Relative: 2.9 % (ref 0.0–5.0)
HCT: 42.3 % (ref 36.0–46.0)
Hemoglobin: 14.2 g/dL (ref 12.0–15.0)
Lymphocytes Relative: 48 % — ABNORMAL HIGH (ref 12.0–46.0)
Lymphs Abs: 1.7 10*3/uL (ref 0.7–4.0)
MCHC: 33.6 g/dL (ref 30.0–36.0)
MCV: 88.7 fl (ref 78.0–100.0)
Monocytes Absolute: 0.5 10*3/uL (ref 0.1–1.0)
Monocytes Relative: 13.4 % — ABNORMAL HIGH (ref 3.0–12.0)
Neutro Abs: 1.3 10*3/uL — ABNORMAL LOW (ref 1.4–7.7)
Neutrophils Relative %: 34.9 % — ABNORMAL LOW (ref 43.0–77.0)
Platelets: 237 10*3/uL (ref 150.0–400.0)
RBC: 4.77 Mil/uL (ref 3.87–5.11)
RDW: 14.1 % (ref 11.5–15.5)
WBC: 3.6 10*3/uL — ABNORMAL LOW (ref 4.0–10.5)

## 2022-01-05 LAB — LIPID PANEL
Cholesterol: 142 mg/dL (ref 0–200)
HDL: 41.2 mg/dL (ref >39.00)
LDL Cholesterol: 84 mg/dL (ref 0–99)
NonHDL: 100.53
Total CHOL/HDL Ratio: 3
Triglycerides: 83 mg/dL (ref 0.0–149.0)
VLDL: 16.6 mg/dL (ref 0.0–40.0)

## 2022-01-05 LAB — VITAMIN D 25 HYDROXY (VIT D DEFICIENCY, FRACTURES): VITD: 41.61 ng/mL (ref 30.00–100.00)

## 2022-01-12 ENCOUNTER — Ambulatory Visit (INDEPENDENT_AMBULATORY_CARE_PROVIDER_SITE_OTHER): Payer: 59 | Admitting: Family Medicine

## 2022-01-12 ENCOUNTER — Encounter: Payer: Self-pay | Admitting: Family Medicine

## 2022-01-12 ENCOUNTER — Other Ambulatory Visit (HOSPITAL_COMMUNITY): Payer: Self-pay

## 2022-01-12 VITALS — BP 132/68 | HR 57 | Temp 97.7°F | Ht 62.0 in | Wt 146.0 lb

## 2022-01-12 DIAGNOSIS — E7800 Pure hypercholesterolemia, unspecified: Secondary | ICD-10-CM

## 2022-01-12 DIAGNOSIS — E559 Vitamin D deficiency, unspecified: Secondary | ICD-10-CM

## 2022-01-12 DIAGNOSIS — I10 Essential (primary) hypertension: Secondary | ICD-10-CM

## 2022-01-12 DIAGNOSIS — F411 Generalized anxiety disorder: Secondary | ICD-10-CM | POA: Diagnosis not present

## 2022-01-12 DIAGNOSIS — E8940 Asymptomatic postprocedural ovarian failure: Secondary | ICD-10-CM

## 2022-01-12 DIAGNOSIS — Z0000 Encounter for general adult medical examination without abnormal findings: Secondary | ICD-10-CM | POA: Diagnosis not present

## 2022-01-12 DIAGNOSIS — S46812D Strain of other muscles, fascia and tendons at shoulder and upper arm level, left arm, subsequent encounter: Secondary | ICD-10-CM

## 2022-01-12 DIAGNOSIS — Z79890 Hormone replacement therapy (postmenopausal): Secondary | ICD-10-CM | POA: Diagnosis not present

## 2022-01-12 DIAGNOSIS — N289 Disorder of kidney and ureter, unspecified: Secondary | ICD-10-CM

## 2022-01-12 MED ORDER — SPIRONOLACTONE 25 MG PO TABS
25.0000 mg | ORAL_TABLET | Freq: Every day | ORAL | 3 refills | Status: DC
  Filled 2022-01-12: qty 90, 90d supply, fill #0
  Filled 2022-07-17: qty 90, 90d supply, fill #1
  Filled 2022-10-16: qty 90, 90d supply, fill #2
  Filled 2022-12-20: qty 90, 90d supply, fill #3

## 2022-01-12 MED ORDER — ATORVASTATIN CALCIUM 20 MG PO TABS
20.0000 mg | ORAL_TABLET | Freq: Every morning | ORAL | 3 refills | Status: DC
  Filled 2022-01-12 – 2022-03-20 (×2): qty 90, 90d supply, fill #0
  Filled 2022-06-25: qty 90, 90d supply, fill #1
  Filled 2022-09-17: qty 90, 90d supply, fill #2
  Filled 2022-12-20: qty 90, 90d supply, fill #3

## 2022-01-12 MED ORDER — BISOPROLOL FUMARATE 5 MG PO TABS
5.0000 mg | ORAL_TABLET | Freq: Every day | ORAL | 3 refills | Status: DC
  Filled 2022-01-12 – 2022-03-20 (×3): qty 90, 90d supply, fill #0
  Filled 2022-03-20: qty 85, 85d supply, fill #0
  Filled 2022-06-25: qty 90, 90d supply, fill #1
  Filled 2022-09-17: qty 90, 90d supply, fill #2
  Filled 2022-12-11: qty 90, 90d supply, fill #3

## 2022-01-12 MED ORDER — BENAZEPRIL HCL 20 MG PO TABS
20.0000 mg | ORAL_TABLET | Freq: Every day | ORAL | 3 refills | Status: DC
  Filled 2022-01-12 – 2022-02-17 (×2): qty 90, 90d supply, fill #0
  Filled 2022-05-22: qty 90, 90d supply, fill #1
  Filled 2022-08-14: qty 90, 90d supply, fill #2
  Filled 2022-11-13: qty 90, 90d supply, fill #3

## 2022-01-12 NOTE — Assessment & Plan Note (Signed)
Continues HRT through OBGYN.
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
## 2022-01-12 NOTE — Assessment & Plan Note (Signed)
Preventative protocols reviewed and updated unless pt declined. Discussed healthy diet and lifestyle.  

## 2022-01-12 NOTE — Assessment & Plan Note (Signed)
Chronic, stable on current 3 drug regimen, tolerating well - continue this.

## 2022-01-12 NOTE — Assessment & Plan Note (Signed)
Chronic, stable on atorvastatin 20 mg daily.  The 10-year ASCVD risk score (Arnett DK, et al., 2019) is: 7.5%   Values used to calculate the score:     Age: 66 years     Sex: Female     Is Non-Hispanic African American: Yes     Diabetic: No     Tobacco smoker: No     Systolic Blood Pressure: 921 mmHg     Is BP treated: Yes     HDL Cholesterol: 41.2 mg/dL     Total Cholesterol: 142 mg/dL

## 2022-01-12 NOTE — Assessment & Plan Note (Signed)
Chronic, stable on vitamin D 1000 units daily-continue.

## 2022-01-12 NOTE — Patient Instructions (Addendum)
Send [place] dates of latest COVID vaccine.  You are doing well today.  Continue staying well hydrated.  Return as needed or in 6 months for BP f/u visit.   Health Maintenance for Postmenopausal Women Menopause is a normal process in which your ability to get pregnant comes to an end. This process happens slowly over many months or years, usually between the ages of 37 and 67. Menopause is complete when you have missed your menstrual period for 12 months. It is important to talk with your health care provider about some of the most common conditions that affect women after menopause (postmenopausal women). These include heart disease, cancer, and bone loss (osteoporosis). Adopting a healthy lifestyle and getting preventive care can help to promote your health and wellness. The actions you take can also lower your chances of developing some of these common conditions. What are the signs and symptoms of menopause? During menopause, you may have the following symptoms: Hot flashes. These can be moderate or severe. Night sweats. Decrease in sex drive. Mood swings. Headaches. Tiredness (fatigue). Irritability. Memory problems. Problems falling asleep or staying asleep. Talk with your health care provider about treatment options for your symptoms. Do I need hormone replacement therapy? Hormone replacement therapy is effective in treating symptoms that are caused by menopause, such as hot flashes and night sweats. Hormone replacement carries certain risks, especially as you become older. If you are thinking about using estrogen or estrogen with progestin, discuss the benefits and risks with your health care provider. How can I reduce my risk for heart disease and stroke? The risk of heart disease, heart attack, and stroke increases as you age. One of the causes may be a change in the body's hormones during menopause. This can affect how your body uses dietary fats, triglycerides, and cholesterol. Heart  attack and stroke are medical emergencies. There are many things that you can do to help prevent heart disease and stroke. Watch your blood pressure High blood pressure causes heart disease and increases the risk of stroke. This is more likely to develop in people who have high blood pressure readings or are overweight. Have your blood pressure checked: Every 3-5 years if you are 35-43 years of age. Every year if you are 54 years old or older. Eat a healthy diet  Eat a diet that includes plenty of vegetables, fruits, low-fat dairy products, and lean protein. Do not eat a lot of foods that are high in solid fats, added sugars, or sodium. Get regular exercise Get regular exercise. This is one of the most important things you can do for your health. Most adults should: Try to exercise for at least 150 minutes each week. The exercise should increase your heart rate and make you sweat (moderate-intensity exercise). Try to do strengthening exercises at least twice each week. Do these in addition to the moderate-intensity exercise. Spend less time sitting. Even light physical activity can be beneficial. Other tips Work with your health care provider to achieve or maintain a healthy weight. Do not use any products that contain nicotine or tobacco. These products include cigarettes, chewing tobacco, and vaping devices, such as e-cigarettes. If you need help quitting, ask your health care provider. Know your numbers. Ask your health care provider to check your cholesterol and your blood sugar (glucose). Continue to have your blood tested as directed by your health care provider. Do I need screening for cancer? Depending on your health history and family history, you may need to have  cancer screenings at different stages of your life. This may include screening for: Breast cancer. Cervical cancer. Lung cancer. Colorectal cancer. What is my risk for osteoporosis? After menopause, you may be at  increased risk for osteoporosis. Osteoporosis is a condition in which bone destruction happens more quickly than new bone creation. To help prevent osteoporosis or the bone fractures that can happen because of osteoporosis, you may take the following actions: If you are 72-49 years old, get at least 1,000 mg of calcium and at least 600 international units (IU) of vitamin D per day. If you are older than age 54 but younger than age 29, get at least 1,200 mg of calcium and at least 600 international units (IU) of vitamin D per day. If you are older than age 31, get at least 1,200 mg of calcium and at least 800 international units (IU) of vitamin D per day. Smoking and drinking excessive alcohol increase the risk of osteoporosis. Eat foods that are rich in calcium and vitamin D, and do weight-bearing exercises several times each week as directed by your health care provider. How does menopause affect my mental health? Depression may occur at any age, but it is more common as you become older. Common symptoms of depression include: Feeling depressed. Changes in sleep patterns. Changes in appetite or eating patterns. Feeling an overall lack of motivation or enjoyment of activities that you previously enjoyed. Frequent crying spells. Talk with your health care provider if you think that you are experiencing any of these symptoms. General instructions See your health care provider for regular wellness exams and vaccines. This may include: Scheduling regular health, dental, and eye exams. Getting and maintaining your vaccines. These include: Influenza vaccine. Get this vaccine each year before the flu season begins. Pneumonia vaccine. Shingles vaccine. Tetanus, diphtheria, and pertussis (Tdap) booster vaccine. Your health care provider may also recommend other immunizations. Tell your health care provider if you have ever been abused or do not feel safe at home. Summary Menopause is a normal process  in which your ability to get pregnant comes to an end. This condition causes hot flashes, night sweats, decreased interest in sex, mood swings, headaches, or lack of sleep. Treatment for this condition may include hormone replacement therapy. Take actions to keep yourself healthy, including exercising regularly, eating a healthy diet, watching your weight, and checking your blood pressure and blood sugar levels. Get screened for cancer and depression. Make sure that you are up to date with all your vaccines. This information is not intended to replace advice given to you by your health care provider. Make sure you discuss any questions you have with your health care provider. Document Revised: 10/21/2020 Document Reviewed: 10/21/2020 Elsevier Patient Education  [employer].

## 2022-01-12 NOTE — Assessment & Plan Note (Signed)
Persistent tightness, discomfort to L trap muscle. rec heating pad, voltaren gel, robaxin muscle relaxant PRN. Update if not improving with this.

## 2022-01-12 NOTE — Assessment & Plan Note (Signed)
Improving over time, doing well on PRN hydroxyzine, did not tolerate lexapro well.

## 2022-01-12 NOTE — Assessment & Plan Note (Signed)
Overall stable period. GFR ranges 50-60s. Encouraged good hydration.

## 2022-02-12 ENCOUNTER — Encounter: Payer: Self-pay | Admitting: Gastroenterology

## 2022-02-17 ENCOUNTER — Other Ambulatory Visit: Payer: Self-pay | Admitting: Family Medicine

## 2022-02-17 ENCOUNTER — Other Ambulatory Visit (HOSPITAL_COMMUNITY): Payer: Self-pay

## 2022-02-17 DIAGNOSIS — E8940 Asymptomatic postprocedural ovarian failure: Secondary | ICD-10-CM

## 2022-02-18 ENCOUNTER — Other Ambulatory Visit (HOSPITAL_COMMUNITY): Payer: Self-pay

## 2022-02-20 ENCOUNTER — Ambulatory Visit
Admit: 2022-02-20 | Discharge: 2022-02-20 | Payer: PRIVATE HEALTH INSURANCE | Attending: Orthopaedic Surgery | Primary: Family

## 2022-02-20 DIAGNOSIS — M1632 Unilateral osteoarthritis resulting from hip dysplasia, left hip: Secondary | ICD-10-CM

## 2022-02-20 NOTE — Progress Notes (Signed)
Patient: Krista Fletcher                MRN: [id number]       SSN: [id number]  Date of Birth: 09/30/1955        AGE: 66 y.o.        SEX: female  BMI: Body mass index is 49.8 kg/m.    PCP: Isidore Moos, APRN - NP  02/20/22    Chief Complaint: Hip Pain (LEFT HIP )      HPI:  Krista Fletcher is a 66 y.o. female with chief complaint of   Chief Complaint   Patient presents with    Hip Pain     LEFT HIP        1. 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  She has severe degeneration and status failed conservative treatment with intra-articular corticosteroid injections, physical therapy, Tylenol, ibuprofen.  She can take the occasional Tylenol and ibuprofen which takes the edge off.  The first 2 corticosteroid injection she got did help her but subsequently they have not.  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  Her BMI is currently 50 and she weighs 255 pounds.  We set a goal weight loss for 30 pounds and will refer her to the bariatric program for weight loss.  I like to see her in 2 months to check on her progress and if she has made good progress towards the weight loss goal we will schedule her for left total hip arthroplasty.  2. Morbid obesity (HCC)  -     Ambulatory Referral to Bariatric Surgery  3. 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  There is deformation of the femoral head with subchondral sclerosis, subchondral cysts in both the acetabulum and the femoral head.  There are large osteophytes.  The left leg is approximately 3 cm shorter than  the right.      PHYSICAL EXAMINATION:  Ht 5' (1.524 m)   Wt 255 lb (115.7 kg)   BMI 49.80 kg/m   Body mass index is 49.8 kg/m.  Wt Readings from Last 3 Encounters:   02/20/22 255 lb (115.7 kg)   12/05/21 240 lb (108.9 kg)       GENERAL: Alert and oriented x3, in no acute distress.  HEENT: Normocephalic, atraumatic.    MSK: Left Hip Exam     Tenderness   The patient is experiencing no tenderness.     Range of Motion   Flexion:  100   External rotation:  30   Internal rotation: 0     Muscle Strength   Abduction: 5/5   Adduction: 5/5   Flexion: 5/5     Other   Erythema: absent  Scars: absent  Sensation: normal  Pulse: present    Comments:  Positive FADIR test             AMB PAIN ASSESSMENT 02/20/2022   Location of Pain Hip   Location  Modifiers Left   Severity of Pain 6     Tobacco Use: Low Risk     Smoking Tobacco Use: Never    Smokeless Tobacco Use: Never    Passive Exposure: Never         Past Medical History:   Diagnosis Date    Hypertension        Family History   Problem Relation Age of Onset    No Known Problems Mother     No Known Problems Father        Current Outpatient Medications   Medication Sig Dispense Refill    brimonidine-timolol (COMBIGAN) 0.2-0.5 % ophthalmic solution Combigan 0.2 %-0.5 % eye drops      amLODIPine (NORVASC) 10 MG tablet amlodipine 10 mg tablet      latanoprost (XALATAN) 0.005 % ophthalmic solution latanoprost 0.005 % eye drops      meloxicam (MOBIC) 15 MG tablet meloxicam 15 mg tablet      telmisartan-hydroCHLOROthiazide (MICARDIS HCT) 80-25 MG per tablet telmisartan 80 mg-hydrochlorothiazide 25 mg tablet      albuterol sulfate HFA (PROAIR HFA) 108 (90 Base) MCG/ACT inhaler ProAir HFA 90 mcg/actuation aerosol inhaler      furosemide (LASIX) 20 MG tablet furosemide 20 mg tablet      lisinopril (PRINIVIL;ZESTRIL) 10 MG tablet lisinopril 10 mg tablet      prednisoLONE acetate (PRED FORTE) 1 % ophthalmic suspension prednisolone acetate 1 % eye drops,suspension       No current  facility-administered medications for this visit.        Allergies   Allergen Reactions    Sulfa Antibiotics        History reviewed. No pertinent surgical history.    Social History     Socioeconomic History    Marital status: Divorced     Spouse name: Not on file    Number of children: Not on file    Years of education: Not on file    Highest education level: Not on file   Occupational History    Not on file   Tobacco Use    Smoking status: Never     Passive exposure: Never    Smokeless tobacco: Never   Vaping Use    Vaping Use: Never used   Substance and Sexual Activity    Alcohol use: Not Currently    Drug use: Never    Sexual activity: Not Currently   Other Topics Concern    Not on file   Social History Narrative    Not on file     Social Determinants of Health     Financial Resource Strain: Not on file   Food Insecurity: Not on file   Transportation Needs: Not on file   Physical Activity: Not on file   Stress: Not on file   Social Connections: Not on file   Intimate Partner Violence: Not on file   Housing Stability: Not on file       REVIEW OF SYSTEMS:      No changes from previous review of systems unless noted.    @APPTDATE @      Prescription medication management discussed with patient.     Electronically signed by: , DO    Note: This note was completed using voice recognition software.  Any typographical/name errors or mistakes are unintentional.

## 2022-02-20 NOTE — Assessment & Plan Note (Signed)
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  She has severe degeneration and status failed conservative treatment with intra-articular corticosteroid injections, physical therapy, Tylenol, ibuprofen.  She can take the occasional Tylenol and ibuprofen which takes the edge off.  The first 2 corticosteroid injection she got did help her but subsequently they have not.  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  Her BMI is currently 50 and she weighs 255 pounds.  We set a goal weight loss for 30 pounds and will refer her to the bariatric program for weight loss.  I like to see her in 2 months to check on her progress and if she has made good progress towards the weight loss goal we will schedule her for left total hip arthroplasty.

## 2022-03-10 ENCOUNTER — Other Ambulatory Visit (HOSPITAL_COMMUNITY): Payer: Self-pay

## 2022-03-10 ENCOUNTER — Other Ambulatory Visit: Payer: Self-pay | Admitting: Family Medicine

## 2022-03-10 DIAGNOSIS — E8940 Asymptomatic postprocedural ovarian failure: Secondary | ICD-10-CM

## 2022-03-15 HISTORY — PX: COLONOSCOPY: SHX174

## 2022-03-16 ENCOUNTER — Ambulatory Visit (AMBULATORY_SURGERY_CENTER): Payer: Self-pay

## 2022-03-16 ENCOUNTER — Other Ambulatory Visit (HOSPITAL_COMMUNITY): Payer: Self-pay

## 2022-03-16 VITALS — Ht 62.0 in | Wt 143.0 lb

## 2022-03-16 DIAGNOSIS — Z86010 Personal history of colonic polyps: Secondary | ICD-10-CM

## 2022-03-16 MED ORDER — NA SULFATE-K SULFATE-MG SULF 17.5-3.13-1.6 GM/177ML PO SOLN
1.0000 | Freq: Once | ORAL | 0 refills | Status: AC
  Filled 2022-03-16: qty 354, 1d supply, fill #0

## 2022-03-16 NOTE — Progress Notes (Signed)
No egg or soy allergy known to patient  No issues known to pt with past sedation with any surgeries or procedures Patient denies ever being told they had issues or difficulty with intubation  No FH of Malignant Hyperthermia Pt is not on diet pills Pt is not on home 02  Pt is not on blood thinners  Pt denies issues with constipation  No A fib or A flutter Have any cardiac testing pending--NO Pt instructed to use [email or web address] or GoodRx for a price reduction on prep  Insurance verified during PV appt=[facility]

## 2022-03-20 ENCOUNTER — Other Ambulatory Visit: Payer: Self-pay

## 2022-03-20 ENCOUNTER — Other Ambulatory Visit (HOSPITAL_COMMUNITY): Payer: Self-pay

## 2022-03-23 ENCOUNTER — Other Ambulatory Visit (HOSPITAL_COMMUNITY): Payer: Self-pay

## 2022-03-25 ENCOUNTER — Other Ambulatory Visit (HOSPITAL_COMMUNITY): Payer: Self-pay

## 2022-03-27 ENCOUNTER — Encounter: Payer: Self-pay | Admitting: Gastroenterology

## 2022-03-31 ENCOUNTER — Other Ambulatory Visit: Payer: Self-pay | Admitting: Physician Assistant

## 2022-03-31 ENCOUNTER — Other Ambulatory Visit: Payer: Self-pay | Admitting: Family Medicine

## 2022-03-31 ENCOUNTER — Other Ambulatory Visit (HOSPITAL_COMMUNITY): Payer: Self-pay

## 2022-03-31 DIAGNOSIS — Z79890 Hormone replacement therapy (postmenopausal): Secondary | ICD-10-CM

## 2022-03-31 NOTE — Telephone Encounter (Signed)
Prescribed by ortho.  Robaxin Last filled:  04/16/21, #30 Last OV:  01/12/22, CPE Next OV:  07/15/22, 6 mo f/u

## 2022-04-01 MED ORDER — METHOCARBAMOL 500 MG PO TABS
500.0000 mg | ORAL_TABLET | Freq: Every day | ORAL | 1 refills | Status: DC
  Filled 2022-04-01: qty 30, 30d supply, fill #0
  Filled 2022-08-14: qty 30, 30d supply, fill #1

## 2022-04-02 ENCOUNTER — Other Ambulatory Visit (HOSPITAL_COMMUNITY): Payer: Self-pay

## 2022-04-03 ENCOUNTER — Other Ambulatory Visit (HOSPITAL_COMMUNITY): Payer: Self-pay

## 2022-04-06 ENCOUNTER — Encounter: Payer: Self-pay | Admitting: Family Medicine

## 2022-04-06 ENCOUNTER — Ambulatory Visit (INDEPENDENT_AMBULATORY_CARE_PROVIDER_SITE_OTHER): Payer: 59 | Admitting: Family Medicine

## 2022-04-06 ENCOUNTER — Other Ambulatory Visit (HOSPITAL_COMMUNITY): Payer: Self-pay

## 2022-04-06 VITALS — BP 145/79 | HR 51 | Wt 143.0 lb

## 2022-04-06 DIAGNOSIS — Z79890 Hormone replacement therapy (postmenopausal): Secondary | ICD-10-CM | POA: Diagnosis not present

## 2022-04-06 DIAGNOSIS — Z01419 Encounter for gynecological examination (general) (routine) without abnormal findings: Secondary | ICD-10-CM | POA: Diagnosis not present

## 2022-04-06 DIAGNOSIS — E8940 Asymptomatic postprocedural ovarian failure: Secondary | ICD-10-CM | POA: Diagnosis not present

## 2022-04-06 MED ORDER — ESTRADIOL 0.5 MG PO TABS
0.5000 mg | ORAL_TABLET | Freq: Every day | ORAL | 4 refills | Status: DC
  Filled 2022-04-06: qty 90, 90d supply, fill #0
  Filled 2022-07-10: qty 90, 90d supply, fill #1
  Filled 2022-12-10: qty 90, 90d supply, fill #2
  Filled 2023-03-10: qty 90, 90d supply, fill #3

## 2022-04-06 NOTE — Progress Notes (Signed)
Last mammo 6/23-WNL Decline breast exam Needs refill on horomones

## 2022-04-06 NOTE — Progress Notes (Signed)
   GYNECOLOGY ANNUAL PREVENTATIVE CARE ENCOUNTER NOTE  Subjective:   Jacqueline Waters is a 66 y.o. [id number] female here for a routine annual gynecologic exam.  Current complaints: none.     On HRT and feels better. Has self tapered to '1mg'$  qod. Does have rebound when she goes without medication  Had breast reduction since last visit. Helped shoulder pain Reports continued aches and pains, see ortho for back pain.   Denies abnormal vaginal bleeding, discharge, pelvic pain, problems with intercourse or other gynecologic concerns.    Gynecologic History No LMP recorded. Patient has had a hysterectomy. Contraception: status post hysterectomy Last Pap: NA Last mammogram: 2023. Results were: normal  Health Maintenance Due  Topic Date Due   HIV Screening  Never done   INFLUENZA VACCINE  01/13/2022   Pneumonia Vaccine 68+ Years old (1 - PCV) Never done   DEXA SCAN  Never done   COVID-19 Vaccine (4 - Moderna series) 02/13/2022   COLONOSCOPY (Pts 45-85yr Insurance coverage will need to be confirmed)  04/03/2022    The following portions of the patient's history were reviewed and updated as appropriate: allergies, current medications, past family history, past medical history, past social history, past surgical history and problem list.  Review of Systems Pertinent items are noted in HPI.   Objective:  BP (!) 145/79   Pulse (!) 51   Wt 143 lb (64.9 kg)   BMI 26.16 kg/m  CONSTITUTIONAL: Well-developed, well-nourished female in no acute distress.  HENT:  Normocephalic, atraumatic EYES:  No scleral icterus.  NECK: Normal range of motion, supple, no masses.  Normal thyroid.  SKIN: Skin is warm and dry. No rash noted. Not diaphoretic. No erythema. No pallor. NEUROLOGIC: Alert and oriented to person, place, and time. Normal reflexes, muscle tone coordination. No cranial nerve deficit noted. PSYCHIATRIC: Normal mood and affect. Normal behavior. Normal judgment and thought  content. CARDIOVASCULAR: Normal heart rate noted, RESPIRATORY: Effort normal, no problems with respiration noted. BREASTS: declined ABDOMEN: Soft,  no distention noted.  No tenderness, rebound or guarding.  PELVIC: declined MUSCULOSKELETAL: Normal range of motion.     Assessment and Plan:  1) Annual gynecologic examination:  Will follow up results of pap smear and manage accordingly. Routine preventative health maintenance measures emphasized. Reviewed perimenopausal symptoms and management.   1. Surgical menopause on hormone replacement therapy (([id number] Currently on estrogen '2mg'$  daily but has self tapered to '1mg'$  qod. Has gone almost 1 week without estrogen but then had rebound sx Discussed slow taper and consistent daily dosing Will start at 0.'5mg'$  daily for 4 weeks and then reduction to 0.'25mg'$  if possible  Goal to be off estrogen in 6-12 months  2. Well woman exam with routine gynecological exam Desires Dexa No need to evaluate cuff-- no sx today Declines breast exam, mammogram is up to date   Please refer to After Visit Summary for other counseling recommendations.   Return in about 1 year (around 04/07/2023) for Yearly wellness exam/HRT discussion.  KCaren Macadam MD, MPH, ABFM [facility]

## 2022-04-07 ENCOUNTER — Encounter: Payer: Self-pay | Admitting: Gastroenterology

## 2022-04-07 ENCOUNTER — Ambulatory Visit (AMBULATORY_SURGERY_CENTER): Payer: 59 | Admitting: Gastroenterology

## 2022-04-07 VITALS — BP 122/63 | HR 55 | Temp 96.2°F | Resp 19 | Ht 62.0 in | Wt 143.0 lb

## 2022-04-07 DIAGNOSIS — I10 Essential (primary) hypertension: Secondary | ICD-10-CM | POA: Diagnosis not present

## 2022-04-07 DIAGNOSIS — Z09 Encounter for follow-up examination after completed treatment for conditions other than malignant neoplasm: Secondary | ICD-10-CM | POA: Diagnosis not present

## 2022-04-07 DIAGNOSIS — Z86010 Personal history of colonic polyps: Secondary | ICD-10-CM

## 2022-04-07 DIAGNOSIS — Z1211 Encounter for screening for malignant neoplasm of colon: Secondary | ICD-10-CM | POA: Diagnosis not present

## 2022-04-07 MED ORDER — SODIUM CHLORIDE 0.9 % IV SOLN: 500.0000 mL | Freq: Once | INTRAVENOUS | Status: DC
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
## 2022-04-07 NOTE — Progress Notes (Signed)
Pt's states no medical or surgical changes since previsit or office visit. 

## 2022-04-07 NOTE — Patient Instructions (Signed)
There were no polyps seen today!  You will need another screening colonoscopy in 7 years, you will receive a letter at that time when you are due for the procedure.   Please call us at [phone] if you have a change in bowel habits, change in family history of colo-rectal cancer, rectal bleeding or other GI concern before that time.  Handouts given for diverticulosis and high fiber diet.  YOU HAD AN ENDOSCOPIC PROCEDURE TODAY AT [facility]:   Refer to the procedure report that was given to you for any specific questions about what was found during the examination.  If the procedure report does not answer your questions, please call your gastroenterologist to clarify.  If you requested that your care partner not be given the details of your procedure findings, then the procedure report has been included in a sealed envelope for you to review at your convenience later.  YOU SHOULD EXPECT: Some feelings of bloating in the abdomen. Passage of more gas than usual.  Walking can help get rid of the air that was put into your GI tract during the procedure and reduce the bloating. If you had a lower endoscopy (such as a colonoscopy or flexible sigmoidoscopy) you may notice spotting of blood in your stool or on the toilet paper. If you underwent a bowel prep for your procedure, you may not have a normal bowel movement for a few days.  Please Note:  You might notice some irritation and congestion in your nose or some drainage.  This is from the oxygen used during your procedure.  There is no need for concern and it should clear up in a day or so.  SYMPTOMS TO REPORT IMMEDIATELY:  Following lower endoscopy (colonoscopy):  Excessive amounts of blood in the stool  Significant tenderness or worsening of abdominal pains  Swelling of the abdomen that is new, acute  Fever of 100F or higher For urgent or emergent issues, a gastroenterologist can be reached at any hour by calling [phone]  [phone]. Do not use MyChart messaging for urgent concerns.    DIET:  We do recommend a small meal at first, but then you may proceed to your regular diet.  Drink plenty of fluids but you should avoid alcoholic beverages for 24 hours.  ACTIVITY:  You should plan to take it easy for the rest of today and you should NOT DRIVE or use heavy machinery until tomorrow (because of the sedation medicines used during the test).    FOLLOW UP: Our staff will call the number listed on your records the next business day following your procedure.  We will call around 7:15- 8:00 am to check on you and address any questions or concerns that you may have regarding the information given to you following your procedure. If we do not reach you, we will leave a message.    SIGNATURES/CONFIDENTIALITY: You and/or your care partner have signed paperwork which will be entered into your electronic medical record.  These signatures attest to the fact that that the information above on your After Visit Summary has been reviewed and is understood.  Full responsibility of the confidentiality of this discharge information lies with you and/or your care-partner.

## 2022-04-07 NOTE — Progress Notes (Signed)
Report to PACU, RN, vss, BBS= Clear.  

## 2022-04-07 NOTE — Op Note (Signed)
[facility] Patient Name: Jacqueline Waters Procedure Date: 04/07/2022 8:21 AM MRN: [id number] Endoscopist: Gabriel Mansouraty , MD Age: 66 Referring MD:  Date of Birth: 02/11/1956 Gender: Female Account #: [id number] Procedure:                Colonoscopy Indications:              Surveillance: Personal history of adenomatous                            polyps on last colonoscopy 3 years ago Medicines:                Monitored Anesthesia Care Procedure:                Pre-Anesthesia Assessment:                           - Prior to the procedure, a History and Physical                            was performed, and patient medications and                            allergies were reviewed. The patient's tolerance of                            previous anesthesia was also reviewed. The risks                            and benefits of the procedure and the sedation                            options and risks were discussed with the patient.                            All questions were answered, and informed consent                            was obtained. Prior Anticoagulants: The patient has                            taken no previous anticoagulant or antiplatelet                            agents. ASA Grade Assessment: II - A patient with                            mild systemic disease. After reviewing the risks                            and benefits, the patient was deemed in                            satisfactory condition to undergo the procedure.                             After obtaining informed consent, the colonoscope                            was passed under direct vision. Throughout the                            procedure, the patient's blood pressure, pulse, and                            oxygen saturations were monitored continuously. The                            Olympus CF-HQ190L (#[id number]) Colonoscope was                            introduced through the  anus and advanced to the 5                            cm into the ileum. The colonoscopy was performed                            without difficulty. The patient tolerated the                            procedure. The quality of the bowel preparation was                            good. The terminal ileum, ileocecal valve,                            appendiceal orifice, and rectum were photographed. Scope In: 8:27:19 AM Scope Out: 8:37:16 AM Scope Withdrawal Time: 0 hours 7 minutes 30 seconds  Total Procedure Duration: 0 hours 9 minutes 57 seconds  Findings:                 The digital rectal exam findings include                            hemorrhoids. Pertinent negatives include no                            palpable rectal lesions.                           The terminal ileum and ileocecal valve appeared                            normal.                           Multiple small-mouthed diverticula were found in                            the ascending colon.                             Normal mucosa was found in the entire colon.                           Non-bleeding non-thrombosed external and internal                            hemorrhoids were found during retroflexion. The                            hemorrhoids were Grade II (internal hemorrhoids                            that prolapse but reduce spontaneously). Complications:            No immediate complications. Estimated Blood Loss:     Estimated blood loss was minimal. Impression:               - Hemorrhoids found on digital rectal exam.                           - The examined portion of the ileum was normal.                           - Diverticulosis in the ascending colon.                           - Normal mucosa in the entire examined colon.                           - Non-bleeding non-thrombosed external and internal                            hemorrhoids. Recommendation:           - The patient will be observed  post-procedure,                            until all discharge criteria are met.                           - Discharge patient to home.                           - Patient has a contact number available for                            emergencies. The signs and symptoms of potential                            delayed complications were discussed with the                            patient. Return to normal activities tomorrow.                            Written discharge instructions were provided to the  patient.                           - High fiber diet.                           - Use FiberCon 1-2 tablets PO daily.                           - Continue present medications.                           - Repeat colonoscopy in 7 years for surveillance.                           - The findings and recommendations were discussed                            with the patient.                           - The findings and recommendations were discussed                            with the patient's family. Justice Britain, MD 04/07/2022 8:45:35 AM

## 2022-04-08 ENCOUNTER — Telehealth: Payer: Self-pay

## 2022-04-08 NOTE — Telephone Encounter (Signed)
  Follow up Call-     04/07/2022    8:07 AM  Call back number  Post procedure Call Back phone  # [phone]  Permission to leave phone message Yes     Patient questions:  Do you have a fever, pain , or abdominal swelling? Yes.   Pain Score  0 *  Have you tolerated food without any problems? Yes.    Have you been able to return to your normal activities? Yes.    Do you have any questions about your discharge instructions: Diet   No. Medications  No. Follow up visit  No.  Do you have questions or concerns about your Care? No.  Actions: * If pain score is 4 or above: No action needed, pain <4.

## 2022-04-15 LAB — HEMOGLOBIN A1C
Estimated Avg Glucose, External: 122 mg/dL (ref 91–123)
Hemoglobin A1C, External: 5.9 % — ABNORMAL HIGH (ref 4.8–5.6)

## 2022-04-24 ENCOUNTER — Ambulatory Visit: Payer: PRIVATE HEALTH INSURANCE | Attending: Orthopaedic Surgery | Primary: Family

## 2022-05-11 ENCOUNTER — Telehealth: Payer: Self-pay | Admitting: Family Medicine

## 2022-05-11 ENCOUNTER — Telehealth: Payer: Self-pay

## 2022-05-11 NOTE — Telephone Encounter (Signed)
Noted. Will evaluate during office visit

## 2022-05-11 NOTE — Telephone Encounter (Signed)
Per appt notes pt already has appt with Romilda Garret NP on 05/12/22.sending note to Romilda Garret NP and [facility].

## 2022-05-11 NOTE — Telephone Encounter (Signed)
[facility] Day - Client TELEPHONE ADVICE RECORD [facility] Patient Name: Jacqueline Waters Gender: Female DOB: 25-Apr-1956 Age: 66 Y 2 M 26 D Return Phone Number: [phone] (Primary) Address: [place]  [address] Client [facility] Day - Client Client Site [place] Provider Ria Bush - MD Contact Type Call Who Is Calling Patient / Member / Family / Caregiver Call Type Triage / Clinical Relationship To Patient Self Return Phone Number [phone] (Primary) Chief Complaint Cold Symptom Reason for Call Symptomatic / Request for [place] states she has a cold for past 2 weeks. Translation No Nurse Assessment Nurse: Zorita Pang, RN, Deborah Date/Time (Eastern Time): 05/11/2022 10:27:03 AM Confirm and document reason for call. If symptomatic, describe symptoms. ---Last Wednesday the caller states that she did not feel well with body pain and fever. The next day she had a cough and sore throat. Then Friday and Saturday she stated around the house. Had fever 100.4 but did not last long. She states that her nose is running and yellow and red coming out. Also coughing up yellow mucus. Has streaks of red. No red in the expectorant. Does the patient have any new or worsening symptoms? ---Yes Will a triage be completed? ---Yes Related visit to physician within the last 2 weeks? ---No Does the PT have any chronic conditions? (i.e. diabetes, asthma, this includes High risk factors for pregnancy, etc.) ---Yes List chronic conditions. ---hypertension Is this a behavioral health or substance abuse call? ---No Guidelines Guideline Title Affirmed Question Affirmed Notes Nurse Date/Time (Eastern Time) Cough - Acute Productive SEVERE coughing spells (e.g., whooping sound after coughing, vomiting after coughing) Womble, RN, Neoma Laming 05/11/2022  10:30:46 AM PLEASE NOTE: All timestamps contained within this report are represented as [place] Standard Time. CONFIDENTIALTY NOTICE: This fax transmission is intended only for the addressee. It contains information that is legally privileged, confidential or otherwise protected from use or disclosure. If you are not the intended recipient, you are strictly prohibited from reviewing, disclosing, copying using or disseminating any of this information or taking any action in reliance on or regarding this information. If you have received this fax in error, please notify us immediately by telephone so that we can arrange for its return to [place]. Phone: [phone], Toll-Free: [phone], Fax: [phone] Page: 2 of 2 Call Id: [id number] [place]. Time Eilene Ghazi Time) Disposition Final User 05/11/2022 10:14:58 AM Attempt made - message left Zorita Pang, RN, Neoma Laming 05/11/2022 10:34:53 AM See PCP within 24 Hours Yes Zorita Pang, RN, Neoma Laming Final Disposition 05/11/2022 10:34:53 AM See PCP within 24 Hours Yes Zorita Pang, RN, Garrel Ridgel Disagree/Comply Comply Caller Understands Yes PreDisposition Call Doctor Care Advice Given Per Guideline SEE PCP WITHIN 24 HOURS: * Drink warm fluids. Inhale warm mist. This can help relax the airway and also loosen up phlegm. * HOME REMEDY - HARD CANDY: Hard candy works just as well as over-the-counter cough drops. People who have diabetes should use sugar-free candy. * You become worse Comments User: Marquis Buggy, RN Date/Time Eilene Ghazi Time): 05/11/2022 10:39:18 AM This nurse did point out that she should not be taking over the counter cold/flu type medications as they can increase her BP and she states that they don't work for her. Referrals REFERRED TO PCP OFFIC

## 2022-05-11 NOTE — Telephone Encounter (Signed)
error 

## 2022-05-12 ENCOUNTER — Encounter: Payer: Self-pay | Admitting: Nurse Practitioner

## 2022-05-12 ENCOUNTER — Ambulatory Visit: Payer: 59 | Admitting: Nurse Practitioner

## 2022-05-12 VITALS — BP 130/84 | HR 64 | Temp 98.2°F | Resp 10 | Ht 62.0 in | Wt 141.5 lb

## 2022-05-12 DIAGNOSIS — J069 Acute upper respiratory infection, unspecified: Secondary | ICD-10-CM | POA: Diagnosis not present

## 2022-05-12 NOTE — Patient Instructions (Signed)
Nice to see you today I think this is on the tail end of the illness If you do not continue to improve or get worse please reach out to the office or mychart message me

## 2022-05-12 NOTE — Progress Notes (Signed)
Acute Office Visit  Subjective:     Patient ID: Jacqueline Waters, female    DOB: January 01, 1956, 66 y.o.   MRN: [id number]  Chief Complaint  Patient presents with   Nasal Congestion    Started on 05/06/22 and had a fever 100.4, sore throat and body aches at that time, feels some better since then has Runny nose, blowing out yellow mucus, coughing up yellowish phlegm, hoarse. Covid test was negative on 05/06/22    HPI Patient is in today for sick symptoms  States that she does not know of sick contact. 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 Negative for chills and fever.       Appetite is normal Fluid intake is good   HENT:  Positive for ear pain (full sensation). Negative for ear discharge, sinus pain and sore throat.   Respiratory:  Positive for cough and sputum production. Negative for shortness of breath.   Gastrointestinal:  Negative for abdominal pain, diarrhea, nausea and vomiting.  Neurological:  Negative for headaches.        Objective:    BP 130/84   Pulse 64   Temp 98.2 F (36.8 C)   Resp 10   Ht '5\' 2"'$  (1.575 m)   Wt 141 lb 8 oz (64.2 kg)   SpO2 98%   BMI 25.88 kg/m    Physical Exam Vitals and nursing note reviewed.  Constitutional:      Appearance: Normal appearance.  HENT:     Right Ear: Tympanic membrane, ear canal and external ear normal.     Left Ear: Tympanic membrane, ear canal and external ear normal.     Nose:     Right Sinus: No maxillary sinus tenderness or frontal sinus tenderness.     Left Sinus: Maxillary sinus  tenderness present. No frontal sinus tenderness.     Mouth/Throat:     Mouth: Mucous membranes are moist.     Pharynx: Oropharynx is clear.  Cardiovascular:     Rate and Rhythm: Normal rate and regular rhythm.     Heart sounds: Normal heart sounds.  Pulmonary:     Effort: Pulmonary effort is normal.     Breath sounds: Normal breath sounds.  Lymphadenopathy:     Cervical: No cervical adenopathy.  Neurological:     Mental Status: She is alert.     No results found for any visits on 05/12/22.      Assessment & Plan:   Problem List Items Addressed This Visit       Respiratory   Viral URI with cough - Primary    Symptoms are improving COVID test was negative that patient at home.  Continue over-the-counter treatments as beneficial such as Delsym for cough and over-the-counter analgesics for discomfort or pain.  Rest push fluids she is not better by Friday she will reach out to me via MyChart or call the office and we will consider antibiotic use at that time.       No orders of the defined types were placed in this encounter.  Return if symptoms worsen or fail to improve.  Romilda Garret, NP

## 2022-05-12 NOTE — Assessment & Plan Note (Signed)
Symptoms are improving COVID test was negative that patient at home.  Continue over-the-counter treatments as beneficial such as Delsym for cough and over-the-counter analgesics for discomfort or pain.  Rest push fluids she is not better by Friday she will reach out to me via MyChart or call the office and we will consider antibiotic use at that time.

## 2022-05-22 ENCOUNTER — Other Ambulatory Visit (HOSPITAL_COMMUNITY): Payer: Self-pay

## 2022-06-05 ENCOUNTER — Ambulatory Visit: Payer: PRIVATE HEALTH INSURANCE | Attending: Orthopaedic Surgery | Primary: Family

## 2022-06-25 ENCOUNTER — Telehealth: Payer: Self-pay

## 2022-06-25 NOTE — Telephone Encounter (Signed)
Notified pt of bone density appt on 1/29 at 10:30.am via phone

## 2022-07-09 NOTE — Telephone Encounter (Signed)
Formatting of this note might be different from the original.  Will separate medication to telmisartan 80 mg and HCTZ 25 mg   Electronically signed by Rod Can, FNP at 07/09/2022 11:29 AM EST

## 2022-07-09 NOTE — Telephone Encounter (Signed)
Formatting of this note might be different from the original.  Patient is requesting a refill on her blood pressure meds. She states her insurance no longer covers the one she is currently on.  Electronically signed by Leonidas Romberg at 07/09/2022  9:58 AM EST

## 2022-07-09 NOTE — Telephone Encounter (Signed)
Formatting of this note might be different from the original.  Patient stated that [employer] is longer covered.     Received a letter a couple of months ago but unsure if it listed alternatives, could not find letter at this time.     Asking if alternative can be sent?      Electronically signed by Vickki Hearing, LPN at 95/28/4132 44:01 AM EST

## 2022-07-09 NOTE — Telephone Encounter (Signed)
Formatting of this note might be different from the original.  Which one is not covered? Did she call insurance to see what is on her formulary for anti hypertensive's?    Electronically signed by Rod Can, FNP at 07/09/2022 10:35 AM EST

## 2022-07-09 NOTE — Telephone Encounter (Signed)
Will separate medication to telmisartan 80 mg and HCTZ 25 mg

## 2022-07-09 NOTE — Addendum Note (Signed)
Addended by: Phill Myron on: 07/09/2022 11:30 AM     Modules accepted: Orders      Electronically signed by Rod Can, FNP at 07/09/2022 11:30 AM EST

## 2022-07-09 NOTE — Telephone Encounter (Signed)
Formatting of this note might be different from the original.  Patient notified.   Electronically signed by Vickki Hearing, LPN at 29/79/8921  1:94 PM EST

## 2022-07-10 ENCOUNTER — Telehealth: Payer: Self-pay | Admitting: Family Medicine

## 2022-07-10 DIAGNOSIS — E2839 Other primary ovarian failure: Secondary | ICD-10-CM

## 2022-07-10 NOTE — Telephone Encounter (Signed)
Spoke with pt notifying her Dr. Darnell Level placed the order. Says she will call imaging office on [address] to schedule BMD. Pt expresses her thanks.

## 2022-07-10 NOTE — Addendum Note (Signed)
Addended by: Ria Bush on: 07/10/2022 02:01 PM   Modules accepted: Orders

## 2022-07-10 NOTE — Telephone Encounter (Signed)
Ordered to [facility].  Plz call and schedule for patient or give her # to schedule.  I don't know the #, Ashtyn may know.

## 2022-07-10 NOTE — Telephone Encounter (Signed)
Patient called in and stated that her OBGYN sent an order in for a Bone density scan but she has to go to [facility]. She was wanting for Dr. Darnell Level to put an order for one so she can go to the location on [address]. Please advise. Thank you!

## 2022-07-13 ENCOUNTER — Other Ambulatory Visit (HOSPITAL_COMMUNITY): Payer: Commercial Managed Care - PPO

## 2022-07-15 ENCOUNTER — Encounter: Payer: Self-pay | Admitting: Family Medicine

## 2022-07-15 ENCOUNTER — Ambulatory Visit: Payer: 59 | Admitting: Family Medicine

## 2022-07-15 VITALS — BP 140/74 | HR 68 | Temp 97.5°F | Ht 62.0 in | Wt 142.2 lb

## 2022-07-15 DIAGNOSIS — N1831 Chronic kidney disease, stage 3a: Secondary | ICD-10-CM

## 2022-07-15 DIAGNOSIS — I10 Essential (primary) hypertension: Secondary | ICD-10-CM | POA: Diagnosis not present

## 2022-07-15 DIAGNOSIS — L84 Corns and callosities: Secondary | ICD-10-CM | POA: Diagnosis not present

## 2022-07-15 NOTE — Assessment & Plan Note (Addendum)
Chronic. BP elevated in office today however does improve on recheck, home readings remain well controlled. Continue current regimen including spironolactone, bisoprolol, benazepril. Amlodipine stopped prior to surgery 05/2021. Update renal function.

## 2022-07-15 NOTE — Assessment & Plan Note (Signed)
GFR remains in 50-60 range. Update renal panel.

## 2022-07-15 NOTE — Assessment & Plan Note (Addendum)
Discussed supportive measures including callus pad use and importance of loose footwear at box of shoe.  If desires further treatment, will let me know for podiatry referral.

## 2022-07-15 NOTE — Progress Notes (Signed)
Patient ID: Jacqueline Waters, female    DOB: 1955-10-02, 67 y.o.   MRN: [id number]  This visit was conducted in person.  BP (!) 140/74 (BP Location: Right Arm, Cuff Size: Normal)   Pulse 68   Temp (!) 97.5 F (36.4 C) (Temporal)   Ht '5\' 2"'$  (1.575 m)   Wt 142 lb 4 oz (64.5 kg)   SpO2 95%   BMI 26.02 kg/m    CC: 6 mo HTN f/u visit  Subjective:   HPI: Jacqueline Waters is a 67 y.o. female presenting on 07/15/2022 for Medical Management of Chronic Issues (Here for 6 mo HTN f/u. Also, c/o sore between toes on R foot. Area is painful when wearing shoes. Noticed about 2 wks ago. )   HTN - Compliant with current antihypertensive regimen of benazepril '20mg'$  daily, spironolactone '25mg'$  daily, bisoprolol '5mg'$  daily. Does check blood pressures at home: 132/80. No low blood pressure readings or symptoms of dizziness/syncope.  Denies HA, vision changes, CP/tightness, SOB, leg swelling.    2 wk h/o R toe pain   COVID infection 3-4 wks ago - took tylenol for this.      Relevant past medical, surgical, family and social history reviewed and updated as indicated. Interim medical history since our last visit reviewed. Allergies and medications reviewed and updated. Outpatient Medications Prior to Visit  Medication Sig Dispense Refill   acetaminophen (TYLENOL) 500 MG tablet Take 500 mg by mouth daily as needed.     atorvastatin (LIPITOR) 20 MG tablet Take 1 tablet (20 mg total) by mouth every morning. 90 tablet 3   benazepril (LOTENSIN) 20 MG tablet Take 1 tablet (20 mg total) by mouth daily. 90 tablet 3   bisoprolol (ZEBETA) 5 MG tablet Take 1 tablet (5 mg total) by mouth daily. 90 tablet 3   Cholecalciferol (VITAMIN D3) 25 MCG (1000 UT) CAPS Take 1 capsule by mouth daily.     diclofenac Sodium (VOLTAREN) 1 % GEL Apply 2 g topically 4 (four) times daily. 100 g 2   estradiol (ESTRACE) 0.5 MG tablet Take 1 tablet (0.5 mg total) by mouth daily. 90 tablet 4   hydrOXYzine (ATARAX) 25 MG tablet Take 0.5-1  tablets (12.5-25 mg total) by mouth 2 (two) times daily as needed for anxiety. 30 tablet 0   methocarbamol (ROBAXIN) 500 MG tablet Take 1 tablet (500 mg total) by mouth at bedtime. 30 tablet 1   spironolactone (ALDACTONE) 25 MG tablet Take 1 tablet (25 mg total) by mouth daily. 90 tablet 3   No facility-administered medications prior to visit.     Per HPI unless specifically indicated in ROS section below Review of Systems  Objective:  BP (!) 140/74 (BP Location: Right Arm, Cuff Size: Normal)   Pulse 68   Temp (!) 97.5 F (36.4 C) (Temporal)   Ht '5\' 2"'$  (1.575 m)   Wt 142 lb 4 oz (64.5 kg)   SpO2 95%   BMI 26.02 kg/m   Wt Readings from Last 3 Encounters:  07/15/22 142 lb 4 oz (64.5 kg)  05/12/22 141 lb 8 oz (64.2 kg)  04/07/22 143 lb (64.9 kg)      Physical Exam Vitals and nursing note reviewed.  Constitutional:      Appearance: Normal appearance. She is not ill-appearing.  HENT:     Head: Normocephalic and atraumatic.     Mouth/Throat:     Mouth: Mucous membranes are moist.     Pharynx: Oropharynx is clear. No  oropharyngeal exudate or posterior oropharyngeal erythema.  Eyes:     Extraocular Movements: Extraocular movements intact.     Conjunctiva/sclera: Conjunctivae normal.     Pupils: Pupils are equal, round, and reactive to light.  Cardiovascular:     Rate and Rhythm: Normal rate and regular rhythm.     Pulses: Normal pulses.     Heart sounds: Normal heart sounds. No murmur heard. Pulmonary:     Effort: Pulmonary effort is normal. No respiratory distress.     Breath sounds: Normal breath sounds. No wheezing or rhonchi.  Musculoskeletal:     Cervical back: Normal range of motion and neck supple. No rigidity.     Right lower leg: No edema.     Left lower leg: No edema.  Lymphadenopathy:     Cervical: No cervical adenopathy.  Skin:    General: Skin is warm and dry.     Findings: No rash.     Comments: Tender corn at base of medial 2nd toe on right foot   Neurological:     Mental Status: She is alert.  Psychiatric:        Mood and Affect: Mood normal.        Behavior: Behavior normal.        Assessment & Plan:   Problem List Items Addressed This Visit     Essential hypertension - Primary    Chronic. BP elevated in office today however does improve on recheck, home readings remain well controlled. Continue current regimen including spironolactone, bisoprolol, benazepril. Amlodipine stopped prior to surgery 05/2021. Update renal function.       Relevant Orders   Basic metabolic panel   CBC with Differential/Platelet   CKD (chronic kidney disease) stage 3, GFR 30-59 ml/min (HCC)    GFR remains in 50-60 range. Update renal panel.       Corn of toe    Discussed supportive measures including callus pad use and importance of loose footwear at box of shoe.  If desires further treatment, will let me know for podiatry referral.         No orders of the defined types were placed in this encounter.   Orders Placed This Encounter  Procedures   Basic metabolic panel   CBC with Differential/Platelet    Patient Instructions  For corn to right 2nd toe - continue callus pad/corn pad and loose fitting shoes. If interested in further treatment, let us know for referral to podiatrist. Blood pressures are staying ok. Lab test today to check kidney function.   Follow up plan: Return in about 6 months (around 01/13/2023), or if symptoms worsen or fail to improve, for annual exam, prior fasting for blood work.  Ria Bush, MD

## 2022-07-15 NOTE — Patient Instructions (Addendum)
For corn to right 2nd toe - continue callus pad/corn pad and loose fitting shoes. If interested in further treatment, let us know for referral to podiatrist. Blood pressures are staying ok. Lab test today to check kidney function.

## 2022-07-16 ENCOUNTER — Ambulatory Visit (INDEPENDENT_AMBULATORY_CARE_PROVIDER_SITE_OTHER)
Admission: RE | Admit: 2022-07-16 | Discharge: 2022-07-16 | Disposition: A | Discharge status: Home / Self Care | Payer: 59 | Source: Ambulatory Visit | Attending: Family Medicine | Admitting: Family Medicine

## 2022-07-16 DIAGNOSIS — E2839 Other primary ovarian failure: Secondary | ICD-10-CM | POA: Diagnosis not present

## 2022-07-16 LAB — CBC WITH DIFFERENTIAL/PLATELET
Basophils Absolute: 0.1 10*3/uL (ref 0.0–0.1)
Basophils Relative: 1.2 % (ref 0.0–3.0)
Eosinophils Absolute: 0.1 10*3/uL (ref 0.0–0.7)
Eosinophils Relative: 1.4 % (ref 0.0–5.0)
HCT: 40.5 % (ref 36.0–46.0)
Hemoglobin: 14 g/dL (ref 12.0–15.0)
Lymphocytes Relative: 36.9 % (ref 12.0–46.0)
Lymphs Abs: 1.6 10*3/uL (ref 0.7–4.0)
MCHC: 34.6 g/dL (ref 30.0–36.0)
MCV: 89.1 fl (ref 78.0–100.0)
Monocytes Absolute: 0.4 10*3/uL (ref 0.1–1.0)
Monocytes Relative: 10.1 % (ref 3.0–12.0)
Neutro Abs: 2.2 10*3/uL (ref 1.4–7.7)
Neutrophils Relative %: 50.4 % (ref 43.0–77.0)
Platelets: 249 10*3/uL (ref 150.0–400.0)
RBC: 4.54 Mil/uL (ref 3.87–5.11)
RDW: 13.7 % (ref 11.5–15.5)
WBC: 4.4 10*3/uL (ref 4.0–10.5)

## 2022-07-16 LAB — BASIC METABOLIC PANEL
BUN: 16 mg/dL (ref 6–23)
CO2: 30 mEq/L (ref 19–32)
Calcium: 10.9 mg/dL — ABNORMAL HIGH (ref 8.4–10.5)
Chloride: 102 mEq/L (ref 96–112)
Creatinine, Ser: 1.03 mg/dL (ref 0.40–1.20)
GFR: 57.1 mL/min — ABNORMAL LOW (ref >60.00)
Glucose, Bld: 113 mg/dL — ABNORMAL HIGH (ref 70–99)
Potassium: 4.2 mEq/L (ref 3.5–5.1)
Sodium: 138 mEq/L (ref 135–145)

## 2022-07-20 ENCOUNTER — Other Ambulatory Visit: Payer: Self-pay | Admitting: Family Medicine

## 2022-07-20 ENCOUNTER — Encounter: Payer: Self-pay | Admitting: Family Medicine

## 2022-07-23 ENCOUNTER — Other Ambulatory Visit (HOSPITAL_COMMUNITY): Payer: Self-pay

## 2022-07-24 ENCOUNTER — Ambulatory Visit
Admit: 2022-07-24 | Discharge: 2022-07-24 | Payer: PRIVATE HEALTH INSURANCE | Attending: Orthopaedic Surgery | Primary: Family

## 2022-07-24 DIAGNOSIS — M1632 Unilateral osteoarthritis resulting from hip dysplasia, left hip: Secondary | ICD-10-CM

## 2022-07-24 NOTE — Progress Notes (Signed)
Patient: Krista Fletcher                MRN: [id number]       SSN: [id number]  Date of Birth: 09/06/1955        AGE: 67 y.o.        SEX: female  BMI: Body mass index is 46.87 kg/m.    PCP: Roberto Scales, APRN - NP  07/24/22    Chief Complaint: Hip Pain (Left hip )      1. 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 Morbid obesity (Bruno)  3. Hypertension, unspecified type  4. Pre-op testing  -     Hemoglobin A1C; Future  -     APTT; Future  -     Protime-INR; Future  -     Urinalysis with Microscopic; Future  -     XR CHEST 1 VIEW; Future  -     Comprehensive Metabolic Panel; Future  -     CBC with Auto Differential; Future  -     EKG 12 Lead; Future  -     NICOTINE AND METABOLITES, URINE; Future  -     Urine Drug Screen; Future  -     Pain Mgmt Panel w/Refl,Ur; Future        HPI:  Krista Fletcher is a 67 y.o. female with chief complaint of   Chief Complaint   Patient presents with    Hip Pain     Left hip      Left hip pain for several years that used to do well with intra-articular corticosteroid injections bilateral is no longer work.    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  There is deformation of the femoral head with subchondral sclerosis, subchondral cysts in both the acetabulum and the femoral head.  There are large osteophytes.  The left leg is approximately 3 cm shorter than the right.      PHYSICAL EXAMINATION:  Ht 1.524 m (5')   Wt 108.9 kg (240 lb)   BMI 46.87 kg/m   Body mass index is 46.87 kg/m.  Wt Readings from Last 3 Encounters:   07/24/22 108.9 kg (240 lb)   02/20/22 115.7 kg (255 lb)   12/05/21 108.9 kg (240 lb)       GENERAL: Alert and oriented x3, in no acute  distress.  HEENT: Normocephalic, atraumatic.    MSK: Left Hip Exam     Tenderness   The patient is experiencing no tenderness.     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  Patient is been working with her primary care provider on weight loss  using water aerobics and diet.  She is managing her pain with occasional meloxicam and Voltaren gel as well as activity modification.  She has been lose 15 pounds recently.  I like her to lose an additional 15 pounds but we will start planning for a left total hip arthroplasty through a posterior lateral approach.  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 She has severe degeneration and status failed conservative treatment with intra-articular corticosteroid injections, physical therapy, Tylenol, ibuprofen. She can take the occasional Tylenol and ibuprofen which takes the edge off. The first 2 corticosteroid injection she got did help her but subsequently they have not. 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 Her BMI is currently 50 and she weighs 255 pounds. We set a goal weight loss for 30 pounds and will refer her to the bariatric program for weight loss. I like to see her in 2 months to check on her progress and if she has made good progress towards the weight loss goal we will schedule her for left total hip arthroplasty.         02/20/2022     3:19 PM   AMB PAIN ASSESSMENT   Location of Pain Hip   Location Modifiers Left   Severity of Pain 6      Tobacco Use: Low Risk  (07/24/2022)    Patient History     Smoking Tobacco Use: Never     Smokeless Tobacco Use: Never     Passive Exposure: Never         Past Medical History:   Diagnosis Date    Hypertension        Family History   Problem Relation Age of Onset    No Known Problems Mother     No Known Problems Father        Current Outpatient Medications   Medication Sig Dispense Refill    brimonidine-timolol (COMBIGAN) 0.2-0.5 % ophthalmic solution Combigan 0.2 %-0.5 % eye drops      amLODIPine (NORVASC) 10 MG tablet amlodipine 10 mg tablet      latanoprost (XALATAN) 0.005 % ophthalmic solution latanoprost 0.005 % eye drops      meloxicam (MOBIC) 15 MG tablet meloxicam 15 mg tablet      telmisartan-hydroCHLOROthiazide (MICARDIS HCT) 80-25 MG per tablet telmisartan 80 mg-hydrochlorothiazide 25 mg tablet      albuterol sulfate HFA (PROAIR HFA) 108 (90 Base) MCG/ACT inhaler ProAir HFA 90 mcg/actuation aerosol inhaler      furosemide (LASIX) 20 MG tablet furosemide 20 mg tablet      lisinopril (PRINIVIL;ZESTRIL) 10 MG tablet lisinopril 10 mg tablet      prednisoLONE acetate (PRED FORTE) 1 % ophthalmic suspension prednisolone acetate 1 % eye drops,suspension       No current facility-administered medications for this visit.        Allergies   Allergen Reactions    Sulfa Antibiotics        No past surgical history on file.    Social History     Socioeconomic History    Marital status: Divorced     Spouse name: Not on file    Number of children: Not on file    Years of education: Not on file    Highest education level: Not  on file   Occupational History    Not on file   Tobacco Use    Smoking status: Never     Passive exposure: Never    Smokeless tobacco: Never   Vaping Use    Vaping Use: Never used   Substance and Sexual Activity    Alcohol use: Not Currently    Drug use: Never    Sexual activity: Not Currently   Other Topics Concern    Not on file   Social History Narrative    Not on file     Social Determinants of  Health     Financial Resource Strain: Not on file   Food Insecurity: Not on file   Transportation Needs: Not on file   Physical Activity: Not on file   Stress: Not on file   Social Connections: Not on file   Intimate Partner Violence: Not on file   Housing Stability: Not on file       REVIEW OF SYSTEMS:      Negative except for that stated above.     @APPTDATE @      Prescription medication management discussed with patient.     Electronically signed by: Merdis Delay, DO    Note: This note was completed using voice recognition software.  Any typographical/name errors or mistakes are unintentional.

## 2022-08-11 ENCOUNTER — Other Ambulatory Visit (INDEPENDENT_AMBULATORY_CARE_PROVIDER_SITE_OTHER): Payer: 59

## 2022-08-11 LAB — VITAMIN D 25 HYDROXY (VIT D DEFICIENCY, FRACTURES): VITD: 34.08 ng/mL (ref 30.00–100.00)

## 2022-08-12 LAB — PTH, INTACT AND CALCIUM
Calcium: 11.4 mg/dL — ABNORMAL HIGH (ref 8.6–10.4)
PTH: 85 pg/mL — ABNORMAL HIGH (ref 16–77)

## 2022-08-14 ENCOUNTER — Other Ambulatory Visit: Payer: Self-pay

## 2022-08-14 ENCOUNTER — Other Ambulatory Visit: Payer: Self-pay | Admitting: Family Medicine

## 2022-08-17 ENCOUNTER — Encounter: Payer: Self-pay | Admitting: *Deleted

## 2022-08-17 ENCOUNTER — Other Ambulatory Visit: Payer: Self-pay | Admitting: Family Medicine

## 2022-08-17 ENCOUNTER — Other Ambulatory Visit: Payer: 59

## 2022-08-17 ENCOUNTER — Encounter: Payer: Self-pay | Admitting: Family Medicine

## 2022-08-17 DIAGNOSIS — E210 Primary hyperparathyroidism: Secondary | ICD-10-CM

## 2022-08-17 DIAGNOSIS — N1831 Chronic kidney disease, stage 3a: Secondary | ICD-10-CM

## 2022-08-17 NOTE — Telephone Encounter (Signed)
Refill request Tylenol Last office visit 07/15/22

## 2022-08-19 ENCOUNTER — Other Ambulatory Visit: Payer: Self-pay | Admitting: Radiology

## 2022-08-19 ENCOUNTER — Other Ambulatory Visit (HOSPITAL_COMMUNITY): Payer: Self-pay

## 2022-08-19 MED ORDER — ACETAMINOPHEN 500 MG PO TABS
500.0000 mg | ORAL_TABLET | Freq: Every day | ORAL | 1 refills | Status: AC | PRN
  Filled 2022-08-19 – 2022-10-12 (×4): qty 30, 30d supply, fill #0

## 2022-08-19 NOTE — Telephone Encounter (Signed)
[place] Night - Client Nonclinical Telephone Record  [facility] Night - Client Client Site [place] Provider Ria Bush - MD Contact Type Call Who Is Calling Patient / Member / Family / Caregiver Caller Name Elynn Bertch Caller Phone Number [phone] Patient Name Jacqueline Waters Patient DOB 02-01-1956 Call Type Message Only Information Provided Reason for Call Request for General Office Information Initial Comment Caller states she left her urine sample in bucket. She was instructed to leave it there. Additional Comment hrs Disp. Time Disposition Final User 08/19/2022 7:22:37 AM General Information Provided Yes Idolina Primer Call Closed By: Idolina Primer Transaction Date/Time: 08/19/2022 7:20:17 AM (ET   Sending note to Dr Darnell Level and G pool.

## 2022-08-19 NOTE — Telephone Encounter (Signed)
ERx 

## 2022-08-20 ENCOUNTER — Other Ambulatory Visit: Payer: Self-pay | Admitting: Family Medicine

## 2022-08-20 ENCOUNTER — Encounter: Payer: Self-pay | Admitting: Radiology

## 2022-08-20 DIAGNOSIS — E210 Primary hyperparathyroidism: Secondary | ICD-10-CM

## 2022-08-20 DIAGNOSIS — N1831 Chronic kidney disease, stage 3a: Secondary | ICD-10-CM

## 2022-08-20 LAB — CALCIUM, URINE, 24 HOUR: Calcium, 24H Urine: 204 mg/24 h

## 2022-08-26 ENCOUNTER — Ambulatory Visit
Admission: RE | Admit: 2022-08-26 | Discharge: 2022-08-26 | Disposition: A | Discharge status: Home / Self Care | Payer: 59 | Source: Ambulatory Visit | Attending: Family Medicine | Admitting: Family Medicine

## 2022-08-26 DIAGNOSIS — N1831 Chronic kidney disease, stage 3a: Secondary | ICD-10-CM

## 2022-08-26 DIAGNOSIS — N189 Chronic kidney disease, unspecified: Secondary | ICD-10-CM | POA: Diagnosis not present

## 2022-08-26 DIAGNOSIS — N281 Cyst of kidney, acquired: Secondary | ICD-10-CM | POA: Diagnosis not present

## 2022-08-26 DIAGNOSIS — E210 Primary hyperparathyroidism: Secondary | ICD-10-CM

## 2022-08-28 ENCOUNTER — Telehealth: Payer: Self-pay | Admitting: Family Medicine

## 2022-08-28 ENCOUNTER — Other Ambulatory Visit: Payer: Self-pay | Admitting: Family Medicine

## 2022-08-28 DIAGNOSIS — N7011 Chronic salpingitis: Secondary | ICD-10-CM | POA: Insufficient documentation

## 2022-08-28 DIAGNOSIS — R1900 Intra-abdominal and pelvic swelling, mass and lump, unspecified site: Secondary | ICD-10-CM

## 2022-08-28 DIAGNOSIS — N1831 Chronic kidney disease, stage 3a: Secondary | ICD-10-CM

## 2022-08-28 NOTE — Telephone Encounter (Signed)
Spoke with pt clarifying confusion about appt on 3/18. She now understands that she will be contacted on Mon, 08/31/22 for a STAT US that day. Then the 09/09/22 appt will automatically be canceled. Pt will make arrangements to be in the [place] area to be ready for [place].

## 2022-08-28 NOTE — Telephone Encounter (Signed)
Pt called stating [facility] & [facility] wouldn't be able to see her asap. Pt stated [facility] wouldn't see her until April & [facility] couldn't see her until 3/27. Spoke to Dr. Darnell Level, he stated someone would call pt to schedule images for Monday, 3/18. Call back # [phone]

## 2022-08-28 NOTE — Telephone Encounter (Signed)
Spoke with patient about renal US results - ordered stat pelvic US to be done Monday.

## 2022-08-31 NOTE — Telephone Encounter (Addendum)
New [place] order placed.  Will forward to Ashtyn to ensure everything needed is done, as I don't see option to choose [facility].

## 2022-08-31 NOTE — Addendum Note (Signed)
Addended by: Ria Bush on: 08/31/2022 05:16 PM   Modules accepted: Orders

## 2022-08-31 NOTE — Telephone Encounter (Signed)
Radiology called in regarding pt ultra sound stated need new orders sent in with pt Date OF Birth

## 2022-09-03 ENCOUNTER — Ambulatory Visit
Admission: RE | Admit: 2022-09-03 | Discharge: 2022-09-03 | Disposition: A | Discharge status: Home / Self Care | Payer: 59 | Source: Ambulatory Visit | Attending: Family Medicine | Admitting: Family Medicine

## 2022-09-03 DIAGNOSIS — R1900 Intra-abdominal and pelvic swelling, mass and lump, unspecified site: Secondary | ICD-10-CM | POA: Insufficient documentation

## 2022-09-09 ENCOUNTER — Other Ambulatory Visit: Payer: 59

## 2022-09-11 ENCOUNTER — Other Ambulatory Visit (HOSPITAL_COMMUNITY): Payer: Self-pay

## 2022-09-14 ENCOUNTER — Encounter: Payer: Self-pay | Admitting: Family Medicine

## 2022-09-16 ENCOUNTER — Telehealth: Payer: Self-pay

## 2022-09-16 NOTE — Telephone Encounter (Signed)
Pt called in to speak to Dr. Ernestina Patches or the nurse. Advised pt that the message was sent to Dr. Ernestina Patches, she may not be in the office until Friday.

## 2022-09-17 ENCOUNTER — Other Ambulatory Visit (HOSPITAL_COMMUNITY): Payer: Self-pay

## 2022-09-17 ENCOUNTER — Other Ambulatory Visit: Payer: Self-pay

## 2022-09-18 ENCOUNTER — Other Ambulatory Visit (HOSPITAL_COMMUNITY): Payer: Self-pay

## 2022-09-18 ENCOUNTER — Other Ambulatory Visit: Payer: Self-pay

## 2022-10-02 ENCOUNTER — Other Ambulatory Visit (HOSPITAL_COMMUNITY): Payer: Self-pay

## 2022-10-07 ENCOUNTER — Other Ambulatory Visit (HOSPITAL_COMMUNITY): Payer: Self-pay | Admitting: Surgery

## 2022-10-07 DIAGNOSIS — E213 Hyperparathyroidism, unspecified: Secondary | ICD-10-CM

## 2022-10-08 ENCOUNTER — Other Ambulatory Visit (HOSPITAL_COMMUNITY): Payer: Self-pay

## 2022-10-12 ENCOUNTER — Other Ambulatory Visit (HOSPITAL_COMMUNITY): Payer: Self-pay

## 2022-10-14 ENCOUNTER — Other Ambulatory Visit (HOSPITAL_COMMUNITY): Payer: Self-pay

## 2022-10-15 ENCOUNTER — Encounter (HOSPITAL_COMMUNITY)
Admission: RE | Admit: 2022-10-15 | Discharge: 2022-10-15 | Disposition: A | Discharge status: Home / Self Care | Payer: 59 | Source: Ambulatory Visit | Attending: Surgery | Admitting: Surgery

## 2022-10-15 DIAGNOSIS — E213 Hyperparathyroidism, unspecified: Secondary | ICD-10-CM | POA: Insufficient documentation

## 2022-10-15 DIAGNOSIS — E042 Nontoxic multinodular goiter: Secondary | ICD-10-CM | POA: Diagnosis not present

## 2022-10-15 DIAGNOSIS — E210 Primary hyperparathyroidism: Secondary | ICD-10-CM | POA: Diagnosis not present

## 2022-10-15 MED ORDER — TECHNETIUM TC 99M SESTAMIBI GENERIC - CARDIOLITE
25.4000 | Freq: Once | INTRAVENOUS | Status: AC | PRN
  Administered 2022-10-15: 25.4 via INTRAVENOUS

## 2022-10-15 NOTE — Progress Notes (Signed)
USN and Sestamibi scan point to the left inferior pole of the thyroid, but are not definitive for parathyroid adenoma.  Will request 4D-CT scan of neck with parathyroid protocol as discussed at the office.  Tresa Endo - please order.  I will contact patient with the results and make plans for management at that time.  Darnell Level, MD [facility] practice Office: [phone]

## 2022-10-18 ENCOUNTER — Encounter: Payer: Self-pay | Admitting: Family Medicine

## 2022-10-18 DIAGNOSIS — E210 Primary hyperparathyroidism: Secondary | ICD-10-CM

## 2022-10-20 ENCOUNTER — Other Ambulatory Visit (HOSPITAL_COMMUNITY): Payer: Self-pay | Admitting: Surgery

## 2022-10-20 DIAGNOSIS — E210 Primary hyperparathyroidism: Secondary | ICD-10-CM

## 2022-10-21 NOTE — Telephone Encounter (Signed)
Pt called in requesting a response to the message that was sent to PCP

## 2022-10-28 ENCOUNTER — Other Ambulatory Visit: Payer: Self-pay | Admitting: Family Medicine

## 2022-10-28 DIAGNOSIS — Z1231 Encounter for screening mammogram for malignant neoplasm of breast: Secondary | ICD-10-CM

## 2022-11-04 DIAGNOSIS — H52203 Unspecified astigmatism, bilateral: Secondary | ICD-10-CM | POA: Diagnosis not present

## 2022-11-04 DIAGNOSIS — H5203 Hypermetropia, bilateral: Secondary | ICD-10-CM | POA: Diagnosis not present

## 2022-11-04 DIAGNOSIS — H524 Presbyopia: Secondary | ICD-10-CM | POA: Diagnosis not present

## 2022-11-04 DIAGNOSIS — H25813 Combined forms of age-related cataract, bilateral: Secondary | ICD-10-CM | POA: Diagnosis not present

## 2022-11-06 ENCOUNTER — Ambulatory Visit (HOSPITAL_COMMUNITY)
Admission: RE | Admit: 2022-11-06 | Discharge: 2022-11-06 | Disposition: A | Discharge status: Home / Self Care | Payer: 59 | Source: Ambulatory Visit | Attending: Surgery | Admitting: Surgery

## 2022-11-06 DIAGNOSIS — E210 Primary hyperparathyroidism: Secondary | ICD-10-CM | POA: Insufficient documentation

## 2022-11-06 MED ORDER — IOHEXOL 350 MG/ML SOLN
75.0000 mL | Freq: Once | INTRAVENOUS | Status: AC | PRN
  Administered 2022-11-06: 75 mL via INTRAVENOUS

## 2022-11-15 NOTE — Progress Notes (Signed)
CT results noted.  USN and sestamibi scans also compared with CT scan.  All three studies point to the left thyroid lobe and nodules as the probable parathyroid adenoma.  Would recommend proceeding with surgery with plans to evaluate the left thyroid lobe and surrounding tissues for a parathyroid adenoma.  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  Will call patient this week to discuss operative strategy prior to entering orders for surgery.  tmg  Darnell Level, MD [facility] practice Office: [phone]

## 2022-11-17 ENCOUNTER — Ambulatory Visit: Payer: Self-pay | Admitting: Surgery

## 2022-11-17 ENCOUNTER — Inpatient Hospital Stay: Admit: 2022-11-17 | Primary: Family

## 2022-11-17 ENCOUNTER — Inpatient Hospital Stay: Admit: 2022-11-17 | Payer: PRIVATE HEALTH INSURANCE | Primary: Family

## 2022-11-17 ENCOUNTER — Inpatient Hospital Stay: Admit: 2022-11-17 | Discharge: 2022-11-26 | Payer: PRIVATE HEALTH INSURANCE | Primary: Family

## 2022-11-17 DIAGNOSIS — Z01818 Encounter for other preprocedural examination: Secondary | ICD-10-CM

## 2022-11-17 LAB — EKG 12-LEAD
Atrial Rate: 84 {beats}/min
Diagnosis: NORMAL
P Axis: 70 degrees
P-R Interval: 168 ms
Q-T Interval: 368 ms
QRS Duration: 100 ms
QTc Calculation (Bazett): 434 ms
R Axis: 78 degrees
T Axis: -5 degrees
Ventricular Rate: 84 {beats}/min

## 2022-11-17 LAB — SENTARA SPECIMEN COLLECTION

## 2022-11-17 NOTE — Progress Notes (Addendum)
Subjective     Patient ID: Krista Fletcher is a 67 y.o. female.  Chief Complaint   Patient presents with   . Pre-op Exam     Surgery 6/20       HPI  Patient is here for a pre-operative exam       She is scheduled for left total hip arthroplasty with Dr. Moishe Spice for [facility] on December 03, 2022     She has a significant medical history of hypertension, obstructive sleep apnea, prediabetes, arthritis of hip, obesity     He hypertension is treated with amlodipine 10 mg and Micardis HCT 80-25 mg. Blood pressure is well controlled      She is currently on Meloxciam 15 mg for hip pain. She says he barely takes as it does not help.    She says she cut out sugar and down 12 lbs since last seen.    Sleep apnea has been good with CPAP machine.     Missed mammogram and needs rescheduling.     She denies any significant previous surgery complications. She did have a loopiness after a pain/sedative medication    Supplements: vitamin D    She denies  bleeding disorders of issues    She denies family history of sudden cardiac death    No personal medical history of coronary artery disease, kidney disease, thyroid disorder or diabetes       Allergies   Allergen Reactions   . Sulfa Antibiotics Hives       Current Outpatient Medications:   .  amLODIPine (NORVASC) 10 MG tablet, take 1 tablet by mouth every day, Disp: 90 tablet, Rfl: 1  .  aspirin 81 MG chewable tablet, Chew 81 mg daily, Disp: , Rfl:   .  hydrochlorothiazide (HYDRODIURIL) 25 MG tablet, Take 1 tablet (25 mg total) by mouth daily, Disp: 90 tablet, Rfl: 1  .  meloxicam (MOBIC) 15 MG tablet, TAKE 1 TABLET (15 MG TOTAL) BY MOUTH DAILY., Disp: 90 tablet, Rfl: 1  .  telmisartan (MICARDIS) 80 MG tablet, Take 1 tablet (80 mg total) by mouth daily, Disp: 90 tablet, Rfl: 1  .  Vitamin D, Cholecalciferol, 1000 units capsule, Take by mouth. (Patient not taking: Reported on 10/20/2022), Disp: , Rfl:   Past Medical History:   Diagnosis Date   .  History of COVID-19    . Hypertension      Family History   Problem Relation Age of Onset   . Other Mother         blood cancer   . Breast cancer Maternal Grandmother      Social History     Tobacco Use   . Smoking status: Never   . Smokeless tobacco: Never   Substance Use Topics   . Alcohol use: Yes     Comment: occasional   . Drug use: No     Past Surgical History:   Procedure Laterality Date   . ADENOIDECTOMY  2013   . OOPHORECTOMY  2005    unilat   . OVARIAN CYST REMOVAL  2005     Health Maintenance   Topic Date Due   . Cervical Cancer Screening  06/15/2016   . Osteoporosis Screening  Never done   . Mammogram  04/24/2022   . Colorectal Cancer Screening  06/15/2022   . Zoster Vaccines (1 of 2) 04/21/2023 (Originally 02/14/2007)   . COVID-19 Vaccine (9 - 2023-24 season) 11/17/2023 (Originally 05/26/2022)   .  Depression Screening  10/20/2023   . Tetanus Vaccine Every 10 Years  05/13/2027   . HD Influenza Vaccine  Completed   . Pneumococcal: 65+ yrs  Completed   . Hepatitis C Screening  Completed   . Fall Risk Screening  Completed     Immunization History   Administered Date(s) Administered   . INFLUENZA QUAD 04/09/2016, 04/19/2018, 03/18/2019, 03/02/2020, 03/28/2021   . INFLUENZA QUAD 45YR+ 04/06/2013, 03/23/2016   . INFLUENZA QUAD 23YR+ 04/09/2015, 04/19/2017, 03/16/2019, 03/18/2019, 03/02/2020   . Influenza High Dose, Quad 03/13/2022   . MODERNA MRNA BIVALENT 12+yrs 06/20/2021   . MODERNA-SARS-CoV-2-MRNA 12+yrs 07/05/2019, 07/12/2019, 08/02/2019, 08/10/2019, 04/07/2020, 12/30/2020, 03/31/2022   . Pneumococcal Conjugate 20-Valent 04/22/2022   . Tdap 05/12/2017         Review of Systems   Constitutional:  Negative for fatigue and fever.   HENT:  Negative for congestion, rhinorrhea and sinus pain.    Respiratory:  Negative for cough, chest tightness and shortness of breath.    Cardiovascular:  Negative for chest pain, palpitations and leg swelling.   Gastrointestinal:  Negative for constipation, diarrhea, heartburn,  nausea and vomiting.   Genitourinary:  Negative for difficulty urinating, frequency and urgency.   Musculoskeletal:  Positive for arthralgias (left hip) and gait problem.   Neurological:  Negative for dizziness, light-headedness and headaches.   Psychiatric/Behavioral:  Negative for suicidal ideas. The patient is not nervous/anxious.        Objective    Vitals:    11/17/22 1031   BP: 128/70   Pulse: 64   Resp: 16   Temp: 98.6 F (37 C)   SpO2: 97%       Physical Exam  Vitals reviewed.   Constitutional:       Appearance: Normal appearance. She is well-developed. She is obese.   HENT:      Head: Normocephalic and atraumatic.      Right Ear: Tympanic membrane, ear canal and external ear normal. There is no impacted cerumen.      Left Ear: Tympanic membrane, ear canal and external ear normal. There is no impacted cerumen.      Nose: Nose normal. No congestion.      Right Sinus: No maxillary sinus tenderness or frontal sinus tenderness.      Left Sinus: No maxillary sinus tenderness or frontal sinus tenderness.      Mouth/Throat:      Mouth: Mucous membranes are moist.      Pharynx: Oropharynx is clear. No oropharyngeal exudate.   Eyes:      Conjunctiva/sclera: Conjunctivae normal.      Pupils: Pupils are equal, round, and reactive to light.   Cardiovascular:      Rate and Rhythm: Normal rate and regular rhythm.      Heart sounds: Normal heart sounds. No murmur heard.     No friction rub. No gallop.   Pulmonary:      Effort: Pulmonary effort is normal. No respiratory distress.      Breath sounds: Normal breath sounds. No wheezing or rales.   Chest:      Chest wall: No tenderness.   Abdominal:      General: Bowel sounds are normal.      Palpations: Abdomen is soft. There is no mass.      Tenderness: There is no abdominal tenderness.   Musculoskeletal:         General: No tenderness. Normal range of motion.      Cervical back: Normal  range of motion and neck supple.   Lymphadenopathy:      Head:      Right side of head:  No submental, submandibular, tonsillar, preauricular, posterior auricular or occipital adenopathy.      Left side of head: No submental, submandibular, tonsillar, preauricular, posterior auricular or occipital adenopathy.   Skin:     General: Skin is warm and dry.      Coloration: Skin is not pale.      Findings: No erythema or rash.   Neurological:      Mental Status: She is alert and oriented to person, place, and time.      Cranial Nerves: No cranial nerve deficit.      Motor: No abnormal muscle tone.      Coordination: Coordination normal.      Gait: Gait abnormal (uses cane).   Psychiatric:         Mood and Affect: Mood normal.         Behavior: Behavior normal.         Thought Content: Thought content normal.         Judgment: Judgment normal.             Results for orders placed or performed in visit on 04/14/22   Vitamin D 25 Hydroxy   Result Value Ref Range    VITAMIN D 25-HYDROXY 55.0 32.0 - 100.0 ng/mL   T4, free   Result Value Ref Range    THYROXINE FREE 1.2 0.9 - 1.8 ng/dL   TSH   Result Value Ref Range    THYROID STIMULATING HORMONE 2.56 0.27 - 4.20 mcU/mL   Lipid Panel   Result Value Ref Range    CHOLESTEROL 175 110 - 200 mg/dL    TRIGLYCERIDE 161 40 - 149 mg/dL    HDL CHOLESTEROL 46 >=09 mg/dL    Cholesterol/HDL 3.8 0.0 - 5.0    non-HDL Cholesterol 129 <130 mg/dL    LDL 604 (H) 50 - 99 mg/dL    VLDL CALCULATION 26 8 - 30 mg/dL    LDL/HDL Ratio 2.2    Hemoglobin A1c   Result Value Ref Range    HEMOGLOBIN A1C 5.9 (H) 4.8 - 5.6 %    ESTIMATED AVERAGE GLUCOSE 122 91 - 123 mg/dL   Comprehensive Metabolic Panel   Result Value Ref Range    POTASSIUM 3.9 3.5 - 5.5 mmol/L    SODIUM 141 133 - 145 mmol/L    CHLORIDE 99 98 - 110 mmol/L    GLUCOSE 82 70 - 99 mg/dL    CALCIUM 9.5 8.4 - 54.0 mg/dL    ALBUMIN 4.4 3.5 - 5.0 g/dL    SGPT(ALT) 21 5 - 40 U/L    SGOT(AST) 18 10 - 37 U/L    BILIRUBIN TOTAL 0.4 0.2 - 1.2 mg/dL    ALKALINE PHOSPHATASE 86 40 - 120 U/L    BUN 14 6 - 22 mg/dL    CARBON DIOXIDE 28 20 - 32  mmol/L    CREATININE 0.9 0.8 - 1.4 mg/dL    GFR >98.1 >19.1 YN/WGN/5.62 sq.m.    GLOBULIN 4.4 (H) 2.0 - 4.0 g/dL    A/G RATIO 1.0 (L) 1.1 - 2.6 ratio    TOT PROTEIN 8.8 (H) 6.2 - 8.1 g/dL    ANION GAP 13.0 3.0 - 15.0 mmol/L   CBC with Differential   Result Value Ref Range    WBC 9.0 4.0 - 11.0 K/uL    RBC 4.79  3.80 - 5.20 M/uL    HEMOGLOBIN 12.7 11.7 - 16.1 g/dL    HCT 16.0 10.9 - 32.3 %    MCV 83 80 - 99 fL    MCH 27 26 - 34 pg    MCHC 32 31 - 36 g/dL    RDW 55.7 32.2 - 02.5 %    PLATELET 388 140 - 440 K/uL    MPV 10.8 9.0 - 13.0 fL    SEGS 58 40 - 75 %    LYMPHS 30 20 - 45 %    MONOS 7 3 - 12 %    EOS 4 0 - 6 %    BASOS 0 0 - 2 %    ABSOLUTE NEUTROPHIL COUNT 5.2 1.8 - 7.7 K/uL    ABSOLUTE LYMPHOCYTE COUNT 2.7 1.0 - 4.8 K/uL    ABSOLUTE MONOCYTE COUNT 0.6 0.1 - 1.0 K/uL    ABSOLUTE EOSINOPHIL COUNT 0.4 0.0 - 0.5 K/uL    ABSOLUTE BASOPHIL COUNT 0.0 0.0 - 0.2 K/uL       Assessment/Plan         Diagnoses and all orders for this visit:    Pre-operative examination -new  Physical exam performed  She has had EKG performed at [facility] and normal sinus rhythm with marked ST abnormality  Functional capacity > 4 METS. She does not require further cardiac risk stratification    She will hold NSAIDs/supplements 7 days prior to procedure. She will discuss if they would like her to continue aspirin 81 mg.     Addendum 11/20/2022: results and chest x-ray reviewed. Patient can continue with surgery. Chest x-ray no acute findings. Prediabetes 5.8%, egfr 59.3%, urine drug screen negative, protime/inr in goal range.     Arthritis of left hip - unchanged  She will stop meloxicam as it is not helpful     Primary hypertension - unchanged   Blood pressure optimal  She will continue medications day of procedure unless surgeon suggests otherwise                                 Dr. Bonner Puna   has collaborated and participated in patient's H and P and is in agreement with the management, assessment and treatment plan of patient's  care.    Electronically Signed By: Isidore Moos, FNP 11/17/2022 12:04 PM

## 2022-11-17 NOTE — Progress Notes (Signed)
Telephone call to patient to discuss operative strategy.  Plan minimally invasive approach to left side with parathyroidectomy.  Discussed possible left thyroid lobectomy, possible neck exploration of right side, and possible overnight stay.  Discussed potential need for thyroid hormone if lobectomy required.  Patient understands and agrees to proceed.  Will send orders to schedulers.  Darnell Level, MD [facility] practice Office: [phone]

## 2022-11-18 ENCOUNTER — Other Ambulatory Visit: Payer: 59

## 2022-11-18 LAB — HEMOGLOBIN A1C
Estimated Avg Glucose, External: 121 mg/dL (ref 91–123)
Hemoglobin A1C, External: 5.8 % — ABNORMAL HIGH (ref 4.8–5.6)

## 2022-11-20 ENCOUNTER — Encounter

## 2022-11-20 NOTE — Patient Instructions (Signed)
SURGICAL WAITING ROOM VISITATION  Patients having surgery or a procedure may have no more than 2 support people in the waiting area - these visitors may rotate.    Children under the age of 22 must have an adult with them who is not the patient.  Due to an increase in RSV and influenza rates and associated hospitalizations, children ages 84 and under may not visit patients in [facility] hospitals.  If the patient needs to stay at the hospital during part of their recovery, the visitor guidelines for inpatient rooms apply. Pre-op nurse will coordinate an appropriate time for 1 support person to accompany patient in pre-op.  This support person may not rotate.    Please refer to the [facility] website for the visitor guidelines for Inpatients (after your surgery is over and you are in a regular room).       Your procedure is scheduled on: 12/03/22   Report to [facility] Main Entrance    Report to admitting at 7:45 AM   Call this number if you have problems the morning of surgery [phone]   Do not eat food :After Midnight.   After Midnight you may have the following liquids until  7 AM DAY OF SURGERY  Water Non-Citrus Juices (without pulp, NO RED-Apple, White grape, White cranberry) Black Coffee (NO MILK/CREAM OR CREAMERS, sugar ok)  Clear Tea (NO MILK/CREAM OR CREAMERS, sugar ok) regular and decaf                             Plain Jell-O (NO RED)                                           Fruit ices (not with fruit pulp, NO RED)                                     Popsicles (NO RED)                                                               Sports drinks like Gatorade (NO RED)                   Oral Hygiene is also important to reduce your risk of infection.                                    Remember - BRUSH YOUR TEETH THE MORNING OF SURGERY WITH YOUR REGULAR TOOTHPASTE  DENTURES WILL BE REMOVED PRIOR TO SURGERY PLEASE DO NOT APPLY "Poly grip" OR  ADHESIVES!!!   Do NOT smoke after Midnight   Take these medicines the morning of surgery with A SIP OF WATER: Tylenol, Atorvastatin(Lipitor), Bisoprolol(Zebeta), Estradiol(Estrace)  Bring CPAP mask and tubing day of surgery.                              You may not have any metal on  your body including hair pins, jewelry, and body piercing             Do not wear make-up, lotions, powders, perfumes, or deodorant  Do not wear nail polish including gel and S&S, artificial/acrylic nails, or any other type of covering on natural nails including finger and toenails. If you have artificial nails, gel coating, etc. that needs to be removed by a nail salon please have this removed prior to surgery or surgery may need to be canceled/ delayed if the surgeon/ anesthesia feels like they are unable to be safely monitored.   Do not shave  48 hours prior to surgery.                Do not bring valuables to the hospital. [facility] IS NOT             RESPONSIBLE   FOR VALUABLES.   Contacts, glasses, dentures or bridgework may not be worn into surgery.  DO NOT BRING YOUR HOME MEDICATIONS TO THE HOSPITAL. PHARMACY WILL DISPENSE MEDICATIONS LISTED ON YOUR MEDICATION LIST TO YOU DURING YOUR ADMISSION IN THE HOSPITAL!    Patients discharged on the day of surgery will not be allowed to drive home.  Someone NEEDS to stay with you for the first 24 hours after anesthesia.   Special Instructions: Bring a copy of your healthcare power of attorney and living will documents the day of surgery if you haven't scanned them before.              Please read over the following fact sheets you were given: IF YOU HAVE QUESTIONS ABOUT YOUR PRE-OP INSTRUCTIONS PLEASE CALL [phone]   If you received a COVID test during your pre-op visit  it is requested that you wear a mask when out in public, stay away from anyone that may not be feeling well and notify your surgeon if you develop symptoms. If you test positive for  Covid or have been in contact with anyone that has tested positive in the last 10 days please notify you surgeon.    [facility] - Preparing for Surgery Before surgery, you can play an important role.  Because skin is not sterile, your skin needs to be as free of germs as possible.  You can reduce the number of germs on your skin by washing with CHG (chlorahexidine gluconate) soap before surgery.  CHG is an antiseptic cleaner which kills germs and bonds with the skin to continue killing germs even after washing. Please DO NOT use if you have an allergy to CHG or antibacterial soaps.  If your skin becomes reddened/irritated stop using the CHG and inform your nurse when you arrive at Short Stay. Do not shave (including legs and underarms) for at least 48 hours prior to the first CHG shower.  You may shave your face/neck.  Please follow these instructions carefully:  1.  Shower with CHG Soap the night before surgery and the  morning of surgery.  2.  If you choose to wash your hair, wash your hair first as usual with your normal  shampoo.  3.  After you shampoo, rinse your hair and body thoroughly to remove the shampoo.                             4.  Use CHG as you would any other liquid soap.  You can apply chg directly to the skin and wash.  Gently with a scrungie or clean washcloth.  5.  Apply the CHG Soap to your body ONLY FROM THE NECK DOWN.   Do   not use on face/ open                           Wound or open sores. Avoid contact with eyes, ears mouth and   genitals (private parts).                       Wash face,  Genitals (private parts) with your normal soap.             6.  Wash thoroughly, paying special attention to the area where your    surgery  will be performed.  7.  Thoroughly rinse your body with warm water from the neck down.  8.  DO NOT shower/wash with your normal soap after using and rinsing off the CHG Soap.                9.  Pat yourself dry with a clean towel.            10.   Wear clean pajamas.            11.  Place clean sheets on your bed the night of your first shower and do not  sleep with pets. Day of Surgery : Do not apply any lotions/deodorants the morning of surgery.  Please wear clean clothes to the hospital/surgery center.  FAILURE TO FOLLOW THESE INSTRUCTIONS MAY RESULT IN THE CANCELLATION OF YOUR SURGERY  PATIENT SIGNATURE_________________________________  NURSE SIGNATURE__________________________________  ________________________________________________________________________

## 2022-11-20 NOTE — Progress Notes (Signed)
COVID Vaccine received:  []  No [x]  Yes Date of any COVID positive Test in last 90 days:  PCP - Eustaquio Boyden MD Cardiologist -   Chest x-ray - 02/25/21 EPIC EKG -  02/10/21 EPIC Stress Test -  ECHO -  Cardiac Cath -   Bowel Prep - []  No  []   Yes ______  Pacemaker / ICD device []  No []  Yes   Spinal Cord Stimulator:[]  No []  Yes       History of Sleep Apnea? []  No []  Yes   CPAP used?- []  No []  Yes    Does the patient monitor blood sugar?          []  No []  Yes  []  N/A  Patient has: []  NO Hx DM   []  Pre-DM                 []  DM1  []   DM2 Does patient have a [employer] or [employer]? []  No []  Yes   Fasting Blood Sugar Ranges-  Checks Blood Sugar _____ times a day  GLP1 agonist / usual dose -  GLP1 instructions:  SGLT-2 inhibitors / usual dose -  SGLT-2 instructions:   Blood Thinner / Instructions: Aspirin Instructions:  Comments:   Activity level: Patient is able / unable to climb a flight of stairs without difficulty; []  No CP  []  No SOB, but would have ___   Patient can / can not perform ADLs without assistance.   Anesthesia review:   Patient denies shortness of breath, fever, cough and chest pain at PAT appointment.  Patient verbalized understanding and agreement to the Pre-Surgical Instructions that were given to them at this PAT appointment. Patient was also educated of the need to review these PAT instructions again prior to his/her surgery.I reviewed the appropriate phone numbers to call if they have any and questions or concerns.

## 2022-11-23 ENCOUNTER — Other Ambulatory Visit: Payer: Self-pay

## 2022-11-23 ENCOUNTER — Encounter (HOSPITAL_COMMUNITY): Payer: Self-pay | Admitting: Surgery

## 2022-11-23 ENCOUNTER — Encounter (HOSPITAL_COMMUNITY)
Admission: RE | Admit: 2022-11-23 | Discharge: 2022-11-23 | Disposition: A | Discharge status: Home / Self Care | Payer: 59 | Source: Ambulatory Visit | Attending: Surgery | Admitting: Surgery

## 2022-11-23 ENCOUNTER — Encounter (HOSPITAL_COMMUNITY): Payer: Self-pay

## 2022-11-23 VITALS — BP 154/84 | HR 58 | Temp 97.9°F | Resp 16 | Ht 62.0 in | Wt 137.0 lb

## 2022-11-23 DIAGNOSIS — R001 Bradycardia, unspecified: Secondary | ICD-10-CM | POA: Diagnosis not present

## 2022-11-23 DIAGNOSIS — Z01818 Encounter for other preprocedural examination: Secondary | ICD-10-CM | POA: Insufficient documentation

## 2022-11-23 DIAGNOSIS — I10 Essential (primary) hypertension: Secondary | ICD-10-CM | POA: Diagnosis not present

## 2022-11-23 LAB — CBC
HCT: 42.6 % (ref 36.0–46.0)
Hemoglobin: 14.4 g/dL (ref 12.0–15.0)
MCH: 30.3 pg (ref 26.0–34.0)
MCHC: 33.8 g/dL (ref 30.0–36.0)
MCV: 89.7 fL (ref 80.0–100.0)
Platelets: 223 10*3/uL (ref 150–400)
RBC: 4.75 MIL/uL (ref 3.87–5.11)
RDW: 13.2 % (ref 11.5–15.5)
WBC: 4 10*3/uL (ref 4.0–10.5)
nRBC: 0 % (ref 0.0–0.2)

## 2022-11-23 LAB — BASIC METABOLIC PANEL
Anion gap: 7 (ref 5–15)
BUN: 17 mg/dL (ref 8–23)
CO2: 24 mmol/L (ref 22–32)
Calcium: 10.3 mg/dL (ref 8.9–10.3)
Chloride: 108 mmol/L (ref 98–111)
Creatinine, Ser: 1.09 mg/dL — ABNORMAL HIGH (ref 0.44–1.00)
GFR, Estimated: 56 mL/min — ABNORMAL LOW (ref >60)
Glucose, Bld: 94 mg/dL (ref 70–99)
Potassium: 4 mmol/L (ref 3.5–5.1)
Sodium: 139 mmol/L (ref 135–145)

## 2022-11-23 NOTE — H&P (Signed)
REFERRING PHYSICIAN: Eustaquio Boyden, MD  PROVIDER: Shavana Calder Myra Rude, MD   Chief Complaint: New Consultation (Primary hyperparathyroidism)  History of Present Illness:  Patient is referred by Dr. Eustaquio Boyden for surgical evaluation and recommendations regarding suspected primary hyperparathyroidism. Patient was noted on routine laboratory testing to have an elevated serum calcium level. Her recent level was elevated at 11.4. Intact PTH level was elevated at 85. 24-hour urine collection for calcium was in the upper range of normal at 204. 25-hydroxy vitamin D level was normal at 34.08. Patient had noted some fatigue. She notes bone and joint discomfort. She notes some urinary frequency. She denies nephrolithiasis. Bone density scanning has been normal. Patient has had no prior head or neck surgery. There is no family history of parathyroid disease or other endocrine neoplasm. Patient presents today accompanied by her husband.  Review of Systems: A complete review of systems was obtained from the patient. I have reviewed this information and discussed as appropriate with the patient. See HPI as well for other ROS.  Review of Systems  Constitutional: Positive for malaise/fatigue.  HENT: Negative.  Eyes: Negative.  Respiratory: Negative.  Cardiovascular: Negative.  Gastrointestinal: Negative.  Genitourinary: Positive for frequency.  Musculoskeletal: Positive for joint pain.  Skin: Negative.  Neurological: Negative.  Endo/Heme/Allergies: Negative.  Psychiatric/Behavioral: Negative.    Medical History: Past Medical History:  Diagnosis Date  Anxiety   Patient Active Problem List  Diagnosis  Primary hyperparathyroidism (CMS/HHS-HCC)   Past Surgical History:  Procedure Laterality Date  COMBINED REDUCTION MAMMAPLASTY W/ ABDOMINOPLASTY  HYSTERECTOMY    No Known Allergies  Current Outpatient Medications on File Prior to Visit  Medication Sig Dispense Refill   acetaminophen (TYLENOL) 500 MG tablet Take 500 mg by mouth  atorvastatin (LIPITOR) 20 MG tablet Take 20 mg by mouth every morning  benazepriL (LOTENSIN) 20 MG tablet Take 20 mg by mouth once daily  bisoprolol (ZEBETA) 5 MG tablet Take 5 mg by mouth once daily  diclofenac (VOLTAREN) 1 % topical gel Apply 2 g topically 4 (four) times daily  estradioL (ESTRACE) 0.5 MG tablet Take by mouth  hydrOXYzine (ATARAX) 25 MG tablet Take by mouth  methocarbamoL (ROBAXIN) 500 MG tablet Take 500 mg by mouth at bedtime  spironolactone (ALDACTONE) 25 MG tablet Take 1 tablet by mouth once daily   No current facility-administered medications on file prior to visit.   Family History  Problem Relation Age of Onset  Skin cancer Mother  High blood pressure (Hypertension) Mother  Hyperlipidemia (Elevated cholesterol) Mother  Skin cancer Father  High blood pressure (Hypertension) Father  Hyperlipidemia (Elevated cholesterol) Father    Social History   Tobacco Use  Smoking Status Never  Smokeless Tobacco Never    Social History   Socioeconomic History  Marital status: Married  Tobacco Use  Smoking status: Never  Smokeless tobacco: Never  Substance and Sexual Activity  Alcohol use: Not Currently  Drug use: Never   Objective:   Vitals:  BP: (!) 162/90  Pulse: 56  Temp: 36.7 C (98 F)  SpO2: 95%  Weight: 64.9 kg (143 lb)  Height: 157.5 cm (5\' 2" )   Body mass index is 26.16 kg/m.  Physical Exam   GENERAL APPEARANCE Comfortable, no acute issues Development: normal Gross deformities: none  SKIN Rash, lesions, ulcers: none Induration, erythema: none Nodules: none palpable  EYES Conjunctiva and lids: normal Pupils: equal and reactive  EARS, NOSE, MOUTH, THROAT External ears: no lesion or deformity External nose: no lesion  or deformity Hearing: grossly normal  NECK Symmetric: no Trachea: midline Thyroid: Right thyroid lobe is normal to palpation. Left thyroid lobe has a  small relatively soft nodule measuring approximately 1.5 cm in size and extending into the isthmus. It is smooth, mobile, and nontender. There is no associated lymphadenopathy.  CHEST Respiratory effort: normal Retraction or accessory muscle use: no Breath sounds: normal bilaterally Rales, rhonchi, wheeze: none  CARDIOVASCULAR Auscultation: regular rhythm, normal rate Murmurs: none Pulses: radial pulse 2+ palpable Lower extremity edema: none  ABDOMEN Not assessed  GENITOURINARY/RECTAL Not assessed  MUSCULOSKELETAL Station and gait: normal Digits and nails: no clubbing or cyanosis Muscle strength: grossly normal all extremities Range of motion: grossly normal all extremities Deformity: none  LYMPHATIC Cervical: none palpable Supraclavicular: none palpable  PSYCHIATRIC Oriented to person, place, and time: yes Mood and affect: normal for situation Judgment and insight: appropriate for situation   Assessment and Plan:   Primary hyperparathyroidism (CMS/HHS-HCC)  Patient is referred by her primary care physician for surgical evaluation and recommendations regarding suspected primary hyperparathyroidism.  Patient provided with a copy of "Parathyroid Surgery: Treatment for Your Parathyroid Gland Problem", published by [employer], 12 pages. Book reviewed and explained to patient during visit today.  Today we reviewed her clinical history. We reviewed her recent laboratory studies. I would like to proceed with further evaluation to include an ultrasound examination of the neck as well as a nuclear medicine parathyroid scan with sestamibi. The studies will localize a parathyroid adenoma and confirm the diagnosis approximately 80% of the time. If they do, the patient may be a good candidate for minimally invasive outpatient surgery for parathyroidectomy. We discussed the procedure. We discussed the size and location of the surgical incision. We discussed the postoperative recovery. We  discussed the risk and benefits including the risk of recurrent laryngeal nerve injury. Most of the time patients have single gland disease.  If the studies do not localize an adenoma, then we will consider proceeding with a 4D CT scan of the neck with parathyroid protocol.  Patient will undergo the above studies. We will contact her with those results when they are available and make a decision regarding further management at that time.   Darnell Level, MD [facility] practice Office: [phone]

## 2022-11-26 ENCOUNTER — Ambulatory Visit
Admission: RE | Admit: 2022-11-26 | Discharge: 2022-11-26 | Disposition: A | Discharge status: Home / Self Care | Payer: 59 | Source: Ambulatory Visit | Attending: Family Medicine | Admitting: Family Medicine

## 2022-11-26 ENCOUNTER — Encounter: Payer: Self-pay | Admitting: Family Medicine

## 2022-11-26 DIAGNOSIS — Z1231 Encounter for screening mammogram for malignant neoplasm of breast: Secondary | ICD-10-CM

## 2022-11-30 ENCOUNTER — Ambulatory Visit: Admit: 2022-11-30 | Discharge: 2022-11-30 | Payer: PRIVATE HEALTH INSURANCE | Primary: Family

## 2022-11-30 DIAGNOSIS — M1632 Unilateral osteoarthritis resulting from hip dysplasia, left hip: Secondary | ICD-10-CM

## 2022-11-30 NOTE — Progress Notes (Signed)
Patient: Krista Fletcher                MRN: [id number]       SSN: [id number]  Date of Birth: [birth year]        AGE: 67 y.o.        SEX: female  BMI: Body mass index is 47.07 kg/m.    PCP: Isidore Moos, APRN - NP  11/30/22    Chief Complaint: H&P (Left hip )      1. 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 Yes, change in SS = 21       PMH Obesity (46), HTN, pre-diabetes   Meds amlodipine, baby aspirin, micardis (telmisartan-HCTZ)    Allergies  Sulfa antibiotics    Labs Hgb: 12.7  Plt: 388  Cr: 0.9  GFR: 59 (all other labs show >60- no history of kidney issues, discussed emphasizing hydration up until surgery and refraining from taking meloxicam and other nephrotoxic medications)  Alb: 4.4  A1c: 5.9  UA: neg  UDS: neg  Nicotine: neg     Images EKG, CXR   [facility] [address], [employer]  Sent? no     Surgery was discussed with the patient today.  They have failed conservative management of their pathology. The risks and benefits of surgical and conservative (nonsurgical) treatment were discussed at length.  The risks of surgery include but are not limited to pain, scar, infection, painful hardware, hardware failure, fracture, instability, weakness, stiffness, Deep Veinous Thrombosis/Pulmonary Embolism, anesthetic risks including heart attack/stroke, injury to nerves and/or blood vessels, bleeding, the need for further surgery and death.  In the case of fracture repair, the risks also include nonunion or malunion. The recovery from surgery was also discussed at length.  All of the patient's questions were entertained and answered.  Understanding the diagnosis, risks and benefits of surgical treatment, the patient wishes to proceed with surgery.   2. Pre-op  exam  -     AMB POC XRAY, SPINE, LUMBOSACRAL; 2 O  -     AMB POC X-RAY RADEX HIP UNI WITH PELVIS 2-3 VIEWS  3. Morbid obesity (HCC)  4. Hypertension, unspecified type  5. Pre-diabetes        HPI:  Krista Fletcher is a 67 y.o. female with chief complaint of   Chief Complaint   Patient presents with    H&P     Left hip      Left hip pain for several years that used to do well with intra-articular corticosteroid injections bilateral is no longer work.    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 She has severe degeneration and status failed conservative treatment with intra-articular corticosteroid injections, physical therapy, Tylenol, ibuprofen. She can take the occasional Tylenol and ibuprofen which takes the edge off. The first 2 corticosteroid injection she got did help her but subsequently they have not. 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 Her BMI is currently 50 and she weighs 255 pounds. We set a goal weight loss for 30 pounds and will refer her to the bariatric  program for weight loss. I like to see her in 2 months to check on her progress and if she has made good progress towards the weight loss goal we will schedule her for left total hip arthroplasty.       IMAGING:  Imaging read by myself and interpreted as follows:    November 30, 2022:  3 view x-ray of the left hip including AP pelvis, AP, and lateral demonstrates severe degenerative changes of the left hip with dysplasia. There is evidence of subchondral sclerosis and subchondral cyst formation through out the femoral head and acetabulum with large osteophytes at the superior and inferior acetabular rim.  Large cyst and osteophyte present at the superior aspect of the head neck junction. The left leg appears to be about 3 cm shorter than the right.     2 view x-ray of the lumbar spine including sitting and  standing demonstrates a change in SS of 21.    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  There is deformation of the femoral head with subchondral sclerosis, subchondral cysts in both the acetabulum and the femoral head.  There are large osteophytes.  The left leg is approximately 3 cm shorter than the right.      PHYSICAL EXAMINATION:  BP 136/78 (Site: Left Upper Arm, Position: Sitting)   Ht 1.524 m (5')   Wt 109.3 kg (241 lb)   BMI 47.07 kg/m   Body mass index is 47.07 kg/m.  Wt Readings from Last 3 Encounters:   11/30/22 109.3 kg (241 lb)   07/24/22 108.9 kg (240 lb)   02/20/22 115.7 kg (255 lb)       GENERAL: Alert and oriented x3, in no acute distress.  HEENT: Normocephalic, atraumatic.    MSK: Left Hip Exam     Tenderness   The patient is experiencing no tenderness.     Range of Motion   Flexion:  100   External rotation:  30   Internal rotation: 0     Muscle Strength   Abduction: 5/5   Adduction: 5/5   Flexion: 5/5     Other   Erythema: absent  Scars: absent  Sensation: normal  Pulse: present    Comments:  Positive FADIR test           ASSESSMENT and PLAN:  November 30, 2022:  The patient originally wanted her case done at the surgery center but due to the complexity of her hip, it will need to be done a [facility]. This was discussed with the patient today and she agreed to move forward at [facility] for the surgery.    The patient was previously indicated for surgery by Dr. Lorin Picket and has completed all of their requested preop testing and clearances. The patient has failed all conservative treatment and would like to proceed with having surgery. Based on the patient's co morbidities of obesity (BMI 46- she did not lose the recommended 15 pounds), HTN, and prediabetes, after reviewing all of the required pre-operative labs and imaging, including  CXR and EKG, I believe the patient is optimized to procedure with surgery.     The patient and I reviewed their home medications and they were educated on what to take and not to take leading up to their procedure.  They have allergies to SULFA ABX.  Post operative medications were also discussed with the patient, including:  GI side effects including N/V/D, and risk of possible stomach ulcers with meloxicam and aspirin.   risk of constipation, nausea/ vomiting, dizziness with tramadol and oxycodone and that they should wait at least an hour between taking the tramadol and oxycodone as they can increase the chance of respiratory depression when combined.  That they should not take more than 4000 mg of tylenol in one day as it can have negative impacts on the liver at too high of doses and possible toxicity.  All of the patient's questions were answered and they verbalized understanding to the above information.    [facility] [place], these were NOT sent today.      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  Patient is been working with her primary care provider on weight loss using water aerobics and diet.  She is managing her pain with occasional meloxicam and Voltaren gel as well as activity modification.  She has been lose 15 pounds recently.  I like her to lose an additional 15 pounds but we will start planning for a left total hip arthroplasty through a posterior lateral approach.  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 She has severe degeneration and status failed conservative treatment with intra-articular corticosteroid injections, physical therapy, Tylenol, ibuprofen. She can take the occasional Tylenol and ibuprofen which takes the edge off. The first  2 corticosteroid injection she got did help her but subsequently they have not. 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 Her BMI is currently 50 and she weighs 255 pounds. We set a goal weight loss for 30 pounds and will refer her to the bariatric program for weight loss. I like to see her in 2 months to check on her progress and if she has made good progress towards the weight loss goal we will schedule her for left total hip arthroplasty.         02/20/2022     3:19 PM   AMB PAIN ASSESSMENT   Location of Pain Hip   Location Modifiers Left   Severity of Pain 6     Tobacco Use: Low Risk  (11/30/2022)    Patient History     Smoking Tobacco Use: Never     Smokeless Tobacco Use: Never     Passive Exposure: Never         Past Medical History:   Diagnosis Date    Hypertension        Family History   Problem Relation Age of Onset    No Known Problems Mother     No Known Problems Father        Current Outpatient Medications   Medication Sig Dispense Refill    brimonidine-timolol (COMBIGAN) 0.2-0.5 % ophthalmic solution Combigan 0.2 %-0.5 % eye drops      amLODIPine (NORVASC) 10 MG tablet amlodipine 10 mg tablet      latanoprost (XALATAN) 0.005 % ophthalmic solution latanoprost 0.005 % eye drops      meloxicam (MOBIC) 15 MG tablet meloxicam 15 mg tablet      telmisartan-hydroCHLOROthiazide (MICARDIS HCT) 80-25 MG per tablet telmisartan 80 mg-hydrochlorothiazide 25  mg tablet       No current facility-administered medications for this visit.        Allergies   Allergen Reactions    Sulfa Antibiotics        No past surgical history on file.    Social History     Socioeconomic History    Marital status: Divorced     Spouse name: Not on file    Number of children: Not on file    Years of education: Not on file    Highest education level: Not on file   Occupational History    Not on file   Tobacco Use    Smoking status: Never     Passive exposure: Never    Smokeless tobacco: Never    Vaping Use    Vaping Use: Never used   Substance and Sexual Activity    Alcohol use: Not Currently    Drug use: Never    Sexual activity: Not Currently   Other Topics Concern    Not on file   Social History Narrative    Not on file     Social Determinants of Health     Financial Resource Strain: Not on file   Food Insecurity: Not on file   Transportation Needs: Not on file   Physical Activity: Not on file   Stress: Not on file   Social Connections: Not on file   Intimate Partner Violence: Not on file   Housing Stability: Not on file       REVIEW OF SYSTEMS:      Negative except for that stated above.     @APPTDATE @      Prescription medication management discussed with patient.     Electronically signed by: Clerance Lav, PA-C    Note: This note was completed using voice recognition software.  Any typographical/name errors or mistakes are unintentional.

## 2022-11-30 NOTE — Assessment & Plan Note (Addendum)
Planned Surgery Left total hip arthoplasty, posterolateral    Surgery date TBD   Clearance obtained Yes , PCP (updated clearance in media after they reviewed most resent labs and imaging)   RQ imaging complete? Yes, change in SS = 21       PMH Obesity (46), HTN, pre-diabetes   Meds amlodipine, baby aspirin, micardis (telmisartan-HCTZ)    Allergies  Sulfa antibiotics    Labs Hgb: 12.7  Plt: 388  Cr: 0.9  GFR: 59 (all other labs show >60- no history of kidney issues, discussed emphasizing hydration up until surgery and refraining from taking meloxicam and other nephrotoxic medications)  Alb: 4.4  A1c: 5.9  UA: neg  UDS: neg  Nicotine: neg     Images EKG, CXR   [facility] on [address], [employer]  Sent? no     Surgery was discussed with the patient today.  They have failed conservative management of their pathology. The risks and benefits of surgical and conservative (nonsurgical) treatment were discussed at length.  The risks of surgery include but are not limited to pain, scar, infection, painful hardware, hardware failure, fracture, instability, weakness, stiffness, Deep Veinous Thrombosis/Pulmonary Embolism, anesthetic risks including heart attack/stroke, injury to nerves and/or blood vessels, bleeding, the need for further surgery and death.  In the case of fracture repair, the risks also include nonunion or malunion. The recovery from surgery was also discussed at length.  All of the patient's questions were entertained and answered.  Understanding the diagnosis, risks and benefits of surgical treatment, the patient wishes to proceed with surgery.

## 2022-12-02 NOTE — Anesthesia Preprocedure Evaluation (Signed)
Anesthesia Evaluation  Patient identified by MRN, date of birth, ID band Patient awake    Reviewed: Allergy & Precautions, NPO status , Patient's Chart, lab work & pertinent test results  Airway Mallampati: II  TM Distance: >3 FB Neck ROM: Full    Dental no notable dental hx.    Pulmonary neg pulmonary ROS   Pulmonary exam normal breath sounds clear to auscultation       Cardiovascular hypertension, Pt. on home beta blockers Normal cardiovascular exam Rhythm:Regular Rate:Normal     Neuro/Psych  Headaches PSYCHIATRIC DISORDERS Anxiety        GI/Hepatic negative GI ROS, Neg liver ROS,,,  Endo/Other  negative endocrine ROS    Renal/GU Renal disease     Musculoskeletal  (+) Arthritis ,    Abdominal   Peds  Hematology negative hematology ROS (+)   Anesthesia Other Findings   Reproductive/Obstetrics                             Anesthesia Physical Anesthesia Plan  ASA: 2  Anesthesia Plan: General   Post-op Pain Management: Tylenol PO (pre-op)*   Induction: Intravenous  PONV Risk Score and Plan: 4 or greater and Ondansetron, Dexamethasone, Treatment may vary due to age or medical condition and Propofol infusion  Airway Management Planned: Oral ETT  Additional Equipment:   Intra-op Plan:   Post-operative Plan: Extubation in OR  Informed Consent: I have reviewed the patients History and Physical, chart, labs and discussed the procedure including the risks, benefits and alternatives for the proposed anesthesia with the patient or authorized representative who has indicated his/her understanding and acceptance.     Dental advisory given  Plan Discussed with: CRNA  Anesthesia Plan Comments:         Anesthesia Quick Evaluation

## 2022-12-03 ENCOUNTER — Other Ambulatory Visit: Payer: Self-pay

## 2022-12-03 ENCOUNTER — Ambulatory Visit (HOSPITAL_BASED_OUTPATIENT_CLINIC_OR_DEPARTMENT_OTHER): Payer: 59 | Admitting: Anesthesiology

## 2022-12-03 ENCOUNTER — Encounter (HOSPITAL_COMMUNITY)
Admission: RE | Disposition: A | Discharge status: Home / Self Care | Payer: Self-pay | Source: Home / Self Care | Attending: Surgery

## 2022-12-03 ENCOUNTER — Other Ambulatory Visit (HOSPITAL_COMMUNITY): Payer: Self-pay

## 2022-12-03 ENCOUNTER — Ambulatory Visit (HOSPITAL_COMMUNITY)
Admission: RE | Admit: 2022-12-03 | Discharge: 2022-12-03 | Disposition: A | Discharge status: Home / Self Care | Payer: 59 | Attending: Surgery | Admitting: Surgery

## 2022-12-03 ENCOUNTER — Ambulatory Visit (HOSPITAL_COMMUNITY): Payer: 59 | Admitting: Anesthesiology

## 2022-12-03 ENCOUNTER — Encounter (HOSPITAL_COMMUNITY): Payer: Self-pay | Admitting: Surgery

## 2022-12-03 DIAGNOSIS — I10 Essential (primary) hypertension: Secondary | ICD-10-CM | POA: Diagnosis not present

## 2022-12-03 DIAGNOSIS — F419 Anxiety disorder, unspecified: Secondary | ICD-10-CM

## 2022-12-03 DIAGNOSIS — M1990 Unspecified osteoarthritis, unspecified site: Secondary | ICD-10-CM | POA: Insufficient documentation

## 2022-12-03 DIAGNOSIS — Z01818 Encounter for other preprocedural examination: Secondary | ICD-10-CM

## 2022-12-03 DIAGNOSIS — E210 Primary hyperparathyroidism: Secondary | ICD-10-CM | POA: Diagnosis not present

## 2022-12-03 DIAGNOSIS — Z79899 Other long term (current) drug therapy: Secondary | ICD-10-CM | POA: Diagnosis not present

## 2022-12-03 DIAGNOSIS — D351 Benign neoplasm of parathyroid gland: Secondary | ICD-10-CM | POA: Diagnosis not present

## 2022-12-03 HISTORY — PX: PARATHYROIDECTOMY: SHX19

## 2022-12-03 SURGERY — PARATHYROIDECTOMY
Anesthesia: General | Site: Neck

## 2022-12-03 MED ORDER — MIDAZOLAM HCL 5 MG/5ML IJ SOLN
INTRAMUSCULAR | Status: DC | PRN
  Administered 2022-12-03 (×2): 1 mg via INTRAVENOUS

## 2022-12-03 MED ORDER — ONDANSETRON HCL 4 MG/2ML IJ SOLN
INTRAMUSCULAR | Status: DC | PRN
  Administered 2022-12-03: 4 mg via INTRAVENOUS

## 2022-12-03 MED ORDER — HYDROMORPHONE HCL 1 MG/ML IJ SOLN
INTRAMUSCULAR | Status: AC
  Administered 2022-12-03: 0.5 mg via INTRAVENOUS
  Filled 2022-12-03: qty 1

## 2022-12-03 MED ORDER — ACETAMINOPHEN 500 MG PO TABS
1000.0000 mg | ORAL_TABLET | Freq: Once | ORAL | Status: AC
  Administered 2022-12-03: 1000 mg via ORAL
  Filled 2022-12-03: qty 2

## 2022-12-03 MED ORDER — HYDROMORPHONE HCL 1 MG/ML IJ SOLN
INTRAMUSCULAR | Status: AC
  Administered 2022-12-03: 0.25 mg via INTRAVENOUS
  Filled 2022-12-03: qty 1

## 2022-12-03 MED ORDER — ROCURONIUM BROMIDE 100 MG/10ML IV SOLN
INTRAVENOUS | Status: DC | PRN
  Administered 2022-12-03: 60 mg via INTRAVENOUS

## 2022-12-03 MED ORDER — EPHEDRINE SULFATE (PRESSORS) 50 MG/ML IJ SOLN
INTRAMUSCULAR | Status: DC | PRN
  Administered 2022-12-03: 10 mg via INTRAVENOUS

## 2022-12-03 MED ORDER — CHLORHEXIDINE GLUCONATE CLOTH 2 % EX PADS: 6.0000 | MEDICATED_PAD | Freq: Once | CUTANEOUS | Status: DC

## 2022-12-03 MED ORDER — HEMOSTATIC AGENTS (NO CHARGE) OPTIME
TOPICAL | Status: DC | PRN
  Administered 2022-12-03: 1 via TOPICAL

## 2022-12-03 MED ORDER — LIDOCAINE HCL (PF) 2 % IJ SOLN
INTRAMUSCULAR | Status: AC
  Filled 2022-12-03: qty 5

## 2022-12-03 MED ORDER — SUGAMMADEX SODIUM 200 MG/2ML IV SOLN
INTRAVENOUS | Status: DC | PRN
  Administered 2022-12-03: 200 mg via INTRAVENOUS

## 2022-12-03 MED ORDER — DEXAMETHASONE SODIUM PHOSPHATE 10 MG/ML IJ SOLN
INTRAMUSCULAR | Status: DC | PRN
  Administered 2022-12-03: 8 mg via INTRAVENOUS

## 2022-12-03 MED ORDER — PROPOFOL 10 MG/ML IV BOLUS
INTRAVENOUS | Status: AC
  Filled 2022-12-03: qty 20

## 2022-12-03 MED ORDER — BUPIVACAINE HCL 0.25 % IJ SOLN
INTRAMUSCULAR | Status: DC | PRN
  Administered 2022-12-03: 9 mL

## 2022-12-03 MED ORDER — ORAL CARE MOUTH RINSE: 15.0000 mL | Freq: Once | OROMUCOSAL | Status: AC

## 2022-12-03 MED ORDER — DEXAMETHASONE SODIUM PHOSPHATE 10 MG/ML IJ SOLN
INTRAMUSCULAR | Status: AC
  Filled 2022-12-03: qty 1

## 2022-12-03 MED ORDER — HYDROMORPHONE HCL 1 MG/ML IJ SOLN
0.5000 mg | INTRAMUSCULAR | Status: AC | PRN
  Administered 2022-12-03: 0.5 mg via INTRAVENOUS

## 2022-12-03 MED ORDER — HYDROMORPHONE HCL 1 MG/ML IJ SOLN
0.2500 mg | INTRAMUSCULAR | Status: DC | PRN
  Administered 2022-12-03: 0.5 mg via INTRAVENOUS
  Administered 2022-12-03: 0.25 mg via INTRAVENOUS

## 2022-12-03 MED ORDER — CEFAZOLIN SODIUM-DEXTROSE 2-4 GM/100ML-% IV SOLN
2.0000 g | INTRAVENOUS | Status: AC
  Administered 2022-12-03: 2 g via INTRAVENOUS

## 2022-12-03 MED ORDER — BUPIVACAINE HCL 0.25 % IJ SOLN
INTRAMUSCULAR | Status: AC
  Filled 2022-12-03: qty 1

## 2022-12-03 MED ORDER — EPHEDRINE 5 MG/ML INJ
INTRAVENOUS | Status: AC
  Filled 2022-12-03: qty 5

## 2022-12-03 MED ORDER — CHLORHEXIDINE GLUCONATE 0.12 % MT SOLN
15.0000 mL | Freq: Once | OROMUCOSAL | Status: AC
  Administered 2022-12-03: 15 mL via OROMUCOSAL

## 2022-12-03 MED ORDER — LACTATED RINGERS IV SOLN: INTRAVENOUS | Status: DC

## 2022-12-03 MED ORDER — MIDAZOLAM HCL 2 MG/2ML IJ SOLN
INTRAMUSCULAR | Status: AC
  Filled 2022-12-03: qty 2

## 2022-12-03 MED ORDER — PROMETHAZINE HCL 25 MG/ML IJ SOLN: 6.2500 mg | INTRAMUSCULAR | Status: DC | PRN

## 2022-12-03 MED ORDER — ROCURONIUM BROMIDE 10 MG/ML (PF) SYRINGE
PREFILLED_SYRINGE | INTRAVENOUS | Status: AC
  Filled 2022-12-03: qty 10

## 2022-12-03 MED ORDER — TRAMADOL HCL 50 MG PO TABS
50.0000 mg | ORAL_TABLET | Freq: Four times a day (QID) | ORAL | 0 refills | Status: DC | PRN
  Filled 2022-12-03: qty 15, 4d supply, fill #0

## 2022-12-03 MED ORDER — AMISULPRIDE (ANTIEMETIC) 5 MG/2ML IV SOLN: 10.0000 mg | Freq: Once | INTRAVENOUS | Status: DC | PRN

## 2022-12-03 MED ORDER — CEFAZOLIN SODIUM-DEXTROSE 2-4 GM/100ML-% IV SOLN
INTRAVENOUS | Status: AC
  Filled 2022-12-03: qty 100

## 2022-12-03 MED ORDER — PROPOFOL 10 MG/ML IV BOLUS
INTRAVENOUS | Status: DC | PRN
  Administered 2022-12-03: 150 mg via INTRAVENOUS

## 2022-12-03 MED ORDER — LIDOCAINE HCL (CARDIAC) PF 100 MG/5ML IV SOSY
PREFILLED_SYRINGE | INTRAVENOUS | Status: DC | PRN
  Administered 2022-12-03: 50 mg via INTRAVENOUS

## 2022-12-03 MED ORDER — ONDANSETRON HCL 4 MG/2ML IJ SOLN
INTRAMUSCULAR | Status: AC
  Filled 2022-12-03: qty 2

## 2022-12-03 MED ORDER — OXYCODONE HCL 5 MG PO TABS
ORAL_TABLET | ORAL | Status: AC
  Filled 2022-12-03: qty 1

## 2022-12-03 MED ORDER — OXYCODONE HCL 5 MG PO TABS
5.0000 mg | ORAL_TABLET | Freq: Once | ORAL | Status: AC
  Administered 2022-12-03: 5 mg via ORAL

## 2022-12-03 MED ORDER — 0.9 % SODIUM CHLORIDE (POUR BTL) OPTIME
TOPICAL | Status: DC | PRN
  Administered 2022-12-03: 1000 mL

## 2022-12-03 MED ORDER — FENTANYL CITRATE (PF) 100 MCG/2ML IJ SOLN
INTRAMUSCULAR | Status: DC | PRN
  Administered 2022-12-03: 50 ug via INTRAVENOUS
  Administered 2022-12-03: 25 ug via INTRAVENOUS
  Administered 2022-12-03: 75 ug via INTRAVENOUS
  Administered 2022-12-03: 50 ug via INTRAVENOUS

## 2022-12-03 MED ORDER — FENTANYL CITRATE (PF) 250 MCG/5ML IJ SOLN
INTRAMUSCULAR | Status: AC
  Filled 2022-12-03: qty 5

## 2022-12-03 SURGICAL SUPPLY — 35 items

## 2022-12-03 NOTE — Op Note (Signed)
OPERATIVE REPORT - PARATHYROIDECTOMY  Preoperative diagnosis: Primary hyperparathyroidism  Postop diagnosis: Same  Procedure: Left superior minimally invasive parathyroidectomy  Surgeon:  Darnell Level, MD  Anesthesia: General endotracheal  Estimated blood loss: Minimal  Preparation: ChloraPrep  Indications: Patient is referred by Dr. Eustaquio Boyden for surgical evaluation and recommendations regarding suspected primary hyperparathyroidism. Patient was noted on routine laboratory testing to have an elevated serum calcium level. Her recent level was elevated at 11.4. Intact PTH level was elevated at 85. 24-hour urine collection for calcium was in the upper range of normal at 204. 25-hydroxy vitamin D level was normal at 34.08. Patient had noted some fatigue. She notes bone and joint discomfort. She notes some urinary frequency. She denies nephrolithiasis. Bone density scanning has been normal. Patient has had no prior head or neck surgery. There is no family history of parathyroid disease or other endocrine neoplasm. Patient presents today accompanied by her husband.   Procedure: The patient was prepared in the pre-operative holding area. The patient was brought to the operating room and placed in a supine position on the operating room table. Following administration of general anesthesia, the patient was positioned and then prepped and draped in the usual strict aseptic fashion. After ascertaining that an adequate level of anesthesia been achieved, a neck incision was made with a #15 blade. Dissection was carried through subcutaneous tissues and platysma. Hemostasis was obtained with the electrocautery. Skin flaps were developed circumferentially and a Weitlander retractor was placed for exposure.  Strap muscles were incised in the midline. Strap muscles were reflected laterally exposing the thyroid lobe. With gentle blunt dissection the thyroid lobe was mobilized.  Dissection was carried  posteriorly and an enlarged parathyroid gland was identified in the left superior position just cephalad to the inferior thyroid artery. It was gently mobilized. Vascular structures were divided between small ligaclips. Care was taken to avoid the recurrent laryngeal nerve. The parathyroid gland was completely excised. It was submitted to pathology where frozen section confirmed hypercellular parathyroid tissue consistent with adenoma.  Further exploration on the left failed to identify any further enlarged parathyroid glands, and no significant thyroid nodules were noted.  Neck was irrigated with warm saline and good hemostasis was noted. Fibrillar was placed in the operative field. Strap muscles were approximated in the midline with interrupted 3-0 Vicryl sutures. Platysma was closed with interrupted 3-0 Vicryl sutures. Marcaine was infiltrated circumferentially. Skin was closed with a running 4-0 Monocryl subcuticular suture. Wound was washed and dried and Dermabond was applied. Patient was awakened from anesthesia and brought to the recovery room. The patient tolerated the procedure well.   Darnell Level, MD [facility] Surgery Office: [phone]

## 2022-12-03 NOTE — Interval H&P Note (Signed)
**Note Jacqueline-Identified via Obfuscation** History and Physical Interval Note:  12/03/2022 10:01 AM  Jacqueline Waters  has presented today for surgery, with the diagnosis of PRIMARY HYPERPARATHYROIDISM.  The various methods of treatment have been discussed with the patient and family. After consideration of risks, benefits and other options for treatment, the patient has consented to    Procedure(s): LEFT PARATHYROIDECTOMY POSSIBLE LEFT THYROID LOBECTOMY POSSIBLE NECK EXPLORATION (N/A) as a surgical intervention.    The patient's history has been reviewed, patient examined, no change in status, stable for surgery.  I have reviewed the patient's chart and labs.  Questions were answered to the patient's satisfaction.    Darnell Level, MD [facility] practice Office: [phone]   Darnell Level

## 2022-12-03 NOTE — Anesthesia Postprocedure Evaluation (Signed)
Anesthesia Post Note  Patient: Jacqueline Waters  Procedure(s) Performed: LEFT SUPERIOR PARATHYROIDECTOMY (Neck)     Patient location during evaluation: PACU Anesthesia Type: General Level of consciousness: sedated and patient cooperative Pain management: pain level controlled Vital Signs Assessment: post-procedure vital signs reviewed and stable Respiratory status: spontaneous breathing Cardiovascular status: stable Anesthetic complications: no   No notable events documented.  Last Vitals:  Vitals:   12/03/22 1321 12/03/22 1322  BP: (!) 160/82   Pulse: (!) 52 (!) 49  Resp:  12  Temp:  (!) 36.3 C  SpO2: 97% 100%    Last Pain:  Vitals:   12/03/22 1322  TempSrc: Temporal  PainSc: 5                  Lewie Loron

## 2022-12-03 NOTE — Transfer of Care (Signed)
Immediate Anesthesia Transfer of Care Note  Patient: Jacqueline Waters  Procedure(s) Performed: LEFT SUPERIOR PARATHYROIDECTOMY (Neck)  Patient Location: PACU  Anesthesia Type:General  Level of Consciousness: awake, alert , oriented, and patient cooperative  Airway & Oxygen Therapy: Patient Spontanous Breathing and Patient connected to face mask oxygen  Post-op Assessment: Report given to RN and Post -op Vital signs reviewed and stable  Post vital signs: Reviewed and stable  Last Vitals:  Vitals Value Taken Time  BP 146/70 12/03/22 1139  Temp    Pulse 70 12/03/22 1144  Resp 21 12/03/22 1144  SpO2 100 % 12/03/22 1144  Vitals shown include unvalidated device data.  Last Pain:  Vitals:   12/03/22 0842  TempSrc:   PainSc: 0-No pain         Complications: No notable events documented.

## 2022-12-03 NOTE — Discharge Instructions (Addendum)
[facility] - Dr. Todd Gerkin  THYROID & PARATHYROID SURGERY:  POST-OP INSTRUCTIONS  Always review the instruction sheet provided by the hospital nurse at discharge.  A prescription for pain medication may be sent to your pharmacy at the time of discharge.  Take your pain medication as prescribed.  If narcotic pain medicine is not needed, then you may take acetaminophen (Tylenol) or ibuprofen (Advil) as needed for pain or soreness.  Take your normal home medications as prescribed unless otherwise directed.  If you need a refill on your pain medication, please contact the office during regular business hours.  Prescriptions will not be processed by the office after 5:00PM or on weekends.  Start with a light diet upon arrival home, such as soup and crackers or toast.  Be sure to drink plenty of fluids.  Resume your normal diet the day after surgery.  Most patients will experience some swelling and bruising on the chest and neck area.  Ice packs will help for the first 48 hours after arriving home.  Swelling and bruising will take several days to resolve.   It is common to experience some constipation after surgery.  Increasing fluid intake and taking a stool softener (Colace) will usually help to prevent this problem.  A mild laxative (Milk of Magnesia or Miralax) should be taken according to package directions if there has been no bowel movement after 48 hours.  Dermabond glue covers your incision. This seals the wound and you may shower at any time. The Dermabond will remain in place for about a week.  You may gradually remove the glue when it loosens around the edges.  If you need to loosen the Dermabond for removal, apply a layer of Vaseline to the wound for 15 minutes and then remove with a Kleenex. Your sutures are under the skin and will not show - they will dissolve on their own.  You may resume light daily activities beginning the day after discharge (such as self-care,  walking, climbing stairs), gradually increasing activities as tolerated. You may have sexual intercourse when it is comfortable. Refrain from any heavy lifting or straining until approved by your doctor. You may drive when you no longer are taking prescription pain medication, you can comfortably wear a seatbelt, and you can safely maneuver your car and apply the brakes.  You will see your doctor in the office for a follow-up appointment approximately three weeks after your surgery.  Make sure that you call for this appointment within a day or two after you arrive home to insure a convenient appointment time. Please have any requested laboratory tests performed a few days prior to your office visit so that the results will be available at your follow up appointment.  WHEN TO CALL THE [facility] OFFICE: -- Fever greater than 101.5 -- Inability to urinate -- Nausea and/or vomiting - persistent -- Extreme swelling or bruising -- Continued bleeding from incision -- Increased pain, redness, or drainage from the incision -- Difficulty swallowing or breathing -- Muscle cramping or spasms -- Numbness or tingling in hands or around lips  The clinic staff is available to answer your questions during regular business hours.  Please don't hesitate to call and ask to speak to one of the nurses if you have concerns.  [facility] OFFICE: [phone] (24 hours)  Please sign up for MyChart accounts. This will allow you to communicate directly with my nurse or myself without having to call the office. It will also allow you   to view your test results. You will need to enroll in MyChart for my office ([facility]) and for the hospital ([facility]).  Todd Gerkin, MD [facility] A [facility] practice 

## 2022-12-03 NOTE — Anesthesia Procedure Notes (Signed)
Procedure Name: Intubation Date/Time: 12/03/2022 10:34 AM  Performed by: Garth Bigness, CRNAPre-anesthesia Checklist: Patient identified, Emergency Drugs available, Suction available and Patient being monitored Patient Re-evaluated:Patient Re-evaluated prior to induction Oxygen Delivery Method: Circle system utilized Preoxygenation: Pre-oxygenation with 100% oxygen Induction Type: IV induction Ventilation: Mask ventilation without difficulty Laryngoscope Size: Mac and 3 Grade View: Grade I Tube type: Oral Tube size: 7.0 mm Number of attempts: 1 Airway Equipment and Method: Stylet Placement Confirmation: ETT inserted through vocal cords under direct vision, positive ETCO2 and breath sounds checked- equal and bilateral Secured at: 22 cm Tube secured with: Tape Dental Injury: Teeth and Oropharynx as per pre-operative assessment

## 2022-12-04 ENCOUNTER — Encounter (HOSPITAL_COMMUNITY): Payer: Self-pay | Admitting: Surgery

## 2022-12-04 LAB — SURGICAL PATHOLOGY

## 2022-12-10 ENCOUNTER — Other Ambulatory Visit: Payer: Self-pay | Admitting: Family Medicine

## 2022-12-10 ENCOUNTER — Other Ambulatory Visit: Payer: Self-pay

## 2022-12-10 ENCOUNTER — Other Ambulatory Visit (HOSPITAL_COMMUNITY): Payer: Self-pay

## 2022-12-10 DIAGNOSIS — I10 Essential (primary) hypertension: Secondary | ICD-10-CM

## 2022-12-10 MED ORDER — BENAZEPRIL HCL 20 MG PO TABS
20.0000 mg | ORAL_TABLET | Freq: Every day | ORAL | 0 refills | Status: DC
Start: 2022-12-10 — End: 2023-01-18
  Filled 2022-12-10 – 2023-01-09 (×3): qty 90, 90d supply, fill #0

## 2022-12-11 ENCOUNTER — Other Ambulatory Visit (HOSPITAL_COMMUNITY): Payer: Self-pay

## 2022-12-14 ENCOUNTER — Ambulatory Visit: Payer: PRIVATE HEALTH INSURANCE | Primary: Family

## 2022-12-21 ENCOUNTER — Other Ambulatory Visit: Payer: Self-pay

## 2022-12-21 ENCOUNTER — Other Ambulatory Visit (HOSPITAL_COMMUNITY): Payer: Self-pay

## 2022-12-21 ENCOUNTER — Ambulatory Visit: Payer: PRIVATE HEALTH INSURANCE | Primary: Family

## 2022-12-21 DIAGNOSIS — Z9089 Acquired absence of other organs: Secondary | ICD-10-CM | POA: Diagnosis not present

## 2022-12-21 DIAGNOSIS — Z9889 Other specified postprocedural states: Secondary | ICD-10-CM | POA: Diagnosis not present

## 2022-12-21 DIAGNOSIS — E210 Primary hyperparathyroidism: Secondary | ICD-10-CM | POA: Diagnosis not present

## 2023-01-01 NOTE — Discharge Instructions (Signed)
OUTPATIENT MEDICATIONS    SCHEDULED:    -For Pain:    Tylenol (Acetaminophen) 500 mg: Take 2 tabs by mouth every 8 hours for pain.     Mobic (Meloxicam) 15 mg: Take 1 cap by mouth once daily with food in the evening.     -For Stomach Acid control:    Pantoprazole (Protonix) 20 mg: Take 1 tablet daily while taking aspirin to prevent stomach ulcers.     -To Prevent Constipation:    Colace (Docusate sodium) 100 mg: Take 1 cap by mouth twice daily while taking narcotics to avoid constipation.    Miralax (Polyethylene glycol): Mix 17 Grams with 8 oz. juice or water and take by mouth up to twice daily as needed     -To Prevent Blood Clots    Aspirin EC 81 mg: Take 1 tablet by mouth twice daily for 6 weeks       ONLY AS NEEDED:    Ultram (Tramadol) 50 mg: Take 1 tab by mouth every 6 hours for pain not controlled by scheduled pain medication    Oxycodone (Roxicodone) 5 mg: Take 1 tab by mouth every 4 hours as needed for pain not controlled by scheduled pain medication and Ultram (Tramadol)       DISCHARGE ACTIVITY  ?   Walking is the most important therapy after a hip replacement. Gradually increase your walking daily. Start by walking daily 5-10 steps every hour while awake. The goal is to be walking 20-30 minutes 1-2 times per day by 6 weeks post-op. You may progress to a single crutch or cane as tolerated.  ?  Weight-bearing as tolerated with walker or crutches for a minimum of 2 weeks to prevent falls. (Discuss with your surgeon at 2 week post-op appointment) Slowly transition to a cane and then nothing when you feel comfortable and safe (everyone is different). This can take anywhere from a few weeks to a few months.  ?  No running or high impact activities.  ?  You may put full weight on your hip unless instructed otherwise.  ?  HIP REPLACEMENT PRECAUTIONS    Avoid extremes of motion.  No pivoting on operative leg for 6 weeks.    SLEEP    You may sleep on your back, stomach or either side with a pillow between your  knees.  You may resume sexual relations according to your comfort and must avoid extremes of motion.    BLOOD CLOT PREVENTION    You will be on a medication to prevent blood clots for 4-6 weeks after surgery. Take as directed until all tablets are taken.  ?   Walking regularly every day helps decrease the risk of blood clots!    COMPRESSION STOCKINGS     Wear knee-high compression stockings during the day as needed until swelling resolves. Remove stockings at bedtime.    You may use an ace wrap instead of stockings to help with swelling. Apply the ace wrap from middle of calf to middle of thigh.    WOUND DRESSING INSTRUCTIONS    Please do not remove the bandage. Keep in place until follow-up appointment 10-14 days after surgery.    Call my scheduler, Christy, with any new drainage 5 days after surgery or any other wound questions: [phone]    SHOWERING  ?   It is OK to shower ONLY if incision remains dry.  ?   You may shower with the outer dressing in place, immediately following surgery. Make sure   the edges are sealed to avoid getting the wound wet. If becomes wet under bandage, remove and call the [facility] office.    After bandage is removed (at 2 week postoperative visit), you may shower.  DO NOT scrub your incision, but allow soapy water to flow over it and pat dry.   ?   Do not wash or scrub the incision for 6 weeks.  ?   Do not apply topical antibiotics, creams, lotions, ointments, etc. to the incision for 6 weeks post-op.    Do not submerge in water (pool/tub/bath/ocean/lake/etc.) until 4-6 weeks after surgery or until the incision has healed with no scabs remaining.    ICE  ?   Inflammation in the operative leg is normal for several months after surgery  ?   Use ice several times a day for 30 minutes at a time if ice pack or continuously if using the Ice circulating machine.     Do not apply heat to the wound.    PROCEDURES    Any elective dental cleaning/procedure or invasive procedures like a colonoscopy  should not be performed within 3 months following a joint replacement.  ?   You must take antibiotic prophylaxis before any dental cleaning or other invasive procedure.    DRIVING  ?   Necessary travel only for 6 weeks.  ?   You should not drive until you are able to walk WITHOUT a walker (a cane is OK) and are not taking any narcotic pain medications. This is usually around 3-4 weeks.  ?   Most patients do not require a handicapped parking pass. If necessary, we will provide one for a maximum duration of 3 months.    QUESTIONS/CONCERNS    Call the [facility] front desk at [phone] and request to speak to the Orthopedic Surgery on-call physician.     Medication Requests: Call the [facility] main number at [phone], or the [facility] front desk at [phone] and request to speak to the Orthopedic Surgery on-call phsyician.     POSTOPERATIVE APPOINTMENT    We will monitor your progress with follow-up clinic visits at approximately 2 and 6 weeks following your surgery.  ?   Follow-up at [facility] as scheduled previously. Phone: [phone]

## 2023-01-04 ENCOUNTER — Encounter

## 2023-01-04 MED ORDER — ONDANSETRON 8 MG PO TBDP
8 | ORAL_TABLET | Freq: Three times a day (TID) | ORAL | 0 refills | Status: AC | PRN
Start: 2023-01-04 — End: ?

## 2023-01-04 MED ORDER — ASPIRIN 81 MG PO CHEW
81 MG | ORAL_TABLET | Freq: Two times a day (BID) | ORAL | 0 refills | Status: AC
Start: 2023-01-04 — End: 2023-02-03

## 2023-01-04 MED ORDER — OXYCODONE HCL 5 MG PO TABS
5 MG | ORAL_TABLET | Freq: Four times a day (QID) | ORAL | 0 refills | Status: AC | PRN
Start: 2023-01-04 — End: 2023-01-11

## 2023-01-04 MED ORDER — DOCUSATE SODIUM 100 MG PO CAPS
100 | ORAL_CAPSULE | Freq: Two times a day (BID) | ORAL | 0 refills | Status: AC
Start: 2023-01-04 — End: 2023-02-03

## 2023-01-04 MED ORDER — TRAMADOL HCL 50 MG PO TABS
50 | ORAL_TABLET | Freq: Four times a day (QID) | ORAL | 0 refills | Status: AC | PRN
Start: 2023-01-04 — End: 2023-01-11

## 2023-01-04 MED ORDER — MELOXICAM 15 MG PO TABS
15 MG | ORAL_TABLET | Freq: Every day | ORAL | 1 refills | Status: AC
Start: 2023-01-04 — End: 2023-03-05

## 2023-01-04 MED ORDER — PANTOPRAZOLE SODIUM 40 MG PO TBEC
40 | ORAL_TABLET | Freq: Every day | ORAL | 0 refills | Status: AC
Start: 2023-01-04 — End: 2023-02-03

## 2023-01-04 MED ORDER — ACETAMINOPHEN 500 MG PO TABS
500 MG | ORAL_TABLET | Freq: Three times a day (TID) | ORAL | 0 refills | Status: AC
Start: 2023-01-04 — End: 2023-02-03

## 2023-01-04 NOTE — Progress Notes (Signed)
Post op medications sent to the patient's pharmacy ahead of their surgery on Thursday

## 2023-01-04 NOTE — Other (Signed)
Instructions for your surgery at [facility]      Today's Date:  01/04/2023      Patient's Name:  Krista Fletcher           Surgery Date:  01/07/2023              Please enter the main entrance of the hospital and check-in at the front security desk located in the lobby. They will direct you to the area to report for your surgery.     Do NOT eat or drink anything, including candy, gum, or ice chips after midnight prior to your surgery, unless you have specific instructions from your surgeon or anesthesia provider to do so.  Brush your teeth before coming to the hospital. You may swish with water, but do not swallow.  No smoking/Vaping/E-Cigarettes 24 hours prior to the day of surgery.  No alcohol 24 hours prior to the day of surgery.  No recreational drugs for one week prior to the day of surgery.  Bring Photo ID, Insurance information, and Co-pay if required on day of surgery.  Bring in pertinent legal documents, such as, Medical Power of [place], DNR, Advance Directive, etc.  Leave all valuables, including money/purse, at home.  Remove all jewelry, including ALL body piercings, nail polish, acrylic nails, and makeup (including mascara); no lotions, powders, deodorant, or perfume/cologne/after shave on the skin.  Follow instruction for Hibiclens washes and CHG wipes from surgeon's office.   Glasses and dentures may be worn to the hospital. They must be removed prior to surgery. Please bring case/container for glasses or dentures.   Contact lenses should not be worn on day of surgery.   Call your doctor's office if symptoms of a cold or illness develop within 24-48 hours prior to your surgery.  Call your doctor's office if you have any questions concerning insurance or co-pays.  15. AN ADULT (relative or friend 18 years or older) MUST DRIVE YOU HOME AFTER YOUR SURGERY.  16. Please make arrangements for a responsible adult (18 years or older) to be with you for 24 hours after your surgery.    17. TWO VISITORS will be allowed in the waiting area during your surgery.  Exceptions may be made for surgical admissions, per nursing unit guidelines      Special Instructions:      Bring a list of CURRENT medications.  Follow instructions from the office regarding Blood Thinners . Last dose Aspirin 07/17  Follow instructions from the office regarding medications to take the morning of surgery. Amlodipine .  Bring CPAP machine.      On day of surgery if you are running late, unable to make procedure time, or sick, please call the Pre-op department at [phone]    These surgical instructions were reviewed with [name] during the PAT phone call.

## 2023-01-06 NOTE — Telephone Encounter (Signed)
Call placed to patient, ID verified x 2. Patient  has decided with their surgeon to have a total hip  replacement to decrease  pain and improve mobility . Topics discussed included surgery preparation, what to expect the day of surgery, medications, physical and occupational therapy, and discharge planning.  It was discussed that this is considered an elective surgery and that prior to the surgery  decisions such as arranging for help at home once they are discharged needs to be made.Patient agreed to get home ready for surgery and to have a ride arranged to go home. She identifies her daughter and sister in law as her support system, a walker will be provided to her by the coordinator and she would like to discharge home day of surgery. She lives in a one story home with no stairs to enter. She has already obtained her postoperative medications.  Instructions were given for CHG bathing. Patient states that she will shower with dial soap.  Patient will complete the procedure the morning of surgery.  Patient was reminded not to apply any deoderant, perfumes, makeup or lotions to skin the morning of surgery. She will remain NPO after midnight and  will take only the medications as instructed to take by her surgeon the morning of surgery with a sip of water.Patient verbalized that she was instructed to take her amlodipine the morning of surgery with a sip of water. A total hip replacement  education book will be provided to patient post op prior to discharge. Education regarding the importance of early and frequent ambulation to avoid surgical complications and to assist with pain was provided. Recommended the use of ice to assist with pain and swelling post op for 20 minutes an hour, not to be placed directly on her  skin. Patient verbalized understanding of all information provided.  Opportunity was given to ask questions and phone number of the Orthopaedic Program Coordinator  was given for any questions or concerns  that may arise later.

## 2023-01-07 ENCOUNTER — Non-Acute Institutional Stay: Admit: 2023-01-07 | Payer: PRIVATE HEALTH INSURANCE | Primary: Family

## 2023-01-07 ENCOUNTER — Inpatient Hospital Stay: Payer: PRIVATE HEALTH INSURANCE | Attending: Orthopaedic Surgery

## 2023-01-07 MED ORDER — ONDANSETRON HCL 4 MG/2ML IJ SOLN
4 MG/2ML | INTRAMUSCULAR | Status: DC | PRN
  Administered 2023-01-07: 16:00:00 4 via INTRAVENOUS

## 2023-01-07 MED ORDER — POVIDONE-IODINE 5 % OP SOLN
5 | OPHTHALMIC | Status: AC
Start: 2023-01-07 — End: ?

## 2023-01-07 MED ORDER — PROCHLORPERAZINE EDISYLATE 10 MG/2ML IJ SOLN
10 | Freq: Once | INTRAMUSCULAR | Status: DC | PRN
Start: 2023-01-07 — End: 2023-01-07

## 2023-01-07 MED ORDER — NORMAL SALINE FLUSH 0.9 % IV SOLN
0.9 | Freq: Two times a day (BID) | INTRAVENOUS | Status: DC
Start: 2023-01-07 — End: 2023-01-07

## 2023-01-07 MED ORDER — STERILE WATER FOR INJECTION (MIXTURES ONLY)
1 g | Freq: Three times a day (TID) | INTRAMUSCULAR | Status: DC
Start: 2023-01-07 — End: 2023-01-07
  Administered 2023-01-07: 21:00:00 2000 mg via INTRAVENOUS

## 2023-01-07 MED ORDER — FENTANYL CITRATE (PF) 100 MCG/2ML IJ SOLN
100 MCG/2ML | INTRAMUSCULAR | Status: DC | PRN
  Administered 2023-01-07: 15:00:00 50 via INTRAVENOUS
  Administered 2023-01-07 (×2): 25 via INTRAVENOUS
  Administered 2023-01-07 (×2): 50 via INTRAVENOUS

## 2023-01-07 MED ORDER — TRAMADOL HCL 50 MG PO TABS
50 | Freq: Four times a day (QID) | ORAL | Status: DC
Start: 2023-01-07 — End: 2023-01-07
  Administered 2023-01-07: 18:00:00 50 mg via ORAL

## 2023-01-07 MED ORDER — VASOPRESSIN 20 UNIT/ML IV SOLN
20 UNIT/ML | INTRAVENOUS | Status: DC | PRN
  Administered 2023-01-07: 15:00:00 2 via INTRAVENOUS

## 2023-01-07 MED ORDER — SODIUM CHLORIDE 0.9 % IV SOLN
0.9 | INTRAVENOUS | Status: AC
Start: 2023-01-07 — End: ?

## 2023-01-07 MED ORDER — ACETAMINOPHEN 500 MG PO TABS
500 | Freq: Once | ORAL | Status: AC
Start: 2023-01-07 — End: 2023-01-07
  Administered 2023-01-07: 12:00:00 1000 mg via ORAL

## 2023-01-07 MED ORDER — NORMAL SALINE FLUSH 0.9 % IV SOLN
0.9 | INTRAVENOUS | Status: DC | PRN
Start: 2023-01-07 — End: 2023-01-07

## 2023-01-07 MED ORDER — SODIUM CHLORIDE 0.9 % IV SOLN (MINI-BAG)
0.9 | INTRAVENOUS | Status: AC
Start: 2023-01-07 — End: 2023-01-07
  Administered 2023-01-07: 14:00:00 1000 mg via INTRAVENOUS

## 2023-01-07 MED ORDER — FLEET ENEMA 7-19 GM/118ML RE ENEM
7-19 | Freq: Every day | RECTAL | Status: DC | PRN
Start: 2023-01-07 — End: 2023-01-07

## 2023-01-07 MED ORDER — GLYCOPYRROLATE 0.4 MG/2ML IJ SOLN
0.4 MG/2ML | INTRAMUSCULAR | Status: DC | PRN
  Administered 2023-01-07: 13:00:00 .2 via INTRAVENOUS

## 2023-01-07 MED ORDER — MEPIVACAINE HCL (PF) 2 % IJ SOLN
2 | INTRAMUSCULAR | Status: AC
Start: 2023-01-07 — End: ?

## 2023-01-07 MED ORDER — DIPHENHYDRAMINE HCL 50 MG/ML IJ SOLN
50 | Freq: Four times a day (QID) | INTRAMUSCULAR | Status: DC | PRN
Start: 2023-01-07 — End: 2023-01-07

## 2023-01-07 MED ORDER — EPHEDRINE SULFATE (PRESSORS) 25 MG/5ML IV SOSY
25 | INTRAVENOUS | Status: AC
Start: 2023-01-07 — End: ?

## 2023-01-07 MED ORDER — ASPIRIN 81 MG PO TBEC
81 | Freq: Two times a day (BID) | ORAL | Status: DC
Start: 2023-01-07 — End: 2023-01-07

## 2023-01-07 MED ORDER — ONDANSETRON HCL 4 MG/2ML IJ SOLN
4 | INTRAMUSCULAR | Status: AC
Start: 2023-01-07 — End: ?

## 2023-01-07 MED ORDER — APREPITANT 40 MG PO CAPS
40 | Freq: Once | ORAL | Status: AC
Start: 2023-01-07 — End: 2023-01-07
  Administered 2023-01-07: 12:00:00 40 mg via ORAL

## 2023-01-07 MED ORDER — FAMOTIDINE 20 MG PO TABS
20 | Freq: Two times a day (BID) | ORAL | Status: DC
Start: 2023-01-07 — End: 2023-01-07

## 2023-01-07 MED ORDER — PROPOFOL 200 MG/20ML IV EMUL
200 | INTRAVENOUS | Status: AC
Start: 2023-01-07 — End: ?

## 2023-01-07 MED ORDER — DIPHENHYDRAMINE HCL 25 MG PO CAPS
25 | Freq: Four times a day (QID) | ORAL | Status: DC | PRN
Start: 2023-01-07 — End: 2023-01-07

## 2023-01-07 MED ORDER — PHENYLEPHRINE HCL 1 MG/10ML IV SOSY
1 | INTRAVENOUS | Status: AC
Start: 2023-01-07 — End: ?

## 2023-01-07 MED ORDER — LIDOCAINE HCL (PF) 2 % IJ SOLN
2 | INTRAMUSCULAR | Status: AC
Start: 2023-01-07 — End: ?

## 2023-01-07 MED ORDER — SODIUM CHLORIDE 0.9 % IV SOLN
0.9 | INTRAVENOUS | Status: DC | PRN
Start: 2023-01-07 — End: 2023-01-07

## 2023-01-07 MED ORDER — FENTANYL CITRATE (PF) 100 MCG/2ML IJ SOLN
100 | INTRAMUSCULAR | Status: AC
Start: 2023-01-07 — End: ?

## 2023-01-07 MED ORDER — LIDOCAINE HCL (PF) 2 % IJ SOLN
2 % | INTRAMUSCULAR | Status: DC | PRN
  Administered 2023-01-07: 14:00:00 100 via INTRAVENOUS

## 2023-01-07 MED ORDER — SODIUM CHLORIDE (PF) 0.9 % IJ SOLN
0.9 | INTRAMUSCULAR | Status: DC | PRN
Start: 2023-01-07 — End: 2023-01-07

## 2023-01-07 MED ORDER — HYDROMORPHONE 0.5MG/0.5ML IJ SOLN
1 | Status: DC | PRN
Start: 2023-01-07 — End: 2023-01-07

## 2023-01-07 MED ORDER — FENTANYL CITRATE (PF) 100 MCG/2ML IJ SOLN
100 | INTRAMUSCULAR | Status: DC | PRN
Start: 2023-01-07 — End: 2023-01-07

## 2023-01-07 MED ORDER — IPRATROPIUM-ALBUTEROL 0.5-2.5 (3) MG/3ML IN SOLN
Freq: Once | RESPIRATORY_TRACT | Status: DC | PRN
Start: 2023-01-07 — End: 2023-01-07

## 2023-01-07 MED ORDER — SODIUM CHLORIDE 0.9 % IR SOLN
0.9 | Status: DC | PRN
Start: 2023-01-07 — End: 2023-01-07
  Administered 2023-01-07: 14:00:00

## 2023-01-07 MED ORDER — MELOXICAM 7.5 MG PO TABS
7.5 | Freq: Once | ORAL | Status: AC
Start: 2023-01-07 — End: 2023-01-07
  Administered 2023-01-07: 12:00:00 7.5 mg via ORAL

## 2023-01-07 MED ORDER — VANCOMYCIN HCL 1 G IV SOLR
1 | INTRAVENOUS | Status: AC
Start: 2023-01-07 — End: ?

## 2023-01-07 MED ORDER — STERILE WATER FOR INJECTION (MIXTURES ONLY)
1 | INTRAMUSCULAR | Status: AC
Start: 2023-01-07 — End: 2023-01-07
  Administered 2023-01-07: 14:00:00 2000 mg via INTRAVENOUS

## 2023-01-07 MED ORDER — VASOPRESSIN 20 UNIT/ML IV SOLN
20 | INTRAVENOUS | Status: AC
Start: 2023-01-07 — End: ?

## 2023-01-07 MED ORDER — EPINEPHRINE 1 MG/ML IJ SOLN (MIXTURES ONLY)
Freq: Once | INTRAMUSCULAR | Status: DC
Start: 2023-01-07 — End: 2023-01-07

## 2023-01-07 MED ORDER — FAMOTIDINE (PF) 20 MG/2ML IV SOLN
20 | Freq: Two times a day (BID) | INTRAVENOUS | Status: DC
Start: 2023-01-07 — End: 2023-01-07

## 2023-01-07 MED ORDER — TRANEXAMIC ACID 1000 MG/10ML IV SOLN
1000 MG/10ML | Freq: Once | INTRAVENOUS | Status: AC | PRN
Start: 2023-01-07 — End: 2023-01-07
  Administered 2023-01-07: 16:00:00 1000 mg via INTRAVENOUS

## 2023-01-07 MED ORDER — FENTANYL CITRATE (PF) 50 MCG/ML IJ SOLN
50 | INTRAMUSCULAR | Status: DC | PRN
Start: 2023-01-07 — End: 2023-01-07

## 2023-01-07 MED ORDER — ONDANSETRON 4 MG PO TBDP
4 | Freq: Three times a day (TID) | ORAL | Status: DC | PRN
Start: 2023-01-07 — End: 2023-01-07

## 2023-01-07 MED ORDER — POVIDONE-IODINE 5 % OP SOLN
5 | OPHTHALMIC | Status: DC | PRN
Start: 2023-01-07 — End: 2023-01-07
  Administered 2023-01-07: 16:00:00

## 2023-01-07 MED ORDER — BISACODYL 5 MG PO TBEC
5 | Freq: Every day | ORAL | Status: DC
Start: 2023-01-07 — End: 2023-01-07
  Administered 2023-01-07: 21:00:00 5 mg via ORAL

## 2023-01-07 MED ORDER — EPHEDRINE SULFATE (PRESSORS) 25 MG/5ML IV SOSY
25 MG/5ML | INTRAVENOUS | Status: DC | PRN
  Administered 2023-01-07 (×4): 5 via INTRAVENOUS

## 2023-01-07 MED ORDER — PHENYLEPHRINE HCL 1 MG/10ML IV SOSY
1 MG/10ML | INTRAVENOUS | Status: DC | PRN
  Administered 2023-01-07 (×2): 100 via INTRAVENOUS
  Administered 2023-01-07 (×9): 200 via INTRAVENOUS

## 2023-01-07 MED ORDER — VANCOMYCIN HCL 1 G IV SOLR
1 | INTRAVENOUS | Status: DC | PRN
Start: 2023-01-07 — End: 2023-01-07
  Administered 2023-01-07: 16:00:00 1000 via TOPICAL

## 2023-01-07 MED ORDER — ROPIVACAINE HCL 5 MG/ML IJ SOLN
INTRAMUSCULAR | Status: DC | PRN
Start: 2023-01-07 — End: 2023-01-07
  Administered 2023-01-07: 16:00:00 50 via INTRA_ARTICULAR

## 2023-01-07 MED ORDER — MIDAZOLAM HCL 2 MG/2ML IJ SOLN
2 | INTRAMUSCULAR | Status: AC
Start: 2023-01-07 — End: ?

## 2023-01-07 MED ORDER — ONDANSETRON HCL 4 MG/2ML IJ SOLN
4 | Freq: Once | INTRAMUSCULAR | Status: DC | PRN
Start: 2023-01-07 — End: 2023-01-07

## 2023-01-07 MED ORDER — POLYETHYLENE GLYCOL 3350 17 G PO PACK
17 | Freq: Every day | ORAL | Status: DC | PRN
Start: 2023-01-07 — End: 2023-01-07

## 2023-01-07 MED ORDER — LACTATED RINGERS IV SOLN
INTRAVENOUS | Status: DC
Start: 2023-01-07 — End: 2023-01-07

## 2023-01-07 MED ORDER — LACTATED RINGERS IV SOLN
INTRAVENOUS | Status: DC
Start: 2023-01-07 — End: 2023-01-07
  Administered 2023-01-07 (×2): via INTRAVENOUS

## 2023-01-07 MED ORDER — ACETAMINOPHEN 500 MG PO TABS
500 | Freq: Three times a day (TID) | ORAL | Status: DC
Start: 2023-01-07 — End: 2023-01-07
  Administered 2023-01-07: 21:00:00 1000 mg via ORAL

## 2023-01-07 MED ORDER — DEXAMETHASONE SOD PHOSPHATE PF 10 MG/ML IJ SOLN
10 | Freq: Once | INTRAMUSCULAR | Status: AC
Start: 2023-01-07 — End: 2023-01-07
  Administered 2023-01-07: 12:00:00 10 mg via INTRAVENOUS

## 2023-01-07 MED ORDER — PROPOFOL 200 MG/20ML IV EMUL
200 MG/20ML | INTRAVENOUS | Status: DC | PRN
  Administered 2023-01-07: 14:00:00 30 via INTRAVENOUS
  Administered 2023-01-07: 14:00:00 50 via INTRAVENOUS
  Administered 2023-01-07: 15:00:00 100 via INTRAVENOUS
  Administered 2023-01-07: 14:00:00 70 via INTRAVENOUS

## 2023-01-07 MED ORDER — KETOROLAC TROMETHAMINE 15 MG/ML IJ SOLN
15 | Freq: Four times a day (QID) | INTRAMUSCULAR | Status: DC
Start: 2023-01-07 — End: 2023-01-07
  Administered 2023-01-07: 18:00:00 15 mg via INTRAVENOUS

## 2023-01-07 MED ORDER — GLYCOPYRROLATE 0.4 MG/2ML IJ SOLN
0.4 | INTRAMUSCULAR | Status: AC
Start: 2023-01-07 — End: ?

## 2023-01-07 MED ORDER — OXYCODONE HCL 5 MG PO TABS
5 | ORAL | Status: DC | PRN
Start: 2023-01-07 — End: 2023-01-07

## 2023-01-07 MED ORDER — MEPIVACAINE HCL (PF) 2 % IJ SOLN
2 % | INTRAMUSCULAR | Status: DC | PRN
  Administered 2023-01-07: 14:00:00 50 via EPIDURAL

## 2023-01-07 MED ORDER — PHENYLEPHRINE HCL 10 MG/ML SOLN (MIXTURES ONLY)
10 MG/ML | INTRAVENOUS | Status: DC | PRN
Start: 2023-01-07 — End: 2023-01-07
  Administered 2023-01-07: 15:00:00 10 via INTRAVENOUS

## 2023-01-07 MED ORDER — ONDANSETRON HCL 4 MG/2ML IJ SOLN
4 | Freq: Four times a day (QID) | INTRAMUSCULAR | Status: DC | PRN
Start: 2023-01-07 — End: 2023-01-07

## 2023-01-07 MED ORDER — FAMOTIDINE 20 MG PO TABS
20 | Freq: Once | ORAL | Status: DC
Start: 2023-01-07 — End: 2023-01-07

## 2023-01-07 MED ORDER — MEPERIDINE HCL 25 MG/ML IJ SOLN
25 | INTRAMUSCULAR | Status: DC | PRN
Start: 2023-01-07 — End: 2023-01-07

## 2023-01-07 MED ORDER — MIDAZOLAM HCL 2 MG/2ML IJ SOLN
2 MG/2ML | INTRAMUSCULAR | Status: DC | PRN
  Administered 2023-01-07: 13:00:00 2 via INTRAVENOUS

## 2023-01-07 MED ORDER — PROPOFOL 1000 MG/100ML IV EMUL
1000 | INTRAVENOUS | Status: AC
Start: 2023-01-07 — End: ?

## 2023-01-07 MED FILL — POLOCAINE-MPF 2 % IJ SOLN: 2 % | INTRAMUSCULAR | Qty: 20

## 2023-01-07 MED FILL — VASOSTRICT 20 UNIT/ML IV SOLN: 20 UNIT/ML | INTRAVENOUS | Qty: 1

## 2023-01-07 MED FILL — BISACODYL EC 5 MG PO TBEC: 5 MG | ORAL | Qty: 1

## 2023-01-07 MED FILL — SODIUM CHLORIDE 0.9 % IV SOLN: 0.9 % | INTRAVENOUS | Qty: 250

## 2023-01-07 MED FILL — ACETAMINOPHEN EXTRA STRENGTH 500 MG PO TABS: 500 MG | ORAL | Qty: 2

## 2023-01-07 MED FILL — PHENYLEPHRINE HCL 1 MG/10ML IV SOSY: 1 MG/0ML | INTRAVENOUS | Qty: 10

## 2023-01-07 MED FILL — CEFAZOLIN SODIUM 1 G IJ SOLR: 1 g | INTRAMUSCULAR | Qty: 2000

## 2023-01-07 MED FILL — TRANEXAMIC ACID 1000 MG/10ML IV SOLN: 1000 MG/10ML | INTRAVENOUS | Qty: 10

## 2023-01-07 MED FILL — XYLOCAINE-MPF 2 % IJ SOLN: 2 % | INTRAMUSCULAR | Qty: 5

## 2023-01-07 MED FILL — FENTANYL CITRATE (PF) 100 MCG/2ML IJ SOLN: 100 MCG/2ML | INTRAMUSCULAR | Qty: 2

## 2023-01-07 MED FILL — BD POSIFLUSH 0.9 % IV SOLN: 0.9 % | INTRAVENOUS | Qty: 40

## 2023-01-07 MED FILL — FLEET ENEMA 7-19 GM/118ML RE ENEM: 7-19 GM/118ML | RECTAL | Qty: 1

## 2023-01-07 MED FILL — VANCOMYCIN HCL 1 G IV SOLR: 1 g | INTRAVENOUS | Qty: 1000

## 2023-01-07 MED FILL — FAMOTIDINE 20 MG PO TABS: 20 MG | ORAL | Qty: 1

## 2023-01-07 MED FILL — DEXAMETHASONE SOD PHOSPHATE PF 10 MG/ML IJ SOLN: 10 MG/ML | INTRAMUSCULAR | Qty: 1

## 2023-01-07 MED FILL — VASOPRESSIN 20 UNIT/ML IV SOLN: 20 UNIT/ML | INTRAVENOUS | Qty: 1

## 2023-01-07 MED FILL — AKOVAZ 25 MG/5ML IV SOSY: 25 MG/5ML | INTRAVENOUS | Qty: 5

## 2023-01-07 MED FILL — LACTATED RINGERS IV SOLN: INTRAVENOUS | Qty: 1000

## 2023-01-07 MED FILL — ONDANSETRON HCL 4 MG/2ML IJ SOLN: 4 MG/2ML | INTRAMUSCULAR | Qty: 2

## 2023-01-07 MED FILL — BETADINE OPHTHALMIC PREP 5 % OP SOLN: 5 % | OPHTHALMIC | Qty: 30

## 2023-01-07 MED FILL — MIDAZOLAM HCL 2 MG/2ML IJ SOLN: 2 MG/ML | INTRAMUSCULAR | Qty: 2

## 2023-01-07 MED FILL — EPINEPHRINE 1 MG/ML IJ SOLN: 1 mg/mL | INTRAMUSCULAR | Qty: 0.6

## 2023-01-07 MED FILL — DIPRIVAN 200 MG/20ML IV EMUL: 200 MG/20ML | INTRAVENOUS | Qty: 20

## 2023-01-07 MED FILL — TRAMADOL HCL 50 MG PO TABS: 50 MG | ORAL | Qty: 1

## 2023-01-07 MED FILL — PROPOFOL 1000 MG/100ML IV EMUL: 1000 MG/100ML | INTRAVENOUS | Qty: 100

## 2023-01-07 MED FILL — KETOROLAC TROMETHAMINE 15 MG/ML IJ SOLN: 15 MG/ML | INTRAMUSCULAR | Qty: 1

## 2023-01-07 MED FILL — APREPITANT 40 MG PO CAPS: 40 MG | ORAL | Qty: 1

## 2023-01-07 MED FILL — MELOXICAM 7.5 MG PO TABS: 7.5 MG | ORAL | Qty: 1

## 2023-01-07 MED FILL — GLYCOPYRROLATE 0.4 MG/2ML IJ SOLN: 0.4 MG/2ML | INTRAMUSCULAR | Qty: 2

## 2023-01-07 NOTE — Other (Signed)
Patient /Family /Designee has been informed that [facility] is not responsible for patient belongings per policy and the signed [facility] Patient Agreement document.  Personal items should be sent home or checked in with security.  Patient /Family /Designee selected the following action:                            [x]  Send personal items home with a family member or friend                                                 []  Check in personal items with security, excluding clothing                            []  Maintain personal items at the bedside, against recommendation                                 by [facility]                                   ** If patient /family /designee chooses to maintain personal items at the bedside,                                      Complete the patient belongings inventory in the EMR.

## 2023-01-07 NOTE — Other (Signed)
Rounded on patient s/p left total hip replacement with Dr. Lorin Picket, [facility] 01/07/2023. Patient observed to be alert and oriented x 3, sitting up in bedside chair. She denies chest pain, shortness of breath,nausea or vomiting. She denies any numbness in her lower extremities, she was able to ambulate to bedside chair without difficulty with of rolling walker . She states that her pain is well controlled at present. Picco dressing observed to left posterior lateral hip, batteries functioning, extra dressing provided to patient. Ice pack In place for comfort. Patient was provided with lunch and large cup of water. She was then provided with total hip replacement education book, medication education sheet, medication schedule and ted hose. Patient was reminded that ted hose are for daytime wear only to assist with swelling and should be removed at night. Reviewed the use of incentive spirometry. Patient was instructed to use ten times hourly while in hospital and to continue use at home for the next few days to keep lungs expanded and free from complications. Reviewed postoperative showering instructions. Patient was reminded that she may shower in one week when the batteries die on her dressing. She was instructed to cut the tubes and to leave the dressing in place.  no tubs or submersion in water. She is to contact clinic with any dressing issues. Encouraged hourly ambulation , short distances every hour to assist with stiffness, short distances every hour just 5 minutes  or so. Encouraged icing 20 minutes every hour, not to be placed directly on on her skin to assist with swelling. Patient verbalized understanding of all information provided. She would like to discharge home today.  Patient was provided with a rolling walker and has other dme that is required at home. She has a  support system in place. She does not require home health or home physical therapy. She has already obtained her postoperative medications.  Will follow patient until discharge and then postoperatively.

## 2023-01-07 NOTE — Progress Notes (Signed)
1800-  Patient discharged IV removed pressure dressing applied. Instructions  reviewed with patient.  All questions and  concerns were answered accordingly. Patient transported to main  entrance via  wheelchair.

## 2023-01-07 NOTE — Progress Notes (Signed)
Physical Therapy  PHYSICAL THERAPY EVALUATION/DISCHARGE    Patient: Krista Fletcher (67 y.o. 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 Mod I QC has very supportive family near by.     ASSESSMENT AND RECOMMENDATIONS:  Patient is 67 yo female admitted to hospital for L THA and presents today POD 0 and agreeable to therapy. Patient was educated on weight bearing status, hip precautions, and role of therapy. Patient was given demo with instruction on sit <> stand transfer, gait training, and stair negotiation. Patient transferred to standing with SBA and ambulated 250 feet with SBA with RW. She completes 4 steps with CGA with appropriate step-to pattern. Patient demonstrated fair compliance with precautions throughout session, requires cueing from PT and also from family on regards to IR when turning around. Pt requested to use restroom towards end of session, supervision provided, nurse notified. At conclusion of session patient transferred to sitting in recline and was left resting with call bell by the side. Patient instructed to call for assistance if they needed to get up for any reason and denied need for further assistance. Patient demonstrates decreased strength, mobility, and endurance and will benefit from home health PT upon d/c. Pt is cleared from an acute care PT standpoint.       Patient does not require further skilled physical therapy intervention at this level of care.    Further Equipment Recommendations for Discharge:  owns appropriate DME    AMPAC: AM-PAC  Inpatient Mobility Raw Score : 20      Current research shows that an AM-PAC score of 18 (14 without stairs) or greater is associated with a discharge to the patient's home setting.    This AMPAC score should be considered in conjunction with interdisciplinary team recommendations to determine the most appropriate discharge setting. Patient's social support, diagnosis, medical stability, and prior level of function should also be taken into consideration.     SUBJECTIVE:   Patient stated "the pain is better once I'm walking."    OBJECTIVE DATA SUMMARY:     Past Medical History:   Diagnosis Date    Hypertension     OSA on CPAP 2009     Past Surgical History:   Procedure Laterality Date    OVARIAN CYST SURGERY Left 2005       Home Situation:  Social/Functional History  Lives With: Alone  Type of Home: House  Home Layout: One level  Home Access: Stairs to enter without rails  Entrance Stairs - Number of Steps: 2  Home Equipment: Cane - Quad, Environmental consultant - Rolling  Has the patient had two or more falls in the past year or any fall with injury in the past year?: No  Ambulation Assistance: Independent (Mod I QC)  Transfer Assistance: Independent  Critical Behavior:  Orientation  Overall Orientation Status: Within Normal Limits  Orientation Level: Oriented X4  Cognition  Overall Cognitive Status: WNL    Strength:    Strength: Generally decreased, functional    Tone & Sensation:   Tone: Normal  Sensation: Intact  Range Of Motion:  AROM: Generally decreased, functional  PROM: Generally decreased, functional    Functional Mobility:  Bed Mobility:     Bed Mobility Training  Bed Mobility Training: Yes  Transfers:     Transfer Training  Transfer Training: Yes  Sit to Stand: Stand-by assistance  Stand to Sit: Stand-by assistance  Balance:               Balance  Sitting: Intact  Standing: Impaired;With support  Standing - Static: Good  Standing - Dynamic: Fair (+)          Ambulation/Gait Training:                       Gait  Gait  Training: Yes  Left Side Weight Bearing: As tolerated  Right Side Weight Bearing: Full  Overall Level of Assistance: Supervision  Distance (ft): 250 Feet  Assistive Device: Walker, rolling  Interventions: Safety awareness training  Base of Support: Widened  Speed/Cadence: Slow  Step Length: Right shortened;Left shortened  Stance: Left decreased  Gait Abnormalities: Antalgic;Decreased step clearance (step-to gait progressed to step-through)  Rail Use: Both  Stairs - Level of Assistance: Contact-guard assistance  Number of Stairs Trained: 4                  Pain:  Intensity Pre-treatment: 5/10   Intensity Post-treatment: 5/10  Scale: Numeric Rating Scale  Location: Left Hip  Quality: Aching and Sore  Intervention(s): Nurse notified, Repositioning , Ice, and Rest      Activity Tolerance:   Activity Tolerance: Patient tolerated evaluation without incident  Please refer to the flowsheet for vital signs taken during this treatment.    After treatment:   [x]          Patient left in no apparent distress sitting up in chair  []          Patient left in no apparent distress in bed  [x]          Call bell left within reach  [x]          Nursing notified  [x]          Family present  []          Bed alarm activated  []          SCDs applied    COMMUNICATION/EDUCATION:   Patient Education  Education Given To: Patient;Family  Education Provided: Role of Therapy;Plan of Care;Family Education;Home Exercise Program;Precautions;Transfer Training;Energy Conservation;Fall Prevention Strategies;Equipment  Education Method: Demonstration;Verbal;Teach Back  Barriers to Learning: None  Education Outcome: Verbalized understanding;Demonstrated understanding    Thank you for this referral.  Carollee Leitz, PT  Minutes: 32      Eval Complexity: Decision Making: Medium Complexity

## 2023-01-07 NOTE — Anesthesia Post-Procedure Evaluation (Signed)
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    Aldrete Phase I: Aldrete Score: 10    Aldrete Phase II:      Anesthesia Post Evaluation    Patient location during evaluation: PACU  Patient participation: complete - patient participated  Level of consciousness: awake  Airway patency: patent  Nausea & Vomiting: no nausea  Cardiovascular status: blood pressure returned to baseline  Respiratory status: acceptable  Hydration status: euvolemic  Pain management: adequate    No notable events documented.

## 2023-01-07 NOTE — Anesthesia Procedure Notes (Signed)
Spinal Block    End time: 01/07/2023 9:48 AM  Reason for block: primary anesthetic  Staffing  Performed: resident/CRNA and anesthesiologist   Anesthesiologist: Rivka Barbara, MD  Resident/CRNA: Resa Miner, APRN - CRNA  Performed by: Resa Miner, APRN - CRNA  Authorized by: Rivka Barbara, MD    Spinal Block  Patient position: sitting  Prep: Betadine  Patient monitoring: continuous pulse ox and frequent blood pressure checks  Approach: right paramedian  Location: L3/L4  Provider prep: mask and sterile gloves  Local infiltration: lidocaine  Needle  Needle type: Whitacre   Needle gauge: 25 G  Needle length: 5 in  Assessment  Sensory level: T8  Swirl obtained: Yes  CSF: clear  Attempts: 3+  Hemodynamics: stable  Preanesthetic Checklist  Completed: patient identified, IV checked, site marked, risks and benefits discussed, surgical/procedural consents, equipment checked, pre-op evaluation, timeout performed, anesthesia consent given, oxygen available, monitors applied/VS acknowledged, fire risk safety assessment completed and verbalized and blood product R/B/A discussed and consented

## 2023-01-07 NOTE — Anesthesia Pre-Procedure Evaluation (Signed)
Department of Anesthesiology  Preprocedure Note       Name:  Krista Fletcher   Age:  67 y.o.  DOB:  1956-01-16                                          MRN:  [id number]         Date:  01/07/2023      Surgeon: Moishe Spice):  Lorin Picket, Earle Gell, DO    Procedure: Procedure(s):  LEFT TOTAL HIP ARTHROPLASTY POSTEROLATERAL APPROACH; [[facility]]; ERAS *SEE COMMENTS*    Medications prior to admission:   Prior to Admission medications    Medication Sig Start Date End Date Taking? Authorizing Provider   aspirin 81 MG EC tablet Take 1 tablet by mouth daily   Yes [provider]   telmisartan (MICARDIS) 80 MG tablet Take 1 tablet by mouth daily    [provider]   hydroCHLOROthiazide (HYDRODIURIL) 25 MG tablet Take 1 tablet by mouth daily    [provider]   ondansetron (ZOFRAN-ODT) 8 MG TBDP disintegrating tablet Place 1 tablet under the tongue every 8 hours as needed for Nausea or Vomiting 01/04/23   Duplain, Bobette Mo, PA-C   acetaminophen (TYLENOL) 500 MG tablet Take 2 tablets by mouth in the morning and 2 tablets at noon and 2 tablets in the evening. 01/04/23 02/03/23  Duplain, Bobette Mo, PA-C   traMADol (ULTRAM) 50 MG tablet Take 1 tablet by mouth every 6 hours as needed for Pain for up to 7 days. Intended supply: 7 days. Take lowest dose possible to manage pain Max Daily Amount: 200 mg 01/04/23 01/11/23  Duplain, Bobette Mo, PA-C   pantoprazole (PROTONIX) 40 MG tablet Take 1 tablet by mouth daily 01/04/23 02/03/23  Duplain, Kaylah, PA-C   oxyCODONE (ROXICODONE) 5 MG immediate release tablet Take 1 tablet by mouth every 6 hours as needed for Pain for up to 7 days. Intended supply: 7 days. Take lowest dose possible to manage pain For breakthrough pain not controlled by tramadol. Not to be taken within 1 hour of tramadol. Max Daily Amount: 20 mg 01/04/23 01/11/23  Duplain, Bobette Mo, PA-C   meloxicam (MOBIC) 15 MG tablet Take 1 tablet by mouth daily Start with 1 pill the day before surgery and then daily  therafter 01/04/23 03/05/23  Duplain, Bobette Mo, PA-C   docusate sodium (COLACE) 100 MG capsule Take 1 capsule by mouth 2 times daily 01/04/23 02/03/23  Duplain, Bobette Mo, PA-C   aspirin (ASPIRIN 81) 81 MG chewable tablet Take 1 tablet by mouth 2 times daily 01/04/23 02/03/23  Duplain, Bobette Mo, PA-C   amLODIPine (NORVASC) 10 MG tablet Take 1 tablet by mouth daily 10/31/21   [provider]       Current medications:    Current Facility-Administered Medications   Medication Dose Route Frequency Provider Last Rate Last Admin    tranexamic acid (CYKLOKAPRON) 1,000 mg in sodium chloride 0.9 % 110 mL IVPB (mini-bag)  1,000 mg IntraVENous On Call to OR Duplain, Kaylah, PA-C        tranexamic acid (CYKLOKAPRON) 1,000 mg in sodium chloride 0.9 % 110 mL IVPB (mini-bag)  1,000 mg IntraVENous Once PRN Duplain, Kaylah, PA-C        sodium chloride flush 0.9 % injection 5-40 mL  5-40 mL IntraVENous 2 times per day Duplain, Kaylah, PA-C        sodium chloride flush 0.9 %  injection 5-40 mL  5-40 mL IntraVENous PRN Duplain, Kaylah, PA-C        0.9 % sodium chloride infusion   IntraVENous PRN Duplain, Kaylah, PA-C        ceFAZolin (ANCEF) 2,000 mg in sterile water 20 mL IV syringe  2,000 mg IntraVENous On Call to OR Duplain, Kaylah, PA-C        famotidine (PEPCID) tablet 20 mg  20 mg Oral Once Ivin Booty, APRN - CRNA        lactated ringers IV soln infusion   IntraVENous Continuous Ivin Booty, APRN - CRNA 75 mL/hr at 01/07/23 0805 NoRateChange at 01/07/23 0805    sodium chloride flush 0.9 % injection 5-40 mL  5-40 mL IntraVENous 2 times per day Ivin Booty, APRN - CRNA        sodium chloride flush 0.9 % injection 5-40 mL  5-40 mL IntraVENous PRN Ivin Booty, APRN - CRNA        0.9 % sodium chloride infusion   IntraVENous PRN Ivin Booty, APRN - CRNA           Allergies:    Allergies   Allergen Reactions    Sulfa Antibiotics        Problem List:    Patient Active Problem List   Diagnosis Code    Osteoarthritis  resulting from left hip dysplasia M16.32    Morbid obesity (HCC) E66.01    Hypertension I10    Status post total hip replacement, left Z61.096       Past Medical History:        Diagnosis Date    Hypertension     OSA on CPAP 2009       Past Surgical History:        Procedure Laterality Date    OVARIAN CYST SURGERY Left 2005       Social History:    Social History     Tobacco Use    Smoking status: Never     Passive exposure: Never    Smokeless tobacco: Never   Substance Use Topics    Alcohol use: Yes     Comment: Rare                                Counseling given: Not Answered      Vital Signs (Current):   Vitals:    01/04/23 1115 01/07/23 0715   BP:  121/69   Pulse:  76   Resp:  20   Temp:  98 F (36.7 C)   TempSrc:  Oral   SpO2:  98%   Weight: 107.5 kg (237 lb)    Height: 1.524 m (5')                                               BP Readings from Last 3 Encounters:   01/07/23 121/69   11/30/22 136/78       NPO Status: Time of last liquid consumption: 0345                        Time of last solid consumption: 0630  Date of last liquid consumption: 01/07/23                        Date of last solid food consumption: 01/06/23    BMI:   Wt Readings from Last 3 Encounters:   01/04/23 107.5 kg (237 lb)   11/30/22 109.3 kg (241 lb)   07/24/22 108.9 kg (240 lb)     Body mass index is 46.29 kg/m.    CBC: No results found for: "WBC", "RBC", "HGB", "HCT", "MCV", "RDW", "PLT"    CMP: No results found for: "NA", "K", "CL", "CO2", "BUN", "CREATININE", "GFRAA", "AGRATIO", "LABGLOM", "GLUCOSE", "GLU", "CALCIUM", "BILITOT", "ALKPHOS", "AST", "ALT"    POC Tests: No results for input(s): "POCGLU", "POCNA", "POCK", "POCCL", "POCBUN", "POCHEMO", "POCHCT" in the last 72 hours.    Coags: No results found for: "PROTIME", "INR", "APTT"    HCG (If Applicable): No results found for: "PREGTESTUR", "PREGSERUM", "HCG", "HCGQUANT"     ABGs: No results found for: "PHART", "PO2ART", "PCO2ART", "HCO3ART", "BEART",  "O2SATART"     Type & Screen (If Applicable):  No results found for: "LABABO"    Drug/Infectious Status (If Applicable):  No results found for: "HIV", "HEPCAB"    COVID-19 Screening (If Applicable): No results found for: "COVID19"        Anesthesia Evaluation    Airway: Mallampati: II  TM distance: >3 FB   Neck ROM: full  Mouth opening: > = 3 FB   Dental: normal exam         Pulmonary:normal exam    (+)     sleep apnea: on CPAP,                                  Cardiovascular:  Exercise tolerance: good (>4 METS)  (+) hypertension:        Rhythm: regular  Rate: normal                    Neuro/Psych:   Negative Neuro/Psych ROS              GI/Hepatic/Renal: Neg GI/Hepatic/Renal ROS  (+) morbid obesity          Endo/Other:    (+) : arthritis: OA.[name]                 Abdominal: normal exam            Vascular: negative vascular ROS.         Other Findings:             Anesthesia Plan      spinal     ASA 3       Induction: intravenous.    MIPS: Postoperative opioids intended.  Anesthetic plan and risks discussed with patient.                        Rivka Barbara, MD   01/07/2023

## 2023-01-07 NOTE — Progress Notes (Signed)
OCCUPATIONAL THERAPY EVALUATION/DISCHARGE    Patient: Krista Fletcher (67 y.o. 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 Reviewed hip precautions and patient issued adaptive equipment: reacher, sock aide, LH bath sponge and LH shoe horn and provided education with demonstration for use during LB dressing to increase independence and grade easier while adhering to hip precautions, patient provided return demonstration with Mod I for doff/don socks with AE and verbalized understanding for use of reacher to thread underwear and pants over operated leg first while seated, hike up to knees together for energy conservation to hike together once in stance with RW-provided return demonstration w/ Mod I and pt verbalized understanding to undress operated leg last using reacher when doffing underwear and pants over feet, pt verbalized understanding for use of LH shoe horn and LH bath sponge following simulated demonstration, patient also verbalized understanding for use of reacher for retrieval of items e.g. floor surface to prevent falls.  STS w/ supv, Toilet transfer w/ Supv using RW, pt declined need to void, pt Mod I washing hands in stance at sinkside. Pt sitting up in recliner eating dinner meal, at end of tx session, ice pack intact on L hip-nursing notified, call bell within reach & pt verbalized understanding to utilize for assist  e.g. functional transfers in order to prevent falls.       Maximum therapeutic gains met at current level of care and patient will be discharged from occupational therapy at this time.    Further Equipment Recommendations for Discharge: patient has all recommended DME    AMPAC: AM-PAC Inpatient Daily Activity Raw Score: 23    Current research shows that an AM-PAC score of 18 or greater is associated with a discharge to the patient's home setting.    This AMPAC score should be considered in conjunction with interdisciplinary team recommendations to determine the most appropriate discharge setting. Patient's social support, diagnosis, medical stability, and prior level of function should also be taken into consideration.     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, materials also."    OBJECTIVE DATA SUMMARY:     Past Medical History:   Diagnosis Date    Hypertension     OSA on CPAP 2009     Past Surgical History:   Procedure Laterality Date    OVARIAN CYST SURGERY Left 2005       Home Situation:   Social/Functional History  Lives With: Alone  Type of Home: House  Home Layout: One level  Home Access: Stairs to enter without rails  Entrance Stairs - Number of Steps: 2  Bathroom Shower/Tub: Pension scheme manager: Handicap height  Bathroom Equipment: Grab bars in shower, Academic librarian Accessibility: Accessible  Home Equipment: Cane - Isla Pence, Environmental consultant - Rolling  Has the patient had two or more falls in the past year or any fall with injury in the past year?: No  ADL Assistance: Independent  Ambulation Assistance: Independent  Transfer Assistance: Independent  Active Driver: Yes  Mode of Transportation: Car  Occupation: Full time employment  Type of Occupation: Runner, broadcasting/film/video  []   Right hand dominant   []   Left hand dominant    Cognitive/Behavioral Status:  Orientation  Overall Orientation Status: Within Normal Limits  Orientation Level: Oriented X4  Cognition  Overall Cognitive Status: WNL    Skin: s/p  (L) THR  Edema: s/p (L) THR    Vision/Perceptual:    Vision  Vision: Impaired  Vision Exceptions: Wears glasses at all times       Coordination: BUE  Coordination: Within functional limits       Balance:     Balance  Sitting: Intact  Standing: Impaired;With support  Standing - Static: Good  Standing - Dynamic: Fair (+)    Strength: BUE  Strength: Within functional limits    Tone & Sensation: BUE  Tone: Normal       Range of Motion: BUE  AROM: Within functional limits       Functional Mobility and Transfers for ADLs:  Bed Mobility:     Bed Mobility Training  Bed Mobility Training: No  Transfers:                 Art therapist: Yes  Sit to Stand: Supervision  Stand to Sit: Supervision  Toilet Transfer: Supervision    ADL Assessment:   Feeding: Independent  Grooming: Modified independent   UE Bathing: Supervision  LE Bathing: Supervision  UE Dressing: Modified independent   LE Dressing: Modified independent   Toileting: Modified independent   Functional Mobility: Supervision       Pain:  Intensity Pre-treatment: 7/10   Intensity Post-treatment: 3/10  Scale: Numeric Rating Scale  Location: Left Hip  Quality: Aching  Intervention(s): Nurse notified, Repositioning , Ice, and Rest      Activity Tolerance:   Activity Tolerance: Patient Tolerated treatment well  Please refer to the flowsheet for vital signs taken during this treatment.    After treatment:   [x]  Patient left in no apparent distress sitting up in chair  []  Patient left in no apparent distress in bed  [x]  Call bell left within reach  [x]  Nursing notified  []  Caregiver present  []  Bed alarm activated    COMMUNICATION/EDUCATION:   Patient Education  Education Given To: Patient;Family  Education Provided: Role of Therapy;ADL Adaptive Strategies;Precautions;Transfer Training;Energy Administrator, sports;Fall Prevention Strategies;Equipment;Family Education  Education Method: Demonstration;Verbal;Teach Back  Barriers to Learning:  None  Education Outcome: Verbalized understanding    Thank you for this referral.  Mathis Dad, OT  Minutes: 31    Eval Complexity: Decision Making: Medium Complexity

## 2023-01-07 NOTE — Op Note (Signed)
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 Item Serial No. Manufacturer Lot No. LRB No. Used Action   SHELL ACET SZ D DIA48MM 3 CLUS H TRITANIUM PRESSFIT PRI - [id number]  SHELL ACET SZ D DIA48MM 3 CLUS H TRITANIUM PRESSFIT PRI  [employer] HOWM-WD [id number] Left 1 Implanted   SCREW BNE L25MM DIA6. HEX LO PROF TRIDENT II - [id number]  SCREW BNE L25MM DIA6. HEX LO PROF TRIDENT II  [employer]WD [id number] Left 1 Implanted   INSERT ACET D 0 DEG 36 MM HIP X3 TRIDENT - [id number]  INSERT ACET D 0 DEG 36 MM HIP X3 TRIDENT  [employer]WD [id number] Left 1 Implanted   STEM FEM HI OFFSET 3 HIP CLLRD INSIGNIA - [id number]  STEM FEM HI OFFSET 3 HIP CLLRD INSIGNIA  [employer]WD [id number] Left 1 Implanted   HEAD FEM DIA36MM - OFFSET HIP BIOLOX DELT CERAMIC TAPR - [id number]  HEAD FEM DIA36MM - OFFSET HIP BIOLOX DELT CERAMIC TAPR  [employer] HOWM[employer]WD [id number] Left 1 Implanted         Drains: * No LDAs found *    Findings:  Infection Present At Time Of Surgery (PATOS) (choose all levels that have infection present):  No infection present  Other Findings: See below    Implants:   Stryker:   Femur stem: Insignia size 3, high offset   Acetabulum cup: 48mm   Bearing: Neutral polyethylene   Head: 36 mm, -5 mm offset    A 22 modifier will be billed due to excessive effort needed to dissect through and gain exposure  through excess adipose tissue.  The patient's BMI is 46.      Kaylah Duplain, PAC was present for the procedure and vital for the performance of the procedure. Clerance Lav, PAC assisted with positioning, prepping, draping of the patient before the procedure and instrument manipulation/placement, spinal repositioning and navigation/robotics/flouroscopic operation during the procedure as well as wound closure, dressing application and brace application after the procedure. Please note that no intern, resident, or other hospital staff was available to assist during the surgery      OR Staff:  Circulator: Marni Griffon, RN  Surgical Assistant: Royann Shivers  Radiology Technologist: Dareen Piano  Relief Circulator: Dixon Boos, RN  Relief Scrub: Garald Balding  Scrub Person First: Sharlynn Oliphant  Physician Assistant: Clerance Lav, PA-C  Relief Surgical Assistant: Joycelyn Schmid       INDICATIONS FOR PROCEDURE:  The patient has failed conservative management of their hip arthritis to include activity modification, oral medication. The disease has caused significant impact on their quality of life and wished to undergo surgical management understanding the risk of the surgery and the benefit to relieve pain and restore function following failure  of conservative management.     PROCEDURE:   After informed consent was confirmed in the preoperative holding area, the operative extremity was marked. The patient was brought to the operating room and anesthesia was initiated. The anesthesia team maintained controlled of the head, neck and airway throughout the procedure. The patient was placed in the lateral decubitus position with the operative extremity up and all bony prominences were padded and mechanical dvt prophylaxis was placed on the nonoperative limb. The operative leg was prepped and draped in the usual sterile fashion. A time-out was done to ensure the right patient, side, and procedure to be  performed. The patient received weight-based preoperative antibiotics and received txa.     A standard posterior approach to the hip was performed. Due to the patient's large soft tissue envelope, extra time and care was required to gain and maintain exposure. This added up to 30 minutes of additional surgical time than usually required.  She underwent 8 cm of adipose tissue superficial to the IT band.  Leg lengths were assessed prior to dislocation. The hip was dislocated and the neck was cut at the planned resection level from the lesser trochanter to recreate the desired length and offset based off preoperative templating. The acetabulum was exposed and soft tissue from the joint was removed. After identifying the teardrop, sequential reaming produced a pinch with a bleeding bony surface. After a trial cup was found to be stable, the final cup and liner were placed.  Excellent press-fit was obtained but due to her dysplasia and some superolateral acetabular deficiency of between 10 and 20%, a single additional fixation screw was placed.  The femoral canal was then exposed with internal rotation, flexion and adduction.  The femoral shoulder was cleared and the femoral canal was instrumented.    Sequential reaming and broaching was performed until axial and rotational stability of the broach was apparent. A trial was placed based on our preoperative templating and intraoperative assessment. An intraoperative radiograph was obtained. The hip was ranged to ensure there was no impingement and that it was stable in extension external rotation, position of sleep, deep flexion and flexion internal rotation to over 45 degrees. Leg lengths were assessed by again measuring our residual neck cut and how the limbs felt comparing this with our preoperative template and intraoperative radiograph.    The final femoral stem were placed. A final trial head ball was performed to confirm stability.  The trunnion was cleaned and the  ceramic head ball placed. Leg lengths felt appropriate. A betadine wash was performed. Local infiltrative analgesia was injected into the surrounding tissues. The posterior capsule was repaired though bone tunnels in the posterior trochanter with number 5 ethibond suture tied over a bony bridge. The IT band was closed with #1 vicryl and a running # 2 quill suture. Scarpa's layer was approximated with a #1 Vicryl suture. The dermis was closed with 2-0 monocryl suture followed by a running 2-0 Quill suture in the subcuticular layer. Dermabond and a Mepilex dressing were applied. At the end of the procedure, all counts were correct times two. The patient was transferred to PACU in stable condition. There were no immediate complications.    Post operatively the patient will be weightbearing as tolerated. The patient will attempt to walk on Post operative day 0 and will have chemoprophylaxis for dvt prophylaxis along with early mobilization and mechanical prophylaxis while in house. The patient will remain on our postoperative pain regimen to minimize heavy narcotics.  I was present and scrubbed for all key portions of the procedure.     Electronically Signed Up   Heloise Beecham, DO  01/07/2023  12:28 PM

## 2023-01-07 NOTE — H&P (Signed)
Patient: Krista Fletcher                MRN: [id number]       SSN: [id number]  Date of Birth: 04-05-1956        AGE: 67 y.o.        SEX: female  BMI: Body mass index is 47.07 kg/m.     PCP: Isidore Moos, APRN - NP  01/01/23     Chief Complaint: H&P (Left hip )        1. 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 Yes, change in SS = 21         PMH Obesity (46), HTN, pre-diabetes   Meds amlodipine, baby aspirin, micardis (telmisartan-HCTZ)    Allergies  Sulfa antibiotics    Labs Hgb: 12.7  Plt: 388  Cr: 0.9  GFR: 59 (all other labs show >60- no history of kidney issues, discussed emphasizing hydration up until surgery and refraining from taking meloxicam and other nephrotoxic medications)  Alb: 4.4  A1c: 5.9  UA: neg  UDS: neg  Nicotine: neg      Images EKG, CXR   [facility] [address], [place]  Sent? no      Surgery was discussed with the patient today.  They have failed conservative management of their pathology. The risks and benefits of surgical and conservative (nonsurgical) treatment were discussed at length.  The risks of surgery include but are not limited to pain, scar, infection, painful hardware, hardware failure, fracture, instability, weakness, stiffness, Deep Veinous Thrombosis/Pulmonary Embolism, anesthetic risks including heart attack/stroke, injury to nerves and/or blood vessels, bleeding, the need for further surgery and death.  In the case of fracture repair, the risks also include nonunion or malunion. The recovery from surgery was also discussed at length.  All of the patient's questions were entertained and answered.  Understanding the diagnosis, risks and benefits of surgical treatment, the patient wishes to proceed with surgery.   2.  Pre-op exam  -     AMB POC XRAY, SPINE, LUMBOSACRAL; 2 O  -     AMB POC X-RAY RADEX HIP UNI WITH PELVIS 2-3 VIEWS  3. Morbid obesity (HCC)  4. Hypertension, unspecified type  5. Pre-diabetes           HPI:  Krista Fletcher is a 67 y.o. female with chief complaint of        Chief Complaint   Patient presents with    H&P       Left hip       Left hip pain for several years that used to do well with intra-articular corticosteroid injections bilateral is no longer work.     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 She has severe degeneration and status failed conservative treatment with intra-articular corticosteroid injections, physical therapy, Tylenol, ibuprofen. She can take the occasional Tylenol and ibuprofen which takes the edge off. The first 2 corticosteroid injection she got did help her but subsequently they have not. 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 Her BMI is currently 50  and she weighs 255 pounds. We set a goal weight loss for 30 pounds and will refer her to the bariatric program for weight loss. I like to see her in 2 months to check on her progress and if she has made good progress towards the weight loss goal we will schedule her for left total hip arthroplasty.         IMAGING:  Imaging read by myself and interpreted as follows:     November 30, 2022:  3 view x-ray of the left hip including AP pelvis, AP, and lateral demonstrates severe degenerative changes of the left hip with dysplasia. There is evidence of subchondral sclerosis and subchondral cyst formation through out the femoral head and acetabulum with large osteophytes at the superior and inferior acetabular rim.  Large cyst and osteophyte present at the superior aspect of the head neck junction. The left leg appears to be about 3 cm shorter than the right.      2 view x-ray of the lumbar spine  including sitting and standing demonstrates a change in SS of 21.     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  There is deformation of the femoral head with subchondral sclerosis, subchondral cysts in both the acetabulum and the femoral head.  There are large osteophytes.  The left leg is approximately 3 cm shorter than the right.        PHYSICAL EXAMINATION:  BP 136/78 (Site: Left Upper Arm, Position: Sitting)   Ht 1.524 m (5')   Wt 109.3 kg (241 lb)   BMI 47.07 kg/m   Body mass index is 47.07 kg/m.      Wt Readings from Last 3 Encounters:   11/30/22 109.3 kg (241 lb)   07/24/22 108.9 kg (240 lb)   02/20/22 115.7 kg (255 lb)         GENERAL: Alert and oriented x3, in no acute distress.  HEENT: Normocephalic, atraumatic.    MSK: Left Hip Exam      Tenderness   The patient is experiencing no tenderness.      Range of Motion   Flexion:  100   External rotation:  30   Internal rotation: 0      Muscle Strength   Abduction: 5/5   Adduction: 5/5   Flexion: 5/5      Other   Erythema: absent  Scars: absent  Sensation: normal  Pulse: present     Comments:  Positive FADIR test              ASSESSMENT and PLAN:     December 28, 2022 ADDENDUM:   Pt has agreed to move forward with surgery at [facility] and she has completed all pre-operative labs and imaging and obtained required clearances. There have been no changes to her health since her appointment on November 30, 2022. She ok to move forward with surgery and has been booked for 01/07/23.      November 30, 2022:  The patient originally wanted her case done at the surgery center but due to the complexity of her hip, it will need to be done a [facility]. This was discussed with the patient today and she agreed to move forward at [facility] for the surgery.     The patient was previously indicated for surgery by Dr. Lorin Picket and  has  completed all of their requested preop testing and clearances. The patient has failed all conservative treatment and would like to proceed with having surgery. Based on the patient's co morbidities of obesity (BMI 46- she did not lose the recommended 15 pounds), HTN, and prediabetes, after reviewing all of the required pre-operative labs and imaging, including CXR and EKG, I believe the patient is optimized to procedure with surgery.      The patient and I reviewed their home medications and they were educated on what to take and not to take leading up to their procedure.  They have allergies to SULFA ABX.     Post operative medications were also discussed with the patient, including:  GI side effects including N/V/D, and risk of possible stomach ulcers with meloxicam and aspirin.   risk of constipation, nausea/ vomiting, dizziness with tramadol and oxycodone and that they should wait at least an hour between taking the tramadol and oxycodone as they can increase the chance of respiratory depression when combined.  That they should not take more than 4000 mg of tylenol in one day as it can have negative impacts on the liver at too high of doses and possible toxicity.  All of the patient's questions were answered and they verbalized understanding to the above information.    They [facility], [place], these were NOT sent today.        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  Patient is been working with her primary care provider on weight loss using water aerobics and diet.  She is managing her pain with occasional meloxicam and Voltaren gel as well as activity modification.  She has been lose 15 pounds recently.  I like her to lose an additional 15 pounds but we will start planning for a left total hip arthroplasty through a posterior lateral approach.  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 She has severe degeneration and status failed conservative treatment with intra-articular corticosteroid injections, physical therapy, Tylenol, ibuprofen. She can take the occasional Tylenol and ibuprofen which takes the edge off. The first 2 corticosteroid injection she got did help her but subsequently they have not. 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 Her BMI is currently 50 and she weighs 255 pounds. We set a goal weight loss for 30 pounds and will refer her to the bariatric program for weight loss. I like to see her in 2 months to check on her progress and if she has made good progress towards the weight loss goal we will schedule her for left total hip arthroplasty.           02/20/2022     3:19 PM   AMB PAIN ASSESSMENT   Location of Pain Hip   Location Modifiers Left   Severity of Pain 6           Tobacco Use: Low Risk  (11/30/2022)     Patient History      Smoking Tobacco Use: Never      Smokeless Tobacco Use: Never      Passive Exposure: Never            Past Medical History  Past Medical History:   Diagnosis Date    Hypertension              Family History         Family History   Problem Relation Age of Onset    No Known Problems Mother      No Known Problems Father              Current Facility-Administered Medications          Current Outpatient Medications   Medication Sig Dispense Refill    brimonidine-timolol (COMBIGAN) 0.2-0.5 % ophthalmic solution Combigan 0.2 %-0.5 % eye drops        amLODIPine (NORVASC) 10 MG tablet amlodipine 10 mg tablet        latanoprost (XALATAN) 0.005 % ophthalmic solution latanoprost 0.005 % eye drops        meloxicam (MOBIC) 15 MG tablet meloxicam 15 mg tablet        telmisartan-hydroCHLOROthiazide (MICARDIS HCT) 80-25 MG per tablet telmisartan 80 mg-hydrochlorothiazide 25 mg tablet           No current facility-administered medications for this visit.                 Allergies   Allergen Reactions    Sulfa Antibiotics           Past Surgical History   No past surgical history on file.        Social History               Socioeconomic History    Marital status: Divorced       Spouse name: Not on file    Number of children: Not on file    Years of education: Not on file    Highest education level: Not on file   Occupational History    Not on file   Tobacco Use    Smoking status: Never       Passive exposure: Never    Smokeless tobacco: Never   Vaping Use    Vaping Use: Never used   Substance and Sexual Activity    Alcohol use: Not Currently    Drug use: Never    Sexual activity: Not Currently   Other Topics Concern    Not on file   Social History Narrative    Not on file      Social Determinants of Health      Financial Resource Strain: Not on file   Food Insecurity: Not on file   Transportation Needs: Not on file   Physical Activity: Not on file   Stress: Not on file   Social Connections: Not on file   Intimate Partner Violence: Not on file   Housing Stability: Not on file            REVIEW OF SYSTEMS:       Negative except for that stated above.      @APPTDATE @        Prescription medication management discussed with patient.      Electronically signed by: Clerance Lav, PA-C     Note: This note was completed using voice recognition software.  Any typographical/name errors or mistakes are unintentional.         Electronically signed by Clerance Lav, PA-C at 11/30/2022 11:36 AM  Electronically signed by Clerance Lav, PA-C at 11/30/2022 11:54 AM  Electronically signed by Heloise Beecham, DO at 11/30/2022 11:56 AM  Electronically signed by Clerance Lav, PA-C at  12/28/2022 12:04 PM  Electronically signed by Clerance Lav, PA-C at 01/01/2023  2:33 PM

## 2023-01-07 NOTE — Interval H&P Note (Signed)
Update History & Physical    The patient's History and Physical of January 07, 2023 was reviewed with the patient and I examined the patient. There was no change.  The surgical site was confirmed by the patient and me.     Plan: The risks, benefits, expected outcome, and alternative to the recommended procedure have been discussed with the patient. Patient understands and wants to proceed with the procedure.     Electronically signed by Heloise Beecham, DO on 01/07/2023 at 8:40 AM

## 2023-01-09 ENCOUNTER — Other Ambulatory Visit (HOSPITAL_COMMUNITY): Payer: Self-pay

## 2023-01-09 ENCOUNTER — Encounter: Payer: Self-pay | Admitting: Family Medicine

## 2023-01-09 ENCOUNTER — Other Ambulatory Visit: Payer: Self-pay | Admitting: Family Medicine

## 2023-01-09 DIAGNOSIS — S46812D Strain of other muscles, fascia and tendons at shoulder and upper arm level, left arm, subsequent encounter: Secondary | ICD-10-CM

## 2023-01-09 DIAGNOSIS — E559 Vitamin D deficiency, unspecified: Secondary | ICD-10-CM

## 2023-01-09 DIAGNOSIS — Z9089 Acquired absence of other organs: Secondary | ICD-10-CM

## 2023-01-09 DIAGNOSIS — E210 Primary hyperparathyroidism: Secondary | ICD-10-CM

## 2023-01-09 DIAGNOSIS — E7800 Pure hypercholesterolemia, unspecified: Secondary | ICD-10-CM

## 2023-01-09 DIAGNOSIS — F411 Generalized anxiety disorder: Secondary | ICD-10-CM

## 2023-01-09 DIAGNOSIS — N1831 Chronic kidney disease, stage 3a: Secondary | ICD-10-CM

## 2023-01-10 ENCOUNTER — Other Ambulatory Visit (HOSPITAL_COMMUNITY): Payer: Self-pay

## 2023-01-11 ENCOUNTER — Other Ambulatory Visit (INDEPENDENT_AMBULATORY_CARE_PROVIDER_SITE_OTHER): Payer: 59

## 2023-01-11 ENCOUNTER — Other Ambulatory Visit (HOSPITAL_COMMUNITY): Payer: Self-pay

## 2023-01-11 ENCOUNTER — Ambulatory Visit: Payer: PRIVATE HEALTH INSURANCE | Attending: Orthopaedic Surgery | Primary: Family

## 2023-01-11 DIAGNOSIS — N1831 Chronic kidney disease, stage 3a: Secondary | ICD-10-CM | POA: Diagnosis not present

## 2023-01-11 DIAGNOSIS — E559 Vitamin D deficiency, unspecified: Secondary | ICD-10-CM | POA: Diagnosis not present

## 2023-01-11 DIAGNOSIS — E210 Primary hyperparathyroidism: Secondary | ICD-10-CM

## 2023-01-11 DIAGNOSIS — E7800 Pure hypercholesterolemia, unspecified: Secondary | ICD-10-CM | POA: Diagnosis not present

## 2023-01-11 LAB — MICROALBUMIN / CREATININE URINE RATIO
Creatinine,U: 117.5 mg/dL
Microalb Creat Ratio: 0.6 mg/g (ref 0.0–30.0)
Microalb, Ur: <0.7 mg/dL (ref 0.0–1.9)

## 2023-01-11 LAB — CBC WITH DIFFERENTIAL/PLATELET
Basophils Absolute: 0 10*3/uL (ref 0.0–0.1)
Basophils Relative: 0.8 % (ref 0.0–3.0)
Eosinophils Absolute: 0.1 10*3/uL (ref 0.0–0.7)
Eosinophils Relative: 2.5 % (ref 0.0–5.0)
HCT: 42 % (ref 36.0–46.0)
Hemoglobin: 13.9 g/dL (ref 12.0–15.0)
Lymphocytes Relative: 48.2 % — ABNORMAL HIGH (ref 12.0–46.0)
Lymphs Abs: 1.7 10*3/uL (ref 0.7–4.0)
MCHC: 33.2 g/dL (ref 30.0–36.0)
MCV: 90.5 fl (ref 78.0–100.0)
Monocytes Absolute: 0.4 10*3/uL (ref 0.1–1.0)
Monocytes Relative: 11.2 % (ref 3.0–12.0)
Neutro Abs: 1.3 10*3/uL — ABNORMAL LOW (ref 1.4–7.7)
Neutrophils Relative %: 37.3 % — ABNORMAL LOW (ref 43.0–77.0)
Platelets: 233 10*3/uL (ref 150.0–400.0)
RBC: 4.64 Mil/uL (ref 3.87–5.11)
RDW: 13.8 % (ref 11.5–15.5)
WBC: 3.6 10*3/uL — ABNORMAL LOW (ref 4.0–10.5)

## 2023-01-11 LAB — COMPREHENSIVE METABOLIC PANEL
ALT: 19 U/L (ref 0–35)
AST: 21 U/L (ref 0–37)
Albumin: 4.1 g/dL (ref 3.5–5.2)
Alkaline Phosphatase: 65 U/L (ref 39–117)
BUN: 16 mg/dL (ref 6–23)
CO2: 26 mEq/L (ref 19–32)
Calcium: 9.3 mg/dL (ref 8.4–10.5)
Chloride: 105 mEq/L (ref 96–112)
Creatinine, Ser: 1.1 mg/dL (ref 0.40–1.20)
GFR: 52.58 mL/min — ABNORMAL LOW (ref >60.00)
Glucose, Bld: 92 mg/dL (ref 70–99)
Potassium: 3.9 mEq/L (ref 3.5–5.1)
Sodium: 139 mEq/L (ref 135–145)
Total Bilirubin: 0.6 mg/dL (ref 0.2–1.2)
Total Protein: 7 g/dL (ref 6.0–8.3)

## 2023-01-11 LAB — LIPID PANEL
Cholesterol: 148 mg/dL (ref 0–200)
HDL: 44.6 mg/dL (ref >39.00)
LDL Cholesterol: 88 mg/dL (ref 0–99)
NonHDL: 103.18
Total CHOL/HDL Ratio: 3
Triglycerides: 74 mg/dL (ref 0.0–149.0)
VLDL: 14.8 mg/dL (ref 0.0–40.0)

## 2023-01-11 LAB — VITAMIN D 25 HYDROXY (VIT D DEFICIENCY, FRACTURES): VITD: 28.07 ng/mL — ABNORMAL LOW (ref 30.00–100.00)

## 2023-01-11 LAB — PHOSPHORUS: Phosphorus: 3.4 mg/dL (ref 2.3–4.6)

## 2023-01-11 LAB — TSH: TSH: 2.94 u[IU]/mL (ref 0.35–5.50)

## 2023-01-11 MED ORDER — HYDROXYZINE HCL 25 MG PO TABS
12.5000 mg | ORAL_TABLET | Freq: Two times a day (BID) | ORAL | 0 refills | Status: DC | PRN
Start: 2023-01-11 — End: 2023-01-18
  Filled 2023-01-11: qty 30, 15d supply, fill #0

## 2023-01-11 MED ORDER — ATORVASTATIN CALCIUM 20 MG PO TABS
20.0000 mg | ORAL_TABLET | Freq: Every morning | ORAL | 0 refills | Status: DC
Start: 2023-01-11 — End: 2023-01-18
  Filled 2023-01-11: qty 90, 90d supply, fill #0

## 2023-01-11 NOTE — Telephone Encounter (Signed)
Robaxin Last filled:  08/19/22, #30 Last OV:  07/15/22, 6 mo HTN f/u Next OV:  01/18/23, CPE

## 2023-01-11 NOTE — Telephone Encounter (Incomplete)
Call placed to patient, ID verified x 2.

## 2023-01-13 ENCOUNTER — Other Ambulatory Visit (HOSPITAL_COMMUNITY): Payer: Self-pay

## 2023-01-13 MED ORDER — METHOCARBAMOL 500 MG PO TABS
500.0000 mg | ORAL_TABLET | Freq: Every day | ORAL | 1 refills | Status: DC
Start: 2023-01-13 — End: 2023-12-15
  Filled 2023-01-13: qty 30, 30d supply, fill #0
  Filled 2023-02-09: qty 30, 30d supply, fill #1

## 2023-01-18 ENCOUNTER — Ambulatory Visit: Payer: 59 | Admitting: Family Medicine

## 2023-01-18 ENCOUNTER — Encounter: Payer: Self-pay | Admitting: Family Medicine

## 2023-01-18 ENCOUNTER — Ambulatory Visit: Admit: 2023-01-18 | Discharge: 2023-01-18 | Payer: PRIVATE HEALTH INSURANCE | Primary: Family

## 2023-01-18 DIAGNOSIS — Z96642 Presence of left artificial hip joint: Secondary | ICD-10-CM

## 2023-01-18 DIAGNOSIS — E8940 Asymptomatic postprocedural ovarian failure: Secondary | ICD-10-CM

## 2023-01-18 DIAGNOSIS — E7800 Pure hypercholesterolemia, unspecified: Secondary | ICD-10-CM | POA: Diagnosis not present

## 2023-01-18 DIAGNOSIS — Z23 Encounter for immunization: Secondary | ICD-10-CM | POA: Diagnosis not present

## 2023-01-18 DIAGNOSIS — N1831 Chronic kidney disease, stage 3a: Secondary | ICD-10-CM

## 2023-01-18 DIAGNOSIS — Z9889 Other specified postprocedural states: Secondary | ICD-10-CM | POA: Diagnosis not present

## 2023-01-18 DIAGNOSIS — F411 Generalized anxiety disorder: Secondary | ICD-10-CM

## 2023-01-18 DIAGNOSIS — I10 Essential (primary) hypertension: Secondary | ICD-10-CM | POA: Diagnosis not present

## 2023-01-18 DIAGNOSIS — E559 Vitamin D deficiency, unspecified: Secondary | ICD-10-CM | POA: Diagnosis not present

## 2023-01-18 DIAGNOSIS — Z0000 Encounter for general adult medical examination without abnormal findings: Secondary | ICD-10-CM | POA: Diagnosis not present

## 2023-01-18 DIAGNOSIS — Z9089 Acquired absence of other organs: Secondary | ICD-10-CM

## 2023-01-18 DIAGNOSIS — Z79890 Hormone replacement therapy (postmenopausal): Secondary | ICD-10-CM

## 2023-01-18 DIAGNOSIS — R1900 Intra-abdominal and pelvic swelling, mass and lump, unspecified site: Secondary | ICD-10-CM | POA: Diagnosis not present

## 2023-01-18 NOTE — Progress Notes (Signed)
Patient: Krista Fletcher                MRN: [id number]       SSN: [id number]  Date of Birth: [birth year]        AGE: 67 y.o.        SEX: female  BMI: Body mass index is 46.29 kg/m.    PCP: Isidore Moos, APRN - NP  01/18/23    Chief Complaint: Post-Op Check and Hip Pain (Left total hip arthoplasty, posterolateral )      1. Status post left hip replacement  -     AMB POC X-RAY RADEX HIP UNI WITH PELVIS 2-3 VIEWS  2. Orthopedic aftercare        HPI:  Krista Fletcher is a 67 y.o. female with chief complaint of   Chief Complaint   Patient presents with    Post-Op Check    Hip Pain     Left total hip arthoplasty, posterolateral      DOS SURGERY   January 07, 2023 Left total hip arthroplasty, posterior lateral approach  Implants:                Stryker:                Femur stem: Insignia size 3, high offset                Acetabulum cup: 48mm                Bearing: Neutral polyethylene                Head: 36 mm, -5 mm offset           Left hip pain for several years that used to do well with intra-articular corticosteroid injections bilateral is no longer work.    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 She has severe degeneration and status failed conservative treatment with intra-articular corticosteroid injections, physical therapy, Tylenol, ibuprofen. She can take the occasional Tylenol and ibuprofen which takes the edge off. The first 2 corticosteroid injection she got did help her but subsequently they have not. 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 Her BMI is currently 50 and she weighs 255 pounds. We set a goal weight loss for 30 pounds and will refer her to the bariatric program for weight loss. I like to see her in 2 months to check on her progress and if she has made good progress towards the weight loss goal we will schedule her for left total hip  arthroplasty.       IMAGING:  Imaging read by myself and interpreted as follows:  January 18, 2023:  Three-view x-ray of the left hip including AP pelvis, AP, and lateral demonstrates well-positioned total hip arthroplasty with 1 acetabular screw without evidence of periprosthetic fracture or loosening leg lengths appear to be equal.     November 30, 2022:  3 view x-ray of the left hip including AP pelvis, AP, and lateral demonstrates severe degenerative changes of the left hip with dysplasia. There is evidence of subchondral sclerosis and subchondral cyst formation through out the femoral head and acetabulum with large osteophytes at the superior and inferior acetabular rim.  Large cyst and osteophyte present at the superior aspect of the head neck junction. The left  leg appears to be about 3 cm shorter than the right.     2 view x-ray of the lumbar spine including sitting and standing demonstrates a change in SS of 21.    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  There is deformation of the femoral head with subchondral sclerosis, subchondral cysts in both the acetabulum and the femoral head.  There are large osteophytes.  The left leg is approximately 3 cm shorter than the right.      PHYSICAL EXAMINATION:  Ht 1.524 m (5')   Wt 107.5 kg (237 lb)   BMI 46.29 kg/m   Body mass index is 46.29 kg/m.  Wt Readings from Last 3 Encounters:   01/18/23 107.5 kg (237 lb)   01/04/23 107.5 kg (237 lb)   11/30/22 109.3 kg (241 lb)       GENERAL: Alert and oriented x3, in no acute distress.  HEENT: Normocephalic, atraumatic.    MSK: Left Hip Exam     Tenderness   The patient is experiencing no tenderness.     Range of Motion   Flexion:  110   External rotation:  30   Internal rotation: 5     Muscle Strength   Abduction: 5/5   Adduction: 5/5   Flexion: 5/5     Other    Erythema: absent  Scars: present  Sensation: normal  Pulse: present    Comments:  Posterolateral incision is clean, dry, and intact without erythema or drainage. No ttp around incision. PICO dressing was removed and thrown away today. Quad firing fully. Minimal pain with ROM.            ASSESSMENT and PLAN:    January 18, 2023:  3 days shy of 2 weeks status post left total hip arthroplasty, posterior lateral approach.  Patient is in 0/10 pain today.  She ambulates with a rolling walker.  Her posterolateral incision is clean dry and intact without erythema or drainage with minimal tenderness to palpation.  We will have her follow-up in 4 weeks with Dr. Carolynn Sayers to see how she is doing.    December 28, 2022 ADDENDUM:   Pt has agreed to move forward with surgery at [facility] and she has completed all pre-operative labs and imaging and obtained required clearances. There have been no changes to her health since her appointment on November 30, 2022. She ok to move forward with surgery and has been booked for 01/07/23.     November 30, 2022:  The patient originally wanted her case done at the surgery center but due to the complexity of her hip, it will need to be done a [facility]. This was discussed with the patient today and she agreed to move forward at [facility] for the surgery.    The patient was previously indicated for surgery by Dr. Lorin Picket and has completed all of their requested preop testing and clearances. The patient has failed all conservative treatment and would like to proceed with having surgery. Based on the patient's co morbidities of obesity (BMI 46- she did not lose the recommended 15 pounds), HTN, and prediabetes, after reviewing all of the required pre-operative labs and imaging, including CXR and EKG, I believe the patient is optimized to procedure with surgery.     The patient and I reviewed their home medications and  they were educated on what to take and not to take leading up to their procedure.  They have allergies to  SULFA ABX.    Post operative medications were also discussed with the patient, including:  GI side effects including N/V/D, and risk of possible stomach ulcers with meloxicam and aspirin.   risk of constipation, nausea/ vomiting, dizziness with tramadol and oxycodone and that they should wait at least an hour between taking the tramadol and oxycodone as they can increase the chance of respiratory depression when combined.  That they should not take more than 4000 mg of tylenol in one day as it can have negative impacts on the liver at too high of doses and possible toxicity.  All of the patient's questions were answered and they verbalized understanding to the above information.    They would like their post op medications sent to [facility] on [address], [place], these were NOT sent today.      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  Patient is been working with her primary care provider on weight loss using water aerobics and diet.  She is managing her pain with occasional meloxicam and Voltaren gel as well as activity modification.  She has been lose 15 pounds recently.  I like her to lose an additional 15 pounds but we will start planning for a left total hip arthroplasty through a posterior lateral approach.  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 She has severe degeneration and status failed conservative treatment with intra-articular corticosteroid injections, physical therapy, Tylenol, ibuprofen. She can take the occasional Tylenol and ibuprofen which takes the edge off. The first 2 corticosteroid injection she got did help her but subsequently they have not. 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 Her BMI is currently 50 and she weighs 255 pounds. We set a goal weight  loss for 30 pounds and will refer her to the bariatric program for weight loss. I like to see her in 2 months to check on her progress and if she has made good progress towards the weight loss goal we will schedule her for left total hip arthroplasty.         02/20/2022     3:19 PM   AMB PAIN ASSESSMENT   Location of Pain Hip   Location Modifiers Left   Severity of Pain 6     Tobacco Use: Low Risk  (01/18/2023)    Patient History     Smoking Tobacco Use: Never     Smokeless Tobacco Use: Never     Passive Exposure: Never         Past Medical History:   Diagnosis Date    Hypertension     OSA on CPAP 2009       Family History   Problem Relation Age of Onset    No Known Problems Mother     No Known Problems Father        Current Outpatient Medications   Medication Sig Dispense Refill    telmisartan (MICARDIS) 80 MG tablet Take 1 tablet by mouth daily      hydroCHLOROthiazide (HYDRODIURIL) 25 MG tablet Take 1 tablet by mouth daily      ondansetron (ZOFRAN-ODT) 8 MG TBDP disintegrating  tablet Place 1 tablet under the tongue every 8 hours as needed for Nausea or Vomiting 10 tablet 0    acetaminophen (TYLENOL) 500 MG tablet Take 2 tablets by mouth in the morning and 2 tablets at noon and 2 tablets in the evening. 180 tablet 0    pantoprazole (PROTONIX) 40 MG tablet Take 1 tablet by mouth daily 30 tablet 0    meloxicam (MOBIC) 15 MG tablet Take 1 tablet by mouth daily Start with 1 pill the day before surgery and then daily therafter 30 tablet 1    docusate sodium (COLACE) 100 MG capsule Take 1 capsule by mouth 2 times daily 60 capsule 0    aspirin (ASPIRIN 81) 81 MG chewable tablet Take 1 tablet by mouth 2 times daily 60 tablet 0    amLODIPine (NORVASC) 10 MG tablet Take 1 tablet by mouth daily       No current facility-administered medications for this visit.        Allergies   Allergen Reactions    Sulfa Antibiotics        Past Surgical History:   Procedure Laterality Date    OVARIAN CYST SURGERY Left 2005    TOTAL HIP  ARTHROPLASTY Left 01/07/2023    Left Hip Total Arthroplasty , Posterolateral Approach performed by Heloise Beecham, DO at [facility] MAIN OR       Social History     Socioeconomic History    Marital status: Divorced     Spouse name: Not on file    Number of children: Not on file    Years of education: Not on file    Highest education level: Not on file   Occupational History    Not on file   Tobacco Use    Smoking status: Never     Passive exposure: Never    Smokeless tobacco: Never   Vaping Use    Vaping Use: Never used   Substance and Sexual Activity    Alcohol use: Yes     Comment: Rare    Drug use: Never    Sexual activity: Not Currently   Other Topics Concern    Not on file   Social History Narrative    Not on file     Social Determinants of Health     Financial Resource Strain: Not on file   Food Insecurity: No Food Insecurity (01/07/2023)    Hunger Vital Sign     Worried About Running Out of Food in the Last Year: Never true     Ran Out of Food in the Last Year: Never true   Transportation Needs: No Transportation Needs (01/07/2023)    PRAPARE - Therapist, art (Medical): No     Lack of Transportation (Non-Medical): No   Physical Activity: Not on file   Stress: Not on file   Social Connections: Not on file   Intimate Partner Violence: Not on file   Housing Stability: Low Risk  (01/07/2023)    Housing Stability Vital Sign     Unable to Pay for Housing in the Last Year: No     Number of Places Lived in the Last Year: 1     Unstable Housing in the Last Year: No       REVIEW OF SYSTEMS:      Negative except for that stated above.     @APPTDATE @      Prescription medication management discussed with patient.  Electronically signed by: Clerance Lav, PA-C    Note: This note was completed using voice recognition software.  Any typographical/name errors or mistakes are unintentional.

## 2023-01-18 NOTE — Progress Notes (Signed)
Pt dropped off FMLA paperwork to be completed and signed, she is also requesting it be faxed off. Papers have been scanned into her chart and placed in the HV forms bin.

## 2023-01-18 NOTE — Patient Instructions (Addendum)
Tdap today  Schedule GYN f/u visit.  Consider pneumonia shot next visit.  Bring [place] a copy of your living will to update your chart Return in 6 months for Welcome to Medicare visit.  Good to see you today

## 2023-01-18 NOTE — Progress Notes (Unsigned)
**Note Jacqueline-Identified via Obfuscation** Ph: [phone] Fax: [phone]   Patient ID: Jacqueline Waters, female    DOB: [birth year], 67 y.o.   MRN: [id number]  This visit was conducted in person.  BP 132/70   Pulse 65   Temp (!) 97.2 F (36.2 C) (Temporal)   Ht 5\' 2"  (1.575 m)   Wt 139 lb 8 oz (63.3 kg)   SpO2 99%   BMI 25.51 kg/m    CC: CPE Subjective:   HPI: Jacqueline Waters is a 67 y.o. female presenting on 01/18/2023 for Annual Exam   Has Medicare part A.  S/p breast reduction surgery 05/2021.   S/p minimally invasive parathyroidectomy 12/24/2022 for primary hyperparathyroidism - first ultrasound and nuclear sestamibi parathyroid scan followed by 4D parathyroid neck CT showing 2 areas of L thyroid parenchymal enhancement  - pathology showed parathyroid adenoma. She noticed significant improvement in overall wellbeing a few days after surgery (bone pain, skin complexion, hair regrowth, energy).   Preventative: COLONOSCOPY Date: 2010 WNL Juanda Chance) COLONOSCOPY 03/2019 - TA, HP, diverticulosis rpt 3 yrs ([place])  Colonoscopy 03/2022 - diverticulosis, no polyps, rpt 7 yrs ([place]) Well woman with OBGYN Dr Alvester Morin 03/2022 - s/p hysterectomy, back on estrace 0.25mg  every other day.  [facility] - not eligible  Flu shot yearly  COVID vaccine - Moderna 08/2019, 09/2019, Moderna booster 04/2020, Pfizer 12/2020   Tdap 2014, rpt today  Shingrix - 12/2019, 04/2020 Advanced directive - has this at home. Will bring [place] copy. HCPOA is husband Joe.  Seat belt use discussed  Sunscreen use discussed. 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 Interim medical history since our last visit reviewed. Allergies and medications reviewed and updated. Outpatient Medications Prior to Visit  Medication Sig Dispense Refill   acetaminophen (TYLENOL) 500 MG tablet Take 1 tablet (500 mg total) by mouth daily as needed. 30 tablet 1   atorvastatin (LIPITOR) 20 MG tablet Take 1 tablet (20 mg total) by mouth every morning. 90 tablet 0   benazepril (LOTENSIN) 20 MG tablet Take 1 tablet (20 mg total) by mouth daily. 90 tablet 0   bisoprolol (ZEBETA) 5 MG tablet Take 1 tablet (5 mg total) by mouth daily. 90 tablet 3   Cholecalciferol (VITAMIN D3) 25 MCG (1000 UT) CAPS Take 1,000 Units by mouth daily.     diclofenac Sodium (VOLTAREN) 1 % GEL Apply 2 g topically 4 (four) times daily. (Patient taking differently: Apply 2 g topically daily as needed (pain).) 100 g 2   estradiol (ESTRACE) 0.5 MG tablet Take 1 tablet (0.5 mg total) by mouth daily. 90 tablet 4   hydrOXYzine (ATARAX) 25 MG tablet Take 1/2-1 tablet (12.5-25 mg total) by mouth 2 (two) times daily as needed for anxiety. 30 tablet 0   methocarbamol (ROBAXIN) 500 MG tablet Take 1 tablet (500 mg total) by mouth at bedtime. 30 tablet 1   Multiple Vitamins-Minerals (HAIR SKIN & NAILS PO) Take 1 tablet by mouth daily.  spironolactone (ALDACTONE) 25 MG tablet Take 1 tablet (25 mg total) by mouth daily. 90 tablet 3   traMADol (ULTRAM) 50 MG tablet Take 1 tablet (50 mg total) by mouth every 6 (six) hours as needed for moderate pain. 15 tablet 0   No facility-administered medications prior to visit.     Per HPI unless specifically indicated in ROS section below Review of Systems  Constitutional:  Negative for activity change, appetite change, chills, fatigue, fever and unexpected weight change.  HENT:  Positive for congestion (sinus). Negative for hearing loss.   Eyes:  Negative  for visual disturbance.  Respiratory:  Negative for cough, chest tightness, shortness of breath and wheezing.   Cardiovascular:  Negative for chest pain, palpitations and leg swelling.  Gastrointestinal:  Negative for abdominal distention, abdominal pain, blood in stool, constipation, diarrhea, nausea and vomiting.  Genitourinary:  Negative for difficulty urinating and hematuria.  Musculoskeletal:  Negative for arthralgias, myalgias and neck pain.  Skin:  Negative for rash.  Neurological:  Negative for dizziness, seizures, syncope and headaches.  Hematological:  Negative for adenopathy. Does not bruise/bleed easily.  Psychiatric/Behavioral:  Negative for dysphoric mood. The patient is not nervous/anxious.     Objective:  BP 132/70   Pulse 65   Temp (!) 97.2 F (36.2 C) (Temporal)   Ht 5\' 2"  (1.575 m)   Wt 139 lb 8 oz (63.3 kg)   SpO2 99%   BMI 25.51 kg/m   Wt Readings from Last 3 Encounters:  01/18/23 139 lb 8 oz (63.3 kg)  12/03/22 136 lb 14.5 oz (62.1 kg)  11/23/22 137 lb (62.1 kg)      Physical Exam Vitals and nursing note reviewed.  Constitutional:      Appearance: Normal appearance. She is not ill-appearing.  HENT:     Head: Normocephalic and atraumatic.     Right Ear: Tympanic membrane, ear canal and external ear normal. There is no impacted cerumen.     Left Ear: Tympanic membrane, ear canal and external ear normal. There is no impacted cerumen.     Nose: Nose normal.     Mouth/Throat:     Mouth: Mucous membranes are moist.     Pharynx: Oropharynx is clear. No oropharyngeal exudate or posterior oropharyngeal erythema.  Eyes:     General:        Right eye: No discharge.        Left eye: No discharge.     Extraocular Movements: Extraocular movements intact.     Conjunctiva/sclera: Conjunctivae normal.     Pupils: Pupils are equal, round, and reactive to light.  Neck:     Thyroid: No thyroid mass or thyromegaly.     Vascular: No carotid bruit.  Cardiovascular:      Rate and Rhythm: Normal rate and regular rhythm.     Pulses: Normal pulses.     Heart sounds: Normal heart sounds. No murmur heard. Pulmonary:     Effort: Pulmonary effort is normal. No respiratory distress.     Breath sounds: Normal breath sounds. No wheezing, rhonchi or rales.  Abdominal:     General: Bowel sounds are normal. There is no distension.     Palpations: Abdomen is soft. There is no mass.     Tenderness: There is no abdominal tenderness. There is no guarding or rebound.     Hernia: No hernia is present.  Musculoskeletal:     Cervical back: Normal range of motion and neck supple. No rigidity.  Right lower leg: No edema.     Left lower leg: No edema.  Lymphadenopathy:     Cervical: No cervical adenopathy.  Skin:    General: Skin is warm and dry.     Findings: No rash.  Neurological:     General: No focal deficit present.     Mental Status: She is alert. Mental status is at baseline.  Psychiatric:        Mood and Affect: Mood normal.        Behavior: Behavior normal.       Results for orders placed or performed in visit on 01/11/23  TSH  Result Value Ref Range   TSH 2.94 0.35 - 5.50 uIU/mL  Comprehensive metabolic panel  Result Value Ref Range   Sodium 139 135 - 145 mEq/L   Potassium 3.9 3.5 - 5.1 mEq/L   Chloride 105 96 - 112 mEq/L   CO2 26 19 - 32 mEq/L   Glucose, Bld 92 70 - 99 mg/dL   BUN 16 6 - 23 mg/dL   Creatinine, Ser 8.11 0.40 - 1.20 mg/dL   Total Bilirubin 0.6 0.2 - 1.2 mg/dL   Alkaline Phosphatase 65 39 - 117 U/L   AST 21 0 - 37 U/L   ALT 19 0 - 35 U/L   Total Protein 7.0 6.0 - 8.3 g/dL   Albumin 4.1 3.5 - 5.2 g/dL   GFR 91.47 (L) >82.95 mL/min   Calcium 9.3 8.4 - 10.5 mg/dL  Lipid panel  Result Value Ref Range   Cholesterol 148 0 - 200 mg/dL   Triglycerides 62.1 0.0 - 149.0 mg/dL   HDL 30.86 >57.84 mg/dL   VLDL 69.6 0.0 - 29.5 mg/dL   LDL Cholesterol 88 0 - 99 mg/dL   Total CHOL/HDL Ratio 3    NonHDL 103.18   CBC with  Differential/Platelet  Result Value Ref Range   WBC 3.6 (L) 4.0 - 10.5 K/uL   RBC 4.64 3.87 - 5.11 Mil/uL   Hemoglobin 13.9 12.0 - 15.0 g/dL   HCT 28.4 13.2 - 44.0 %   MCV 90.5 78.0 - 100.0 fl   MCHC 33.2 30.0 - 36.0 g/dL   RDW 10.2 72.5 - 36.6 %   Platelets 233.0 150.0 - 400.0 K/uL   Neutrophils Relative % 37.3 (L) 43.0 - 77.0 %   Lymphocytes Relative 48.2 (H) 12.0 - 46.0 %   Monocytes Relative 11.2 3.0 - 12.0 %   Eosinophils Relative 2.5 0.0 - 5.0 %   Basophils Relative 0.8 0.0 - 3.0 %   Neutro Abs 1.3 (L) 1.4 - 7.7 K/uL   Lymphs Abs 1.7 0.7 - 4.0 K/uL   Monocytes Absolute 0.4 0.1 - 1.0 K/uL   Eosinophils Absolute 0.1 0.0 - 0.7 K/uL   Basophils Absolute 0.0 0.0 - 0.1 K/uL  Parathyroid hormone, intact (no Ca)  Result Value Ref Range   PTH 63 16 - 77 pg/mL  Microalbumin / creatinine urine ratio  Result Value Ref Range   Microalb, Ur <0.7 0.0 - 1.9 mg/dL   Creatinine,U 440.3 mg/dL   Microalb Creat Ratio 0.6 0.0 - 30.0 mg/g  VITAMIN D 25 Hydroxy (Vit-D Deficiency, Fractures)  Result Value Ref Range   VITD 28.07 (L) 30.00 - 100.00 ng/mL  Phosphorus  Result Value Ref Range   Phosphorus 3.4 2.3 - 4.6 mg/dL    Assessment & Plan:   Problem List Items Addressed This Visit   None    No orders of the defined types  were placed in this encounter.   No orders of the defined types were placed in this encounter.   There are no Patient Instructions on file for this visit.  Follow up plan: No follow-ups on file.  Eustaquio Boyden, MD

## 2023-01-19 ENCOUNTER — Other Ambulatory Visit (HOSPITAL_COMMUNITY): Payer: Self-pay

## 2023-01-19 MED ORDER — BENAZEPRIL HCL 20 MG PO TABS
20.0000 mg | ORAL_TABLET | Freq: Every day | ORAL | 4 refills | Status: DC
Start: 2023-01-19 — End: 2023-07-22
  Filled 2023-01-19 – 2023-02-09 (×2): qty 90, 90d supply, fill #0
  Filled 2023-05-12: qty 90, 90d supply, fill #1

## 2023-01-19 MED ORDER — ATORVASTATIN CALCIUM 20 MG PO TABS
20.0000 mg | ORAL_TABLET | Freq: Every morning | ORAL | 4 refills | Status: DC
Start: 2023-01-19 — End: 2023-07-22
  Filled 2023-01-19 – 2023-03-22 (×2): qty 90, 90d supply, fill #0
  Filled 2023-06-19: qty 90, 90d supply, fill #1

## 2023-01-19 MED ORDER — SPIRONOLACTONE 25 MG PO TABS
25.0000 mg | ORAL_TABLET | Freq: Every day | ORAL | 4 refills | Status: DC
Start: 2023-01-19 — End: 2023-07-22
  Filled 2023-01-19: qty 90, 90d supply, fill #0
  Filled 2023-04-19: qty 90, 90d supply, fill #1
  Filled 2023-07-18: qty 90, 90d supply, fill #2

## 2023-01-19 MED ORDER — BISOPROLOL FUMARATE 5 MG PO TABS
5.0000 mg | ORAL_TABLET | Freq: Every day | ORAL | 4 refills | Status: DC
Start: 2023-01-19 — End: 2023-07-22
  Filled 2023-01-19 – 2023-03-22 (×2): qty 90, 90d supply, fill #0
  Filled 2023-06-19: qty 90, 90d supply, fill #1

## 2023-01-19 MED ORDER — HYDROXYZINE HCL 25 MG PO TABS
12.5000 mg | ORAL_TABLET | Freq: Two times a day (BID) | ORAL | 3 refills | Status: DC | PRN
  Filled 2023-01-19 – 2023-08-31 (×2): qty 30, 15d supply, fill #0

## 2023-01-19 NOTE — Assessment & Plan Note (Addendum)
Chronic, she has recently restarted vit D 1000 international units  daily.

## 2023-01-19 NOTE — Assessment & Plan Note (Signed)
Planning GYN f/u for re evaluation.

## 2023-01-19 NOTE — Assessment & Plan Note (Addendum)
Recent PTH normal.

## 2023-01-19 NOTE — Assessment & Plan Note (Addendum)
Chronic, well controlled on current regimen - continue.  

## 2023-01-19 NOTE — Assessment & Plan Note (Signed)
GFR remains 50s. Continue to monitor this.

## 2023-01-19 NOTE — Assessment & Plan Note (Signed)
Continue PRN hydroxyzine. 

## 2023-01-19 NOTE — Assessment & Plan Note (Signed)
Chronic, stable period on atorvastatin 20mg  - continue. The 10-year ASCVD risk score (Arnett DK, et al., 2019) is: 7.9%   Values used to calculate the score:     Age: 67 years     Sex: Female     Is Non-Hispanic African American: Yes     Diabetic: No     Tobacco smoker: No     Systolic Blood Pressure: 132 mmHg     Is BP treated: Yes     HDL Cholesterol: 44.6 mg/dL     Total Cholesterol: 148 mg/dL

## 2023-01-19 NOTE — Assessment & Plan Note (Signed)
Preventative protocols reviewed and updated unless pt declined. Discussed healthy diet and lifestyle.  

## 2023-01-28 ENCOUNTER — Encounter

## 2023-02-08 ENCOUNTER — Ambulatory Visit
Admit: 2023-02-08 | Discharge: 2023-02-08 | Payer: PRIVATE HEALTH INSURANCE | Attending: Orthopaedic Surgery | Primary: Family

## 2023-02-08 DIAGNOSIS — Z96642 Presence of left artificial hip joint: Secondary | ICD-10-CM

## 2023-02-08 NOTE — Progress Notes (Signed)
Patient: Krista Fletcher                MRN: [id number]       SSN: [id number]  Date of Birth: 1956-06-13        AGE: 67 y.o.        SEX: female  BMI: There is no height or weight on file to calculate BMI.    PCP: Krista Moos, APRN - NP  02/08/23    Chief Complaint: Hip Pain (Status post left total hip arthroplasty)      1. Status post left hip replacement  -     External Referral To Physical Therapy  2. Orthopedic aftercare  3. Morbid obesity (HCC)          HPI:  Krista Fletcher is a 67 y.o. female with chief complaint of   Chief Complaint   Patient presents with    Hip Pain     Status post left total hip arthroplasty     DOS SURGERY   January 07, 2023 Left total hip arthroplasty, posterior lateral approach  Implants:                Stryker:                Femur stem: Insignia size 3, high offset                Acetabulum cup: 48mm                Bearing: Neutral polyethylene                Head: 36 mm, -5 mm offset           Left hip pain for several years that used to do well with intra-articular corticosteroid injections bilateral is no longer work.    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 She has severe degeneration and status failed conservative treatment with intra-articular corticosteroid injections, physical therapy, Tylenol, ibuprofen. She can take the occasional Tylenol and ibuprofen which takes the edge off. The first 2 corticosteroid injection she got did help her but subsequently they have not. 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 Her BMI is currently 50 and she weighs 255 pounds. We set a goal weight loss for 30 pounds and will refer her to the bariatric program for weight loss. I like to see her in 2 months to check on her progress and if she has made good progress towards the weight loss goal we will schedule her for left total hip  arthroplasty.       IMAGING:  Imaging read by myself and interpreted as follows:  January 18, 2023:  Three-view x-ray of the left hip including AP pelvis, AP, and lateral demonstrates well-positioned total hip arthroplasty with 1 acetabular screw without evidence of periprosthetic fracture or loosening leg lengths appear to be equal.     November 30, 2022:  3 view x-ray of the left hip including AP pelvis, AP, and lateral demonstrates severe degenerative changes of the left hip with dysplasia. There is evidence of subchondral sclerosis and subchondral cyst formation through out the femoral head and acetabulum with large osteophytes at the superior and inferior acetabular rim.  Large cyst and osteophyte present at the superior aspect of the head neck junction. The left leg  appears to be about 3 cm shorter than the right.     2 view x-ray of the lumbar spine including sitting and standing demonstrates a change in SS of 21.    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  There is deformation of the femoral head with subchondral sclerosis, subchondral cysts in both the acetabulum and the femoral head.  There are large osteophytes.  The left leg is approximately 3 cm shorter than the right.      PHYSICAL EXAMINATION:  There were no vitals taken for this visit.  There is no height or weight on file to calculate BMI.  Wt Readings from Last 3 Encounters:   01/18/23 107.5 kg (237 lb)   01/04/23 107.5 kg (237 lb)   11/30/22 109.3 kg (241 lb)       GENERAL: Alert and oriented x3, in no acute distress.  HEENT: Normocephalic, atraumatic.    MSK: Left Hip Exam     Tenderness   The patient is experiencing no tenderness.     Range of Motion   Flexion:  110   External rotation:  30   Internal rotation: 5     Muscle Strength   Abduction: 5/5   Adduction: 5/5   Flexion: 5/5      Other   Erythema: absent  Scars: present  Sensation: normal  Pulse: present    Comments:  Posterolateral incision is clean, dry, and intact without erythema or drainage. No ttp around incision.  Incision is fully healed.  Patient able to perform active hip flexion against resistance without pain.           ASSESSMENT and PLAN:    February 08, 2023:  1 month status post left total hip arthroplasty through the posterior lateral approach.  She complains of no pain and her incision is well-healed.  She can perform active hip flexion without pain.  She is still using a walker and lacks confidence but she was able to walk without the walker in the office today.  I will start physical therapy for her to help her gain confidence.  I like to see her back in 3 months.  No x-rays will be needed.  She can return to work in 6 weeks    January 18, 2023:  3 days shy of 2 weeks status post left total hip arthroplasty, posterior lateral approach.  Patient is in 0/10 pain today.  She ambulates with a rolling walker.  Her posterolateral incision is clean dry and intact without erythema or drainage with minimal tenderness to palpation.  We will have her follow-up in 4 weeks with Dr. Carolynn Sayers to see how she is doing.    December 28, 2022 ADDENDUM:   Pt has agreed to move forward with surgery at [facility] and she has completed all pre-operative labs and imaging and obtained required clearances. There have been no changes to her health since her appointment on November 30, 2022. She ok to move forward with surgery and has been booked for 01/07/23.     November 30, 2022:  The patient originally wanted her case done at the surgery center but due to the complexity of her hip, it will need to be done a [facility]. This was discussed with the patient today and she agreed to move forward at [facility] for the surgery.  The patient was previously indicated for surgery by Dr. Lorin Picket and has completed all of their requested preop testing and clearances. The patient has  failed all conservative treatment and would like to proceed with having surgery. Based on the patient's co morbidities of obesity (BMI 46- she did not lose the recommended 15 pounds), HTN, and prediabetes, after reviewing all of the required pre-operative labs and imaging, including CXR and EKG, I believe the patient is optimized to procedure with surgery.     The patient and I reviewed their home medications and they were educated on what to take and not to take leading up to their procedure.  They have allergies to SULFA ABX.    Post operative medications were also discussed with the patient, including:  GI side effects including N/V/D, and risk of possible stomach ulcers with meloxicam and aspirin.   risk of constipation, nausea/ vomiting, dizziness with tramadol and oxycodone and that they should wait at least an hour between taking the tramadol and oxycodone as they can increase the chance of respiratory depression when combined.  That they should not take more than 4000 mg of tylenol in one day as it can have negative impacts on the liver at too high of doses and possible toxicity.  All of the patient's questions were answered and they verbalized understanding to the above information.    They would like their post op medications sent to [facility] on [address], [place], these were NOT sent today.      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  Patient is been working with her primary care provider on weight loss using water aerobics and diet.  She is managing her pain with occasional meloxicam and Voltaren gel as well as activity modification.  She has been lose 15 pounds recently.  I like her to lose an additional 15 pounds but we will start planning for a left total hip arthroplasty through a posterior lateral approach.  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    02/20/2022:  67 year old female with left hip  osteoarthritis likely secondary to dysplasia. She has severe degeneration and status failed conservative treatment with intra-articular corticosteroid injections, physical therapy, Tylenol, ibuprofen. She can take the occasional Tylenol and ibuprofen which takes the edge off. The first 2 corticosteroid injection she got did help her but subsequently they have not. 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 Her BMI is currently 50 and she weighs 255 pounds. We set a goal weight loss for 30 pounds and will refer her to the bariatric program for weight loss. I like to see her in 2 months to check on her progress and if she has made good progress towards the weight loss goal we will schedule her for left total hip arthroplasty.         02/20/2022     3:19 PM   AMB PAIN ASSESSMENT   Location of Pain Hip   Location Modifiers Left   Severity of Pain 6     Tobacco Use: Low Risk  (01/18/2023)    Patient History     Smoking Tobacco Use: Never     Smokeless Tobacco Use: Never     Passive Exposure: Never         Past Medical History:   Diagnosis Date    Hypertension  OSA on CPAP 2009       Family History   Problem Relation Age of Onset    No Known Problems Mother     No Known Problems Father        Current Outpatient Medications   Medication Sig Dispense Refill    telmisartan (MICARDIS) 80 MG tablet Take 1 tablet by mouth daily      hydroCHLOROthiazide (HYDRODIURIL) 25 MG tablet Take 1 tablet by mouth daily      ondansetron (ZOFRAN-ODT) 8 MG TBDP disintegrating tablet Place 1 tablet under the tongue every 8 hours as needed for Nausea or Vomiting 10 tablet 0    acetaminophen (TYLENOL) 500 MG tablet Take 2 tablets by mouth in the morning and 2 tablets at noon and 2 tablets in the evening. 180 tablet 0    pantoprazole (PROTONIX) 40 MG tablet Take 1 tablet by mouth daily 30 tablet 0    meloxicam (MOBIC) 15 MG tablet Take 1 tablet by mouth daily Start with 1 pill the day before surgery and  then daily therafter 30 tablet 1    aspirin (ASPIRIN 81) 81 MG chewable tablet Take 1 tablet by mouth 2 times daily 60 tablet 0    amLODIPine (NORVASC) 10 MG tablet Take 1 tablet by mouth daily       No current facility-administered medications for this visit.        Allergies   Allergen Reactions    Sulfa Antibiotics        Past Surgical History:   Procedure Laterality Date    OVARIAN CYST SURGERY Left 2005    TOTAL HIP ARTHROPLASTY Left 01/07/2023    Left Hip Total Arthroplasty , Posterolateral Approach performed by Heloise Beecham, DO at [facility] MAIN OR       Social History     Socioeconomic History    Marital status: Divorced     Spouse name: Not on file    Number of children: Not on file    Years of education: Not on file    Highest education level: Not on file   Occupational History    Not on file   Tobacco Use    Smoking status: Never     Passive exposure: Never    Smokeless tobacco: Never   Vaping Use    Vaping status: Never Used   Substance and Sexual Activity    Alcohol use: Yes     Comment: Rare    Drug use: Never    Sexual activity: Not Currently   Other Topics Concern    Not on file   Social History Narrative    Not on file     Social Determinants of Health     Financial Resource Strain: Not on file   Food Insecurity: No Food Insecurity (01/07/2023)    Hunger Vital Sign     Worried About Running Out of Food in the Last Year: Never true     Ran Out of Food in the Last Year: Never true   Transportation Needs: No Transportation Needs (01/07/2023)    PRAPARE - Therapist, art (Medical): No     Lack of Transportation (Non-Medical): No   Physical Activity: Not on file   Stress: Not on file   Social Connections: Not on file   Intimate Partner Violence: Not on file   Housing Stability: Low Risk  (01/07/2023)    Housing Stability Vital Sign     Unable to Pay  for Housing in the Last Year: No     Number of Places Lived in the Last Year: 1     Unstable Housing in the Last Year: No        REVIEW OF SYSTEMS:      Negative except for that stated above.     @APPTDATE @      Prescription medication management discussed with patient.     Electronically signed by: Heloise Beecham, DO    Note: This note was completed using voice recognition software.  Any typographical/name errors or mistakes are unintentional.

## 2023-02-09 ENCOUNTER — Other Ambulatory Visit: Payer: Self-pay

## 2023-02-09 ENCOUNTER — Other Ambulatory Visit: Payer: Self-pay | Admitting: Physician Assistant

## 2023-02-09 ENCOUNTER — Other Ambulatory Visit (HOSPITAL_COMMUNITY): Payer: Self-pay

## 2023-02-26 NOTE — Telephone Encounter (Signed)
 Letter fax to [phone], with completed fax sheet

## 2023-02-26 NOTE — Telephone Encounter (Signed)
 Patient called to ask if she can get a work note releasing her back to work full duty as of 03/15/2023. The only restriction she would like to have is to wear tennis shoes and use her cane when necessary. If possible, she would like it sent to     Fax: [phone]  Attn: Mare Daring

## 2023-02-28 ENCOUNTER — Encounter

## 2023-03-01 MED ORDER — MELOXICAM 15 MG PO TABS
15 | ORAL_TABLET | Freq: Every day | ORAL | 1 refills | Status: DC
Start: 2023-03-01 — End: 2023-05-17

## 2023-03-02 NOTE — Telephone Encounter (Signed)
 Patient called and stated that she needs her work letter updated showing her return to work date 03/15/2023 and faxed back to her employer.    Patient can be reached at [phone].

## 2023-03-02 NOTE — Telephone Encounter (Signed)
 03/02/23 Patient was called today to let her know we were faxing over her work letter to her employer.

## 2023-03-10 ENCOUNTER — Other Ambulatory Visit (HOSPITAL_COMMUNITY): Payer: Self-pay

## 2023-03-22 ENCOUNTER — Other Ambulatory Visit (HOSPITAL_COMMUNITY): Payer: Self-pay

## 2023-04-06 ENCOUNTER — Encounter: Payer: Self-pay | Admitting: Family Medicine

## 2023-04-06 ENCOUNTER — Ambulatory Visit: Payer: HMO | Attending: Family Medicine

## 2023-04-06 ENCOUNTER — Ambulatory Visit (INDEPENDENT_AMBULATORY_CARE_PROVIDER_SITE_OTHER): Payer: 59 | Admitting: Family Medicine

## 2023-04-06 VITALS — BP 136/64 | HR 57 | Temp 97.9°F | Ht 62.0 in | Wt 142.0 lb

## 2023-04-06 DIAGNOSIS — E7800 Pure hypercholesterolemia, unspecified: Secondary | ICD-10-CM | POA: Diagnosis not present

## 2023-04-06 DIAGNOSIS — I10 Essential (primary) hypertension: Secondary | ICD-10-CM | POA: Diagnosis not present

## 2023-04-06 DIAGNOSIS — R002 Palpitations: Secondary | ICD-10-CM | POA: Diagnosis not present

## 2023-04-06 DIAGNOSIS — Z8249 Family history of ischemic heart disease and other diseases of the circulatory system: Secondary | ICD-10-CM

## 2023-04-06 NOTE — Progress Notes (Signed)
Ph: [phone] Fax: [phone]   Patient ID: Jacqueline Waters, female    DOB: 1956-05-11, 67 y.o.   MRN: [id number]  This visit was conducted in person.  BP 136/64   Pulse (!) 57   Temp 97.9 F (36.6 C) (Oral)   Ht 5\' 2"  (1.575 m)   Wt 142 lb (64.4 kg)   SpO2 99%   BMI 25.97 kg/m    Chief Complaint  Patient presents with   Palpitations    C/o palpitations/fluttering and tightness. Sxs started about 2 wks ago. Stopped estrogen about 1.5 wks ago and fluttering stopped. Last EKG- 11/23/22.     Subjective:   Discussed the use of AI scribe software for clinical note transcription with the patient, who gave verbal consent to proceed.  History of Present Illness   Jacqueline Waters, a 67 year old female with a history of hypertension, chronic kidney disease, and primary hyperparathyroidism, presents with a new symptom of chest fluttering or palpitations. The palpitations began two weeks ago and were not associated with exertion, occurring at rest. She describes them as a "fluttering" sensation lasting less than a minute. Concurrently, she reports a "funny feeling" and fatigue, but no pain. The palpitations ceased a week and a half ago when she stopped taking her estradiol (0.5mg  every other day), which she had been on through her Gynecologist. Despite the cessation of palpitations, she continues to experience the "funny feeling" and fatigue. The patient also reports feeling tired after a busy weekend but did not experience any palpitations during this time.          Relevant past medical, surgical, family and social history reviewed and updated as indicated. Interim medical history since our last visit reviewed. Allergies and medications reviewed and updated. Outpatient Medications Prior to Visit  Medication Sig Dispense Refill   acetaminophen (TYLENOL) 500 MG tablet Take 1 tablet (500 mg total) by mouth daily as needed. 30 tablet 1   atorvastatin (LIPITOR) 20 MG tablet Take 1 tablet (20  mg total) by mouth every morning. 90 tablet 4   benazepril (LOTENSIN) 20 MG tablet Take 1 tablet (20 mg total) by mouth daily. 90 tablet 4   bisoprolol (ZEBETA) 5 MG tablet Take 1 tablet (5 mg total) by mouth daily. 90 tablet 4   Cholecalciferol (VITAMIN D3) 25 MCG (1000 UT) CAPS Take 1,000 Units by mouth daily.     diclofenac Sodium (VOLTAREN) 1 % GEL Apply 2 g topically 4 (four) times daily. (Patient taking differently: Apply 2 g topically daily as needed (pain).) 100 g 2   hydrOXYzine (ATARAX) 25 MG tablet Take 1/2-1 tablet (12.5-25 mg total) by mouth 2 (two) times daily as needed for anxiety. 30 tablet 3   methocarbamol (ROBAXIN) 500 MG tablet Take 1 tablet (500 mg total) by mouth at bedtime. 30 tablet 1   Multiple Vitamins-Minerals (HAIR SKIN & NAILS PO) Take 1 tablet by mouth daily.     spironolactone (ALDACTONE) 25 MG tablet Take 1 tablet (25 mg total) by mouth daily. 90 tablet 4   estradiol (ESTRACE) 0.5 MG tablet Take 1 tablet (0.5 mg total) by mouth daily. 90 tablet 4   No facility-administered medications prior to visit.     Per HPI unless specifically indicated in ROS section below Review of Systems  Objective:  BP 136/64   Pulse (!) 57   Temp 97.9 F (36.6 C) (Oral)   Ht 5\' 2"  (1.575 m)   Wt 142 lb (64.4 kg)   SpO2  99%   BMI 25.97 kg/m   Wt Readings from Last 3 Encounters:  04/06/23 142 lb (64.4 kg)  01/18/23 139 lb 8 oz (63.3 kg)  12/03/22 136 lb 14.5 oz (62.1 kg)      Physical Exam   HEENT: Pupils equally round and reactive to light and accommodation. Mouth with moist mucous membranes without pharyngeal erythema or exudates. NECK: No thyromegaly or masses. Midline parathyroidectomy scar. CHEST: Lung exam cleared auscultation bilaterally without crackles or wheezing. CARDIOVASCULAR: Normal S1, S2, sounds regular without any murmurs, rubs or gallops. EXTREMITIES: No pedal edema.          Results   DIAGNOSTIC EKG: Sinus bradycardia, rate in the 40s, normal  axis, normal intervals, no hypertrophy or acute ST or T wave changes, good R wave progression (04/06/2023)       Lab Results  Component Value Date   NA 139 01/11/2023   CL 105 01/11/2023   K 3.9 01/11/2023   CO2 26 01/11/2023   BUN 16 01/11/2023   CREATININE 1.10 01/11/2023   GFR 52.58 (L) 01/11/2023   CALCIUM 9.3 01/11/2023   PHOS 3.4 01/11/2023   ALBUMIN 4.1 01/11/2023   GLUCOSE 92 01/11/2023    Lab Results  Component Value Date   TSH 2.94 01/11/2023   Lab Results  Component Value Date   CHOL 148 01/11/2023   HDL 44.60 01/11/2023   LDLCALC 88 01/11/2023   LDLDIRECT 165.6 07/25/2013   TRIG 74.0 01/11/2023   CHOLHDL 3 01/11/2023   Assessment & Plan:      Palpitations New onset palpitations described as fluttering, lasting less than a minute, occurring at rest. No associated chest pain, shortness of breath, dizziness, or vision changes. Symptoms improved after discontinuation of estradiol. EKG showed sinus bradycardia with a rate in the 40s, normal axis, intervals, and no acute ST or T changes. -Order 14-day Zio patch for further evaluation of palpitations. -Continue current medications including benazepril 20mg  daily, bisoprolol 5mg  daily, and spironolactone 25mg  daily.  Cardiovascular Risk Family history of heart disease with father deceased from MI at age 57 and mother with coronary artery disease status post bypass at age 107. Patient has risk factors including hypertension hyperlipidemia and CKD. -Order coronary CT scan with calcium score to assess for coronary artery disease.  Hypertension Well controlled on current regimen of benazepril 20mg  daily, bisoprolol 5mg  daily, and spironolactone 25mg  daily. -Continue current antihypertensive regimen.  Estrogen Replacement Therapy Patient self-discontinued estradiol 0.5mg  every other day due to onset of palpitations. No worsening hot flashes reported since discontinuation. -Advise patient to remain off estradiol given  improvement in palpitations.        Problem List Items Addressed This Visit     HYPERCHOLESTEROLEMIA   Relevant Orders   CT CARDIAC SCORING (SELF PAY ONLY)   Essential hypertension   Family history of premature CAD   Relevant Orders   CT CARDIAC SCORING (SELF PAY ONLY)   Palpitations - Primary   Relevant Orders   EKG 12-Lead (Completed)   LONG TERM MONITOR (3-14 DAYS)     No orders of the defined types were placed in this encounter.   Orders Placed This Encounter  Procedures   CT CARDIAC SCORING (SELF PAY ONLY)    Standing Status:   Future    Standing Expiration Date:   04/05/2024    Order Specific Question:   Preferred imaging location?    Answer:   [facility]   LONG TERM MONITOR (3-14 DAYS)  Standing Status:   Future    Number of Occurrences:   1    Standing Expiration Date:   04/05/2024    Order Specific Question:   Where should this test be performed?    Answer:   [facility]    Order Specific Question:   Does the patient have an implanted cardiac device?    Answer:   No    Order Specific Question:   Prescribed days of wear    Answer:   15    Order Specific Question:   Type of enrollment    Answer:   Home Enrollment    Order Specific Question:   Vendor:    Answer:   [employer]   EKG 12-Lead    Patient Instructions  EKG today Stay off estrogen.   VISIT SUMMARY: Dear Mrs. Dabney, during your recent visit, we discussed your new symptom of chest fluttering or palpitations, which you described as a 'fluttering' sensation lasting less than a minute. You also reported a 'funny feeling' and fatigue. We noted that these symptoms improved after you stopped taking estradiol. We also discussed your family history of heart disease and your well-controlled hypertension.  YOUR PLAN: -PALPITATIONS: Palpitations are feelings of a rapid, fluttering, or pounding heart. We will further evaluate this with a 14-day Zio patch, a device that monitors your heart rhythm. Please  continue your current medications.  -CARDIOVASCULAR RISK: Given your family history of heart disease, we will order a coronary CT scan with a calcium score. This test helps [place] assess for any blockages in your heart arteries.  -HYPERTENSION: Hypertension, or high blood pressure, is well controlled with your current medications. Please continue taking them as prescribed.  -ESTROGEN REPLACEMENT THERAPY: You stopped taking estradiol, a form of estrogen, due to the onset of palpitations. Since your symptoms improved, we advise you to remain off this medication.  INSTRUCTIONS: Please wear the Zio patch for 14 days as instructed for [place] to monitor your heart rhythm. Continue taking your current medications for hypertension. We will also schedule a coronary CT scan to assess your heart health. Please remain off estradiol for now, and let us know if you experience any worsening symptoms.  Follow up plan: Return if symptoms worsen or fail to improve.  Eustaquio Boyden, MD

## 2023-04-06 NOTE — Patient Instructions (Addendum)
EKG today Stay off estrogen.   VISIT SUMMARY: Dear Mrs. Zuloaga, during your recent visit, we discussed your new symptom of chest fluttering or palpitations, which you described as a 'fluttering' sensation lasting less than a minute. You also reported a 'funny feeling' and fatigue. We noted that these symptoms improved after you stopped taking estradiol. We also discussed your family history of heart disease and your well-controlled hypertension.  YOUR PLAN: -PALPITATIONS: Palpitations are feelings of a rapid, fluttering, or pounding heart. We will further evaluate this with a 14-day Zio patch, a device that monitors your heart rhythm. Please continue your current medications.  -CARDIOVASCULAR RISK: Given your family history of heart disease, we will order a coronary CT scan with a calcium score. This test helps [place] assess for any blockages in your heart arteries.  -HYPERTENSION: Hypertension, or high blood pressure, is well controlled with your current medications. Please continue taking them as prescribed.  -ESTROGEN REPLACEMENT THERAPY: You stopped taking estradiol, a form of estrogen, due to the onset of palpitations. Since your symptoms improved, we advise you to remain off this medication.  INSTRUCTIONS: Please wear the Zio patch for 14 days as instructed for [place] to monitor your heart rhythm. Continue taking your current medications for hypertension. We will also schedule a coronary CT scan to assess your heart health. Please remain off estradiol for now, and let us know if you experience any worsening symptoms.

## 2023-04-09 ENCOUNTER — Encounter: Payer: Self-pay | Admitting: *Deleted

## 2023-04-12 ENCOUNTER — Ambulatory Visit: Payer: PRIVATE HEALTH INSURANCE | Attending: Orthopaedic Surgery | Primary: Family

## 2023-04-13 DIAGNOSIS — R002 Palpitations: Secondary | ICD-10-CM | POA: Diagnosis not present

## 2023-04-16 ENCOUNTER — Encounter: Payer: Self-pay | Admitting: Family Medicine

## 2023-04-19 ENCOUNTER — Other Ambulatory Visit (HOSPITAL_COMMUNITY): Payer: Self-pay

## 2023-04-19 ENCOUNTER — Ambulatory Visit
Admission: RE | Admit: 2023-04-19 | Discharge: 2023-04-19 | Disposition: A | Discharge status: Home / Self Care | Payer: HMO | Source: Ambulatory Visit | Attending: Family Medicine | Admitting: Family Medicine

## 2023-04-19 DIAGNOSIS — Z8249 Family history of ischemic heart disease and other diseases of the circulatory system: Secondary | ICD-10-CM | POA: Insufficient documentation

## 2023-04-19 DIAGNOSIS — I700 Atherosclerosis of aorta: Secondary | ICD-10-CM | POA: Diagnosis not present

## 2023-04-19 DIAGNOSIS — E7800 Pure hypercholesterolemia, unspecified: Secondary | ICD-10-CM | POA: Insufficient documentation

## 2023-04-22 DIAGNOSIS — R002 Palpitations: Secondary | ICD-10-CM | POA: Diagnosis not present

## 2023-04-26 ENCOUNTER — Ambulatory Visit
Admit: 2023-04-26 | Discharge: 2023-04-26 | Payer: PRIVATE HEALTH INSURANCE | Attending: Orthopaedic Surgery | Primary: Family

## 2023-04-26 VITALS — Ht 60.0 in | Wt 237.0 lb

## 2023-04-26 DIAGNOSIS — Z96642 Presence of left artificial hip joint: Secondary | ICD-10-CM

## 2023-04-26 NOTE — Progress Notes (Signed)
Patient: Krista Fletcher                MRN: [id number]       SSN: [id number]  Date of Birth: 1955-12-16        AGE: 67 y.o.        SEX: female  BMI: Body mass index is 46.29 kg/m.    PCP: Isidore Moos, APRN - NP  04/26/23    Chief Complaint: Hip Pain (January 07, 2023 Left total hip arthroplasty, posterior lateral//)      1. Status post left hip replacement  2. Orthopedic aftercare  3. Morbid obesity            HPI:  Krista Fletcher is a 67 y.o. female with chief complaint of   Chief Complaint   Patient presents with    Hip Pain     January 07, 2023 Left total hip arthroplasty, posterior lateral         DOS SURGERY   January 07, 2023 Left total hip arthroplasty, posterior lateral approach  Implants:                [employer]:                Femur stem: Insignia size 3, high offset                Acetabulum cup: 48mm                Bearing: Neutral polyethylene                Head: 36 mm, -5 mm offset           Left hip pain for several years that used to do well with intra-articular corticosteroid injections bilateral is no longer work.    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 She has severe degeneration and status failed conservative treatment with intra-articular corticosteroid injections, physical therapy, Tylenol, ibuprofen. She can take the occasional Tylenol and ibuprofen which takes the edge off. The first 2 corticosteroid injection she got did help her but subsequently they have not. 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 Her BMI is currently 50 and she weighs 255 pounds. We set a goal weight loss for 30 pounds and will refer her to the bariatric program for weight loss. I like to see her in 2 months to check on her progress and if she has made good progress towards the weight loss goal we will schedule her for left total hip arthroplasty.        IMAGING:  Imaging read by myself and interpreted as follows:    January 18, 2023:  Three-view x-ray of the left hip including AP pelvis, AP, and lateral demonstrates well-positioned total hip arthroplasty with 1 acetabular screw without evidence of periprosthetic fracture or loosening leg lengths appear to be equal.     November 30, 2022:  3 view x-ray of the left hip including AP pelvis, AP, and lateral demonstrates severe degenerative changes of the left hip with dysplasia. There is evidence of subchondral sclerosis and subchondral cyst formation through out the femoral head and acetabulum with large osteophytes at the superior and inferior acetabular rim.  Large cyst and osteophyte present at the superior aspect of the head neck junction. The left leg appears to be  about 3 cm shorter than the right.     2 view x-ray of the lumbar spine including sitting and standing demonstrates a change in SS of 21.    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  There is deformation of the femoral head with subchondral sclerosis, subchondral cysts in both the acetabulum and the femoral head.  There are large osteophytes.  The left leg is approximately 3 cm shorter than the right.      PHYSICAL EXAMINATION:  Ht 1.524 m (5')   Wt 107.5 kg (237 lb)   BMI 46.29 kg/m   Body mass index is 46.29 kg/m.  Wt Readings from Last 3 Encounters:   04/26/23 107.5 kg (237 lb)   01/18/23 107.5 kg (237 lb)   01/04/23 107.5 kg (237 lb)       GENERAL: Alert and oriented x3, in no acute distress.  HEENT: Normocephalic, atraumatic.    MSK: Left Hip Exam     Tenderness   The patient is experiencing no tenderness.     Range of Motion   Flexion:  110   External rotation:  50   Internal rotation: 20     Muscle Strength   Abduction: 5/5   Adduction: 5/5   Flexion: 5/5     Other    Erythema: absent  Scars: present  Sensation: normal  Pulse: present    Comments:  Posterolateral incision is clean, dry, and intact without erythema or drainage. No ttp around incision.  Incision is fully healed.  Patient able to perform active hip flexion against resistance without pain.           ASSESSMENT and PLAN:    April 26, 2023:  Over 4 months status post left total hip arthroplasty through posterior lateral approach.  Patient is very happy there surgery and has been able to exercise and is started losing weight.  She has no issues and no pain.  She still feels like her left leg is a little longer than the right but I did explain to her it does take some time to her muscle to get used to the change in leg lengths and she had such severe leg shortening preoperatively.  Would like to see her again next July when she is 1 year out from surgery and get repeat x-rays of the left hip at that time.      February 08, 2023:  1 month status post left total hip arthroplasty through the posterior lateral approach.  She complains of no pain and her incision is well-healed.  She can perform active hip flexion without pain.  She is still using a walker and lacks confidence but she was able to walk without the walker in the office today.  I will start physical therapy for her to help her gain confidence.  I like to see her back in 3 months.  No x-rays will be needed.  She can return to work in 6 weeks    January 18, 2023:  3 days shy of 2 weeks status post left total hip arthroplasty, posterior lateral approach.  Patient is in 0/10 pain today.  She ambulates with a rolling walker.  Her posterolateral incision is clean dry and intact without erythema or drainage with minimal tenderness to palpation.  We will have her follow-up in 4  weeks with Dr. Carolynn Sayers to see how she is doing.    December 28, 2022 ADDENDUM:   Pt has agreed to move forward with surgery at [facility] and she has completed all pre-operative labs and imaging and  obtained required clearances. There have been no changes to her health since her appointment on November 30, 2022. She ok to move forward with surgery and has been booked for 01/07/23.     November 30, 2022:  The patient originally wanted her case done at the surgery center but due to the complexity of her hip, it will need to be done a [facility]. This was discussed with the patient today and she agreed to move forward at [facility] for the surgery.    The patient was previously indicated for surgery by Dr. Lorin Picket and has completed all of their requested preop testing and clearances. The patient has failed all conservative treatment and would like to proceed with having surgery. Based on the patient's co morbidities of obesity (BMI 46- she did not lose the recommended 15 pounds), HTN, and prediabetes, after reviewing all of the required pre-operative labs and imaging, including CXR and EKG, I believe the patient is optimized to procedure with surgery.     The patient and I reviewed their home medications and they were educated on what to take and not to take leading up to their procedure.  They have allergies to SULFA ABX.    Post operative medications were also discussed with the patient, including:  GI side effects including N/V/D, and risk of possible stomach ulcers with meloxicam and aspirin.   risk of constipation, nausea/ vomiting, dizziness with tramadol and oxycodone and that they should wait at least an hour between taking the tramadol and oxycodone as they can increase the chance of respiratory depression when combined.  That they should not take more than 4000 mg of tylenol in one day as it can have negative impacts on the liver at too high of doses and possible toxicity.  All of the patient's questions were answered and they verbalized understanding to the above information.    They would [facility] [address], [place], these were NOT sent today.      July 24, 2022:  Severe  left hip osteoarthritis secondary to dysplasia.  Patient is been working with her primary care provider on weight loss using water aerobics and diet.  She is managing her pain with occasional meloxicam and Voltaren gel as well as activity modification.  She has been lose 15 pounds recently.  I like her to lose an additional 15 pounds but we will start planning for a left total hip arthroplasty through a posterior lateral approach.  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 She has severe degeneration and status failed conservative treatment with intra-articular corticosteroid injections, physical therapy, Tylenol, ibuprofen. She can take the occasional Tylenol and ibuprofen which takes the edge off. The first 2 corticosteroid injection she got did help her but subsequently they have not. 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 Her BMI is currently 50 and she weighs 255 pounds. We set a goal weight loss for 30 pounds and will refer her to the bariatric program for weight loss. I like to  see her in 2 months to check on her progress and if she has made good progress towards the weight loss goal we will schedule her for left total hip arthroplasty.         02/20/2022     3:19 PM   AMB PAIN ASSESSMENT   Location of Pain Hip   Location Modifiers Left   Severity of Pain 6     Tobacco Use: Low Risk  (04/26/2023)    Patient History     Smoking Tobacco Use: Never     Smokeless Tobacco Use: Never     Passive Exposure: Never         Past Medical History:   Diagnosis Date    Hypertension     OSA on CPAP 2009       Family History   Problem Relation Age of Onset    No Known Problems Mother     No Known Problems Father        Current Outpatient Medications   Medication Sig Dispense Refill    meloxicam (MOBIC) 15 MG tablet TAKE 1 TABLET  BY MOUTH DAILY START WITH 1 PILL THE DAY BEFORE SURGERY AND THEN DAILY THERAFTER 30 tablet 1    telmisartan (MICARDIS) 80 MG tablet Take 1 tablet by mouth daily      amLODIPine (NORVASC) 10 MG tablet Take 1 tablet by mouth daily      hydroCHLOROthiazide (HYDRODIURIL) 25 MG tablet Take 1 tablet by mouth daily      ondansetron (ZOFRAN-ODT) 8 MG TBDP disintegrating tablet Place 1 tablet under the tongue every 8 hours as needed for Nausea or Vomiting 10 tablet 0    acetaminophen (TYLENOL) 500 MG tablet Take 2 tablets by mouth in the morning and 2 tablets at noon and 2 tablets in the evening. 180 tablet 0    pantoprazole (PROTONIX) 40 MG tablet Take 1 tablet by mouth daily 30 tablet 0    aspirin (ASPIRIN 81) 81 MG chewable tablet Take 1 tablet by mouth 2 times daily 60 tablet 0     No current facility-administered medications for this visit.        Allergies   Allergen Reactions    Sulfa Antibiotics        Past Surgical History:   Procedure Laterality Date    OVARIAN CYST SURGERY Left 2005    TOTAL HIP ARTHROPLASTY Left 01/07/2023    Left Hip Total Arthroplasty , Posterolateral Approach performed by Heloise Beecham, DO at [facility] MAIN OR       Social History     Socioeconomic History    Marital status: Divorced     Spouse name: Not on file    Number of children: Not on file    Years of education: Not on file    Highest education level: Not on file   Occupational History    Not on file   Tobacco Use    Smoking status: Never     Passive exposure: Never    Smokeless tobacco: Never   Vaping Use    Vaping status: Never Used   Substance and Sexual Activity    Alcohol use: Yes     Comment: Rare    Drug use: Never    Sexual activity: Not Currently   Other Topics Concern    Not on file   Social History Narrative    Not on file     Social Determinants of Health     Financial Resource Strain:  Not on file   Food Insecurity: No Food Insecurity (01/07/2023)    Hunger Vital Sign     Worried About Running Out of Food in the Last Year:  Never true     Ran Out of Food in the Last Year: Never true   Transportation Needs: No Transportation Needs (01/07/2023)    PRAPARE - Therapist, art (Medical): No     Lack of Transportation (Non-Medical): No   Physical Activity: Not on file   Stress: Not on file   Social Connections: Not on file   Intimate Partner Violence: Not on file   Housing Stability: Low Risk  (01/07/2023)    Housing Stability Vital Sign     Unable to Pay for Housing in the Last Year: No     Number of Places Lived in the Last Year: 1     Unstable Housing in the Last Year: No       REVIEW OF SYSTEMS:      Negative except for that stated above.     @APPTDATE @      Prescription medication management discussed with patient.     Electronically signed by: Heloise Beecham, DO    Note: This note was completed using voice recognition software.  Any typographical/name errors or mistakes are unintentional.

## 2023-05-16 ENCOUNTER — Encounter: Admit: 2023-05-16

## 2023-05-16 DIAGNOSIS — M1632 Unilateral osteoarthritis resulting from hip dysplasia, left hip: Secondary | ICD-10-CM

## 2023-05-17 MED ORDER — MELOXICAM 15 MG PO TABS
15 MG | ORAL_TABLET | Freq: Every day | ORAL | 1 refills | Status: AC
Start: 2023-05-17 — End: 2023-07-16

## 2023-05-21 ENCOUNTER — Other Ambulatory Visit (HOSPITAL_COMMUNITY): Payer: Self-pay

## 2023-05-21 ENCOUNTER — Telehealth: Payer: HMO | Admitting: Family Medicine

## 2023-05-21 ENCOUNTER — Ambulatory Visit: Payer: HMO | Admitting: Nurse Practitioner

## 2023-05-21 DIAGNOSIS — J069 Acute upper respiratory infection, unspecified: Secondary | ICD-10-CM | POA: Diagnosis not present

## 2023-05-21 MED ORDER — BENZONATATE 200 MG PO CAPS
200.0000 mg | ORAL_CAPSULE | Freq: Two times a day (BID) | ORAL | 0 refills | Status: DC | PRN
  Filled 2023-05-21: qty 20, 10d supply, fill #0

## 2023-05-21 MED ORDER — AZITHROMYCIN 250 MG PO TABS
ORAL_TABLET | ORAL | 0 refills | Status: AC
  Filled 2023-05-21: qty 6, 5d supply, fill #0

## 2023-05-21 NOTE — Progress Notes (Signed)

## 2023-05-25 ENCOUNTER — Ambulatory Visit: Payer: HMO | Admitting: Family Medicine

## 2023-06-22 ENCOUNTER — Encounter: Admit: 2023-06-22

## 2023-06-22 DIAGNOSIS — Z96642 Presence of left artificial hip joint: Secondary | ICD-10-CM

## 2023-06-22 MED ORDER — AMOXICILLIN 500 MG PO CAPS
500 | ORAL_CAPSULE | Freq: Once | ORAL | 0 refills | Status: AC
Start: 2023-06-22 — End: 2023-06-22

## 2023-06-22 NOTE — Telephone Encounter (Signed)
 Patient is having some dental work done on 06/29/2023 and is requesting some antibiotics.    Patient had knee surgery 12/2022    [facility] [address], [place] - [address] [place] - [place] [phone] - F [phone]     Patient tel [phone]

## 2023-06-28 NOTE — Telephone Encounter (Signed)
06/28/23 patient can pick up medication at her pharmacy.

## 2023-07-02 ENCOUNTER — Ambulatory Visit: Payer: HMO | Admitting: Family Medicine

## 2023-07-02 ENCOUNTER — Encounter: Payer: Self-pay | Admitting: Family Medicine

## 2023-07-02 VITALS — BP 139/78 | HR 58 | Wt 145.0 lb

## 2023-07-02 DIAGNOSIS — Z780 Asymptomatic menopausal state: Secondary | ICD-10-CM

## 2023-07-02 DIAGNOSIS — N7011 Chronic salpingitis: Secondary | ICD-10-CM

## 2023-07-02 DIAGNOSIS — R300 Dysuria: Secondary | ICD-10-CM

## 2023-07-02 NOTE — Progress Notes (Signed)
Follow up: discuss U/S 09/06/22

## 2023-07-02 NOTE — Progress Notes (Signed)
   GYNECOLOGY  ENCOUNTER NOTE  Subjective:   Jacqueline Waters is a 68 y.o. G9P3003 female here for a routine annual gynecologic exam.    Current complaints: follow up US.     Patient had [place] on 08/16/22 that showed a possible hydrosalpinx but patient is s/p hysterectomy and salpingectomy. At that time she was undergoing a work up for hyperparathyroidism. Opted for watchful waiting. Reports no abdominal pain or adnexal pain or pressure. Was having palpitations but this have resolved.  Reports no bloating or vomiting. She has noticed a change in her urine but no odor or blood.   Since last being seen she has titrated off her estrogen which was our plan.   Denies abnormal vaginal bleeding, discharge, pelvic pain, problems with intercourse or other gynecologic concerns.   Well engaged with PCP.    Gynecologic History No LMP recorded. Patient has had a hysterectomy. Contraception: status post hysterectomy Last Pap: NA.  Last mammogram: 6/24. Results were: normal  Health Maintenance Due  Topic Date Due   Medicare Annual Wellness (AWV)  Never done   Pneumonia Vaccine 33+ Years old (1 of 2 - PCV) Never done   COVID-19 Vaccine (5 - 2024-25 season) 02/14/2023    The following portions of the patient's history were reviewed and updated as appropriate: allergies, current medications, past family history, past medical history, past social history, past surgical history and problem list.  Review of Systems Pertinent items are noted in HPI.   Objective:  BP 139/78   Pulse (!) 58   Wt 145 lb (65.8 kg)   BMI 26.52 kg/m  CONSTITUTIONAL: Well-developed, well-nourished female in no acute distress.  HENT:  Normocephalic, atraumatic, External right and left ear normal. Oropharynx is clear and moist EYES:  No scleral icterus.  NECK: Normal range of motion, supple, no masses.  Normal thyroid.  SKIN: Skin is warm and dry. No rash noted. Not diaphoretic. No erythema. No pallor. NEUROLOGIC: Alert and  oriented to person, place, and time. Normal reflexes, muscle tone coordination. No cranial nerve deficit noted. PSYCHIATRIC: Normal mood and affect. Normal behavior. Normal judgment and thought content. CARDIOVASCULAR: Normal heart rate noted, regular rhythm. 2+ distal pulses. RESPIRATORY: Effort and breath sounds normal, no problems with respiration noted. BREASTS: Symmetric in size. No masses, skin changes, nipple drainage, or lymphadenopathy. ABDOMEN: Soft,  no distention noted.  No tenderness, rebound or guarding.  PELVIC: Normal appearing external genitalia; Mildly atrophic vaginal mucosa and cervix. No abnormal discharge noted. Surgically absent. No other palpable masses or adnexal tenderness. Chaperone present for exam MUSCULOSKELETAL: Normal range of motion.    Assessment and Plan:  1. Dysuria (Primary) - Urine Culture  2. Hydrosalpinx Seen on [place] 08/16/22-- incidental finding Not felt on exam today Will get repeat US Discussed if still present will consider MRI to better evaluate the adnexa as patient still has her ovaries.  If present will consider labs as well - US PELVIC COMPLETE WITH TRANSVAGINAL; Future  3. Postmenopausal Not having sx and is currently off HRT for about 6+ months. No rebound sx which is great. We had started a taper at her last visit on 04/06/22  There are no diagnoses linked to this encounter.  Please refer to After Visit Summary for other counseling recommendations.   No follow-ups on file.  Federico Flake, MD, MPH, ABFM Attending Physician Center for [facility]

## 2023-07-05 LAB — URINE CULTURE: Organism ID, Bacteria: NO GROWTH

## 2023-07-06 ENCOUNTER — Encounter: Payer: Self-pay | Admitting: Family Medicine

## 2023-07-09 ENCOUNTER — Other Ambulatory Visit: Payer: HMO

## 2023-07-13 ENCOUNTER — Ambulatory Visit
Admission: RE | Admit: 2023-07-13 | Discharge: 2023-07-13 | Disposition: A | Discharge status: Home / Self Care | Payer: HMO | Source: Ambulatory Visit | Attending: Family Medicine | Admitting: Family Medicine

## 2023-07-13 DIAGNOSIS — N7011 Chronic salpingitis: Secondary | ICD-10-CM | POA: Insufficient documentation

## 2023-07-13 DIAGNOSIS — N838 Other noninflammatory disorders of ovary, fallopian tube and broad ligament: Secondary | ICD-10-CM | POA: Diagnosis not present

## 2023-07-14 ENCOUNTER — Other Ambulatory Visit: Payer: Self-pay | Admitting: Family Medicine

## 2023-07-14 ENCOUNTER — Encounter: Payer: Self-pay | Admitting: Family Medicine

## 2023-07-14 DIAGNOSIS — E559 Vitamin D deficiency, unspecified: Secondary | ICD-10-CM

## 2023-07-14 DIAGNOSIS — N1831 Chronic kidney disease, stage 3a: Secondary | ICD-10-CM

## 2023-07-14 DIAGNOSIS — E7800 Pure hypercholesterolemia, unspecified: Secondary | ICD-10-CM

## 2023-07-14 DIAGNOSIS — Z9889 Other specified postprocedural states: Secondary | ICD-10-CM

## 2023-07-14 NOTE — Addendum Note (Signed)
Addended by: Geanie Berlin on: 07/14/2023 04:39 PM   Modules accepted: Orders

## 2023-07-15 ENCOUNTER — Other Ambulatory Visit: Payer: HMO

## 2023-07-15 ENCOUNTER — Other Ambulatory Visit (INDEPENDENT_AMBULATORY_CARE_PROVIDER_SITE_OTHER): Payer: HMO

## 2023-07-15 DIAGNOSIS — N7011 Chronic salpingitis: Secondary | ICD-10-CM

## 2023-07-15 DIAGNOSIS — Z780 Asymptomatic menopausal state: Secondary | ICD-10-CM | POA: Diagnosis not present

## 2023-07-15 DIAGNOSIS — N1831 Chronic kidney disease, stage 3a: Secondary | ICD-10-CM | POA: Diagnosis not present

## 2023-07-15 DIAGNOSIS — E7800 Pure hypercholesterolemia, unspecified: Secondary | ICD-10-CM

## 2023-07-15 DIAGNOSIS — E559 Vitamin D deficiency, unspecified: Secondary | ICD-10-CM

## 2023-07-15 DIAGNOSIS — Z9089 Acquired absence of other organs: Secondary | ICD-10-CM

## 2023-07-15 DIAGNOSIS — Z9889 Other specified postprocedural states: Secondary | ICD-10-CM | POA: Diagnosis not present

## 2023-07-15 LAB — CBC WITH DIFFERENTIAL/PLATELET
Basophils Absolute: 0 10*3/uL (ref 0.0–0.1)
Basophils Relative: 0.8 % (ref 0.0–3.0)
Eosinophils Absolute: 0.1 10*3/uL (ref 0.0–0.7)
Eosinophils Relative: 2 % (ref 0.0–5.0)
HCT: 42.2 % (ref 36.0–46.0)
Hemoglobin: 14.2 g/dL (ref 12.0–15.0)
Lymphocytes Relative: 49.3 % — ABNORMAL HIGH (ref 12.0–46.0)
Lymphs Abs: 1.5 10*3/uL (ref 0.7–4.0)
MCHC: 33.6 g/dL (ref 30.0–36.0)
MCV: 89.5 fL (ref 78.0–100.0)
Monocytes Absolute: 0.4 10*3/uL (ref 0.1–1.0)
Monocytes Relative: 11.6 % (ref 3.0–12.0)
Neutro Abs: 1.1 10*3/uL — ABNORMAL LOW (ref 1.4–7.7)
Neutrophils Relative %: 36.3 % — ABNORMAL LOW (ref 43.0–77.0)
Platelets: 259 10*3/uL (ref 150.0–400.0)
RBC: 4.72 Mil/uL (ref 3.87–5.11)
RDW: 14.1 % (ref 11.5–15.5)
WBC: 3.1 10*3/uL — ABNORMAL LOW (ref 4.0–10.5)

## 2023-07-15 LAB — COMPREHENSIVE METABOLIC PANEL
ALT: 27 U/L (ref 0–35)
AST: 25 U/L (ref 0–37)
Albumin: 4.3 g/dL (ref 3.5–5.2)
Alkaline Phosphatase: 69 U/L (ref 39–117)
BUN: 15 mg/dL (ref 6–23)
CO2: 27 meq/L (ref 19–32)
Calcium: 9.6 mg/dL (ref 8.4–10.5)
Chloride: 105 meq/L (ref 96–112)
Creatinine, Ser: 0.94 mg/dL (ref 0.40–1.20)
GFR: 63.27 mL/min (ref >60.00)
Glucose, Bld: 92 mg/dL (ref 70–99)
Potassium: 4.2 meq/L (ref 3.5–5.1)
Sodium: 142 meq/L (ref 135–145)
Total Bilirubin: 0.5 mg/dL (ref 0.2–1.2)
Total Protein: 7.3 g/dL (ref 6.0–8.3)

## 2023-07-15 LAB — PHOSPHORUS: Phosphorus: 3.6 mg/dL (ref 2.3–4.6)

## 2023-07-15 LAB — MICROALBUMIN / CREATININE URINE RATIO
Creatinine,U: 175.7 mg/dL
Microalb Creat Ratio: 0.4 mg/g (ref 0.0–30.0)
Microalb, Ur: 0.8 mg/dL (ref 0.0–1.9)

## 2023-07-15 LAB — LIPID PANEL
Cholesterol: 171 mg/dL (ref 0–200)
HDL: 49.3 mg/dL (ref >39.00)
LDL Cholesterol: 110 mg/dL — ABNORMAL HIGH (ref 0–99)
NonHDL: 121.24
Total CHOL/HDL Ratio: 3
Triglycerides: 56 mg/dL (ref 0.0–149.0)
VLDL: 11.2 mg/dL (ref 0.0–40.0)

## 2023-07-15 LAB — VITAMIN D 25 HYDROXY (VIT D DEFICIENCY, FRACTURES): VITD: 36.89 ng/mL (ref 30.00–100.00)

## 2023-07-16 LAB — OVARIAN MALIGNANCY RISK-ROMA
Cancer Antigen (CA) 125: 7 U/mL (ref 0.0–38.1)
HE4: 62.7 pmol/L (ref 0.0–96.5)
Postmenopausal ROMA: 0.86
Premenopausal ROMA: 1.16 — ABNORMAL HIGH

## 2023-07-16 LAB — PREMENOPAUSAL INTERP: HIGH

## 2023-07-16 LAB — POSTMENOPAUSAL INTERP: LOW

## 2023-07-16 LAB — PARATHYROID HORMONE, INTACT (NO CA): PTH: 21 pg/mL (ref 16–77)

## 2023-07-17 ENCOUNTER — Encounter

## 2023-07-19 MED ORDER — MELOXICAM 15 MG PO TABS
15 | ORAL_TABLET | Freq: Every day | ORAL | 1 refills | Status: DC
Start: 2023-07-19 — End: 2024-02-07

## 2023-07-21 ENCOUNTER — Encounter: Payer: Self-pay | Admitting: Family Medicine

## 2023-07-21 ENCOUNTER — Ambulatory Visit: Payer: HMO | Admitting: Family Medicine

## 2023-07-21 VITALS — BP 152/80 | HR 55 | Temp 97.8°F | Ht 61.75 in | Wt 147.0 lb

## 2023-07-21 DIAGNOSIS — R052 Subacute cough: Secondary | ICD-10-CM | POA: Diagnosis not present

## 2023-07-21 DIAGNOSIS — N7011 Chronic salpingitis: Secondary | ICD-10-CM

## 2023-07-21 DIAGNOSIS — E8940 Asymptomatic postprocedural ovarian failure: Secondary | ICD-10-CM

## 2023-07-21 DIAGNOSIS — I10 Essential (primary) hypertension: Secondary | ICD-10-CM | POA: Diagnosis not present

## 2023-07-21 DIAGNOSIS — E210 Primary hyperparathyroidism: Secondary | ICD-10-CM

## 2023-07-21 DIAGNOSIS — Z0000 Encounter for general adult medical examination without abnormal findings: Secondary | ICD-10-CM

## 2023-07-21 DIAGNOSIS — Z7189 Other specified counseling: Secondary | ICD-10-CM | POA: Insufficient documentation

## 2023-07-21 DIAGNOSIS — N393 Stress incontinence (female) (male): Secondary | ICD-10-CM

## 2023-07-21 DIAGNOSIS — E7800 Pure hypercholesterolemia, unspecified: Secondary | ICD-10-CM

## 2023-07-21 DIAGNOSIS — E559 Vitamin D deficiency, unspecified: Secondary | ICD-10-CM | POA: Diagnosis not present

## 2023-07-21 DIAGNOSIS — N182 Chronic kidney disease, stage 2 (mild): Secondary | ICD-10-CM | POA: Diagnosis not present

## 2023-07-21 DIAGNOSIS — R002 Palpitations: Secondary | ICD-10-CM | POA: Diagnosis not present

## 2023-07-21 DIAGNOSIS — Z9889 Other specified postprocedural states: Secondary | ICD-10-CM | POA: Diagnosis not present

## 2023-07-21 DIAGNOSIS — Z8249 Family history of ischemic heart disease and other diseases of the circulatory system: Secondary | ICD-10-CM

## 2023-07-21 DIAGNOSIS — Z9089 Acquired absence of other organs: Secondary | ICD-10-CM

## 2023-07-21 DIAGNOSIS — H903 Sensorineural hearing loss, bilateral: Secondary | ICD-10-CM | POA: Diagnosis not present

## 2023-07-21 DIAGNOSIS — Z23 Encounter for immunization: Secondary | ICD-10-CM

## 2023-07-21 NOTE — Patient Instructions (Addendum)
 Prevnar-20 today (pneumonia shot).  Let me know if persistent dry cough not improving over next few weeks - most likely post-viral cough but we need to consider benazepril -induced cough possibility. Let me know when interested in formal hearing evaluation for audiology referral.  Bring us  a copy of your living will to update your chart.  Try kegel exercises for possible stress incontinence symptoms.  You are doing well today  Return as needed or in 1 year for next wellness visit.  Return in 3 months for blood pressure follow up.  Keep BP log at home - sheet provided today.  Bring in home cuff to compare at next visit

## 2023-07-21 NOTE — Progress Notes (Addendum)
 Ph: [phone] Fax: [phone]   Patient ID: Jacqueline Waters, female    DOB: [birth year], 68 y.o.   MRN: [id number]  This visit was conducted in person.  BP (!) 152/80   Pulse (!) 55   Temp 97.8 F (36.6 C) (Oral)   Ht 5' 1.75 (1.568 m)   Wt 147 lb (66.7 kg)   SpO2 98%   BMI 27.10 kg/m   BP Readings from Last 3 Encounters:  07/21/23 (!) 152/80  07/02/23 139/78  04/06/23 136/64  Elevated on repeat 160s/90s   CC: welcome to medicare visit  Subjective:   HPI: LYNCOLN Waters is a 68 y.o. female presenting on 07/21/2023 for Welcome to Medicare Exam (Wants to discuss multivitamin. )   Hearing Screening   500Hz  1000Hz  2000Hz  4000Hz   Right ear 0 0 40 40  Left ear 0 0 20 40   Vision Screening   Right eye Left eye Both eyes  Without correction     With correction 20/30 20/25 20/25   She notes more trouble with hearing for months and husband has also commented - has to raise TV volume  Flowsheet Row Office Visit from 07/21/2023 in [facility]  PHQ-2 Total Score 0         07/21/2023    3:24 PM 04/06/2023   12:33 PM 01/18/2023   10:47 AM 07/15/2022    2:49 PM  Fall Risk   Falls in the past year? 0 0 0 0   Got medicare B 03/2023   Residual persistent dry cough for the past 3 weeks since cold 05/2023. Discussed monitoring for possible ACEI-induced cough  S/p minimally invasive parathyroidectomy 11/2022 for primary hyperparathyroidism - first ultrasound and nuclear sestamibi parathyroid  scan followed by 4D parathyroid  neck CT showing 2 areas of L thyroid  parenchymal enhancement  - pathology showed parathyroid  adenoma. She noticed significant improvement in overall wellbeing a few days after surgery (bone pain, skin complexion, hair regrowth, energy).   Hydrosalpinx - incidentally noted, rpt pelvic us  was stable. Followed by GYN, s/p  ROMA panel to evaluate further ovarian pathology - seems overall reassuring results.    Palpitations s/p Zio  patch 04/2023: HR 39 - 141, average 50. 1 nonsustained SVT, longest 7 beats. Rare supraventricular and ventricular ectopy. No sustained arrhythmias. No atrial fibrillation. Palpitations have largely resolved.   Coronary calcium  score (04/2023): 46.1 (71% for age/gender) - continues atorvastatin .   Preventative: COLONOSCOPY Date: 2010 WNL Priscilla) COLONOSCOPY 03/2019 - TA, HP, diverticulosis rpt 3 yrs ([facility])  Colonoscopy 03/2022 - diverticulosis, no polyps, rpt 7 yrs ([facility]) Well woman with OBGYN Dr Eldonna last seen 06/2023 - s/p hysterectomy. HRT stopped mid 2024.  [facility] - not eligible  Flu shot yearly  COVID vaccine - Moderna 08/2019, 09/2019, Moderna booster 04/2020, Pfizer 12/2020   Prevnar-20 today Tdap 2014, 01/2023 Shingrix - 12/2019, 04/2020 Advanced directive - has this at home. Will bring us  copy. HCPOA is husband Joe.  Seat belt use discussed  Sunscreen use discussed. 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 Interim medical history since our last visit reviewed. Allergies and medications reviewed and updated. Outpatient Medications Prior to Visit  Medication Sig Dispense Refill   acetaminophen  (TYLENOL ) 500 MG tablet Take 1 tablet (500 mg total) by mouth daily as needed. 30 tablet 1   Cholecalciferol (VITAMIN D3) 25 MCG (1000 UT) CAPS Take 1,000 Units by mouth daily.     diclofenac  Sodium (VOLTAREN ) 1 % GEL Apply 2 g topically 4 (four) times  daily. (Patient taking differently: Apply 2 g topically daily as needed (pain).) 100 g 2   hydrOXYzine  (ATARAX ) 25 MG tablet Take 1/2-1 tablet (12.5-25 mg total) by mouth 2 (two) times daily as needed for anxiety. 30 tablet 3   methocarbamol  (ROBAXIN ) 500 MG tablet Take 1 tablet (500 mg total) by mouth at bedtime. 30 tablet 1   Multiple Vitamins-Minerals (HAIR SKIN & NAILS PO) Take 1 tablet by mouth daily.     atorvastatin  (LIPITOR) 20 MG tablet Take 1 tablet (20 mg total) by mouth every morning. 90 tablet 4   benazepril  (LOTENSIN ) 20 MG tablet Take 1 tablet (20 mg total) by mouth daily. 90 tablet 4   benzonatate  (TESSALON ) 200 MG capsule Take 1 capsule (200 mg total) by mouth 2 (two) times daily as needed for cough. 20 capsule 0   bisoprolol  (ZEBETA ) 5 MG tablet Take 1 tablet (5 mg total) by mouth daily. 90 tablet 4   spironolactone  (ALDACTONE ) 25 MG tablet Take 1 tablet (25 mg total) by mouth daily. 90 tablet 4   No facility-administered medications prior to visit.     Per HPI unless specifically indicated in ROS section below Review of Systems  Objective:  BP (!) 152/80   Pulse (!) 55   Temp 97.8 F (36.6 C) (Oral)   Ht 5' 1.75 (1.568 m)   Wt 147 lb (66.7 kg)   SpO2 98%   BMI 27.10 kg/m   Wt Readings from Last 3 Encounters:  07/21/23 147 lb (66.7 kg)  07/02/23 145 lb (65.8 kg)  04/06/23 142 lb (64.4 kg)      Physical Exam Vitals and nursing note reviewed.  Constitutional:      Appearance: Normal appearance. She is not ill-appearing.  HENT:     Head: Normocephalic and atraumatic.     Right Ear: Tympanic membrane, ear canal and external ear normal. There is no impacted cerumen.     Left Ear: Tympanic membrane, ear canal and external ear normal. There is no impacted cerumen.     Mouth/Throat:     Mouth: Mucous membranes are moist.     Pharynx: Oropharynx is clear. No oropharyngeal exudate or posterior oropharyngeal erythema.  Eyes:     General:        Right eye: No  discharge.        Left eye: No discharge.     Extraocular Movements: Extraocular movements intact.     Conjunctiva/sclera: Conjunctivae normal.     Pupils: Pupils are equal, round, and reactive to light.  Neck:     Thyroid : No thyroid  mass or thyromegaly.     Vascular: No carotid bruit.  Cardiovascular:     Rate and Rhythm: Normal rate and regular rhythm.     Pulses: Normal pulses.     Heart sounds: Normal heart sounds. No murmur heard. Pulmonary:     Effort: Pulmonary effort is normal. No  respiratory distress.     Breath sounds: Normal breath sounds. No wheezing, rhonchi or rales.  Abdominal:     General: Bowel sounds are normal. There is no distension.     Palpations: Abdomen is soft. There is no mass.     Tenderness: There is no abdominal tenderness. There is no guarding or rebound.     Hernia: No hernia is present.  Musculoskeletal:     Cervical back: Normal range of motion and neck supple. No rigidity.     Right lower leg: No edema.     Left lower leg: No edema.  Lymphadenopathy:     Cervical: No cervical adenopathy.  Skin:    General: Skin is warm and dry.     Findings: No rash.  Neurological:     General: No focal deficit present.     Mental Status: She is alert. Mental status is at baseline.     Comments:  Recall 3/3 Calculation 5/5 DLROW  Psychiatric:        Mood and Affect: Mood normal.        Behavior: Behavior normal.       Results for orders placed or performed in visit on 07/15/23  Ovarian Malignancy Risk-ROMA   Collection Time: 07/15/23  9:46 AM  Result Value Ref Range   Cancer Antigen (CA) 125 7.0 0.0 - 38.1 U/mL   HE4 62.7 0.0 - 96.5 pmol/L   Premenopausal ROMA 1.16 (H) See below   Postmenopausal ROMA 0.86 See below   Comment Comment   Premenopausal Interp: HIGH   Collection Time: 07/15/23  9:46 AM  Result Value Ref Range   Premenopausal Interp: HIGH Comment   Postmenopausal Interp: LOW   Collection Time: 07/15/23  9:46 AM  Result Value Ref  Range   Postmenopausal Interp: LOW Comment    Lab Results  Component Value Date   CHOL 171 07/15/2023   HDL 49.30 07/15/2023   LDLCALC 110 (H) 07/15/2023   LDLDIRECT 165.6 07/25/2013   TRIG 56.0 07/15/2023   CHOLHDL 3 07/15/2023    Lab Results  Component Value Date   NA 142 07/15/2023   CL 105 07/15/2023   K 4.2 07/15/2023   CO2 27 07/15/2023   BUN 15 07/15/2023   CREATININE 0.94 07/15/2023   GFR 63.27 07/15/2023   CALCIUM  9.6 07/15/2023   PHOS 3.6 07/15/2023   ALBUMIN 4.3 07/15/2023   GLUCOSE 92 07/15/2023   Lab Results  Component Value Date   VD25OH 36.89 07/15/2023   Lab Results  Component Value Date   WBC 3.1 (L) 07/15/2023   HGB 14.2 07/15/2023   HCT 42.2 07/15/2023   MCV 89.5 07/15/2023   PLT 259.0 07/15/2023    Lab Results  Component Value Date   PTH 21 07/15/2023   CALCIUM  9.6 07/15/2023   PHOS 3.6 07/15/2023    Assessment & Plan:   Problem List Items Addressed This Visit     Advanced directives, counseling/discussion (Chronic)   Advanced directive - has this at home. Will bring us  copy. HCPOA is husband Joe.       Welcome to Medicare preventive visit - Primary (Chronic)   I have personally reviewed the Medicare Annual Wellness questionnaire and have noted 1. The patient's medical and social history 2. Their use of alcohol, tobacco or illicit drugs 3. Their current medications and supplements 4. The patient's functional ability including ADL's, fall risks, home safety risks and hearing or visual impairment. Cognitive function has been assessed and addressed as  indicated.  5. Diet and physical activity 6. Evidence for depression or mood disorders The patients weight, height, BMI have been recorded in the chart. I have made referrals, counseling and provided education to the patient based on review of the above and I have provided the pt with a written personalized care plan for preventive services. Provider list updated.. See scanned questionairre  as needed for further documentation. Reviewed preventative protocols and updated unless pt declined.  EKG not updated as recently done.       Vitamin D  deficiency   Chronic, levels stable on 1000 international units daily replacement - continue       HYPERCHOLESTEROLEMIA   Chronic, stable period on atorva 20mg  daily - continue.  The 10-year ASCVD risk score (Arnett DK, et al., 2019) is: 12.5%   Values used to calculate the score:     Age: 56 years     Sex: Female     Is Non-Hispanic African American: Yes     Diabetic: No     Tobacco smoker: No     Systolic Blood Pressure: 152 mmHg     Is BP treated: Yes     HDL Cholesterol: 49.3 mg/dL     Total Cholesterol: 171 mg/dL       Relevant Medications   atorvastatin  (LIPITOR) 20 MG tablet   benazepril  (LOTENSIN ) 20 MG tablet   bisoprolol  (ZEBETA ) 5 MG tablet   spironolactone  (ALDACTONE ) 25 MG tablet   Essential hypertension   Chronic, deteriorated despite 3 drug regimen (spironolactone , bisoprolol , benazepril ).  She notes home BP readings run better controlled. BP log sheet provided to check at home and jot down readings then she will notify us  if persistently high at home to titrate antihypertensives accordingly. Will use home BP readings for medication adjustment.  Amlodipine  previously stopped per breast surgeon recs prior to surgery. Consider restarting amlodipine  pending above.       Relevant Medications   atorvastatin  (LIPITOR) 20 MG tablet   benazepril  (LOTENSIN ) 20 MG tablet   bisoprolol  (ZEBETA ) 5 MG tablet   spironolactone  (ALDACTONE ) 25 MG tablet   Cough   Anticipate post-viral cough.  However did discuss possible ACEI -induced cough.  She will let me know if persists past another 1-2 wks.       Surgical menopause   HRT stopped mid 2024      CKD (chronic kidney disease) stage 2, GFR 60-89 ml/min   Reviewed improving GFR - will change diagnosis to CKD stage 2.  Microalb normal.  Encourage continued working  towards tight BP control with goal BP <135/85      Primary hyperparathyroidism (HCC)   S/p parathyroidectomy 11/2022 ([facility]).       Hydrosalpinx   Chronic, incidentally noted.  Recent overall reassuring pelvic US .  GYN following.       S/P parathyroidectomy   Family history of premature CAD   CAC 04/2023 - 46.1 (71% age) - continue atorvastatin   Update Lp(a) next labwork.       Palpitations   S/p reassuring Zio patch heart monitor 04/2023 Palpitations are largely resolved.       Stress incontinence in female   Overall mild. Discussed Kegel exercises      Bilateral hearing loss   Anticipate sensorineural given symmetric, progressive not sudden onset. Offered audiology assessment for formal hearing evalaution - she will let me know when ready.       Other Visit Diagnoses       Encounter for immunization  Relevant Orders   Pneumococcal conjugate vaccine 20-valent (Completed)        Meds ordered this encounter  Medications   atorvastatin  (LIPITOR) 20 MG tablet    Sig: Take 1 tablet (20 mg total) by mouth every morning.    Dispense:  90 tablet    Refill:  4   benazepril  (LOTENSIN ) 20 MG tablet    Sig: Take 1 tablet (20 mg total) by mouth daily.    Dispense:  90 tablet    Refill:  4   bisoprolol  (ZEBETA ) 5 MG tablet    Sig: Take 1 tablet (5 mg total) by mouth daily.    Dispense:  90 tablet    Refill:  4   spironolactone  (ALDACTONE ) 25 MG tablet    Sig: Take 1 tablet (25 mg total) by mouth daily.    Dispense:  90 tablet    Refill:  4    Orders Placed This Encounter  Procedures   Pneumococcal conjugate vaccine 20-valent    Patient Instructions  Prevnar-20 today (pneumonia shot).  Let me know if persistent dry cough not improving over next few weeks - most likely post-viral cough but we need to consider benazepril -induced cough possibility. Let me know when interested in formal hearing evaluation for audiology referral.  Bring us  a copy of your  living will to update your chart.  Try kegel exercises for possible stress incontinence symptoms.  You are doing well today  Return as needed or in 1 year for next wellness visit.  Return in 3 months for blood pressure follow up.  Keep BP log at home - sheet provided today.  Bring in home cuff to compare at next visit   Follow up plan: Return in about 3 months (around 10/18/2023) for follow up visit.  Anton Blas, MD

## 2023-07-21 NOTE — Assessment & Plan Note (Signed)
 Advanced directive - has this at home. Will bring us  copy. HCPOA is husband Joe.

## 2023-07-21 NOTE — Assessment & Plan Note (Addendum)
 I have personally reviewed the Medicare Annual Wellness questionnaire and have noted 1. The patient's medical and social history 2. Their use of alcohol, tobacco or illicit drugs 3. Their current medications and supplements 4. The patient's functional ability including ADL's, fall risks, home safety risks and hearing or visual impairment. Cognitive function has been assessed and addressed as indicated.  5. Diet and physical activity 6. Evidence for depression or mood disorders The patients weight, height, BMI have been recorded in the chart. I have made referrals, counseling and provided education to the patient based on review of the above and I have provided the pt with a written personalized care plan for preventive services. Provider list updated.. See scanned questionairre as needed for further documentation. Reviewed preventative protocols and updated unless pt declined.  EKG not updated as recently done.

## 2023-07-22 ENCOUNTER — Other Ambulatory Visit (HOSPITAL_COMMUNITY): Payer: Self-pay

## 2023-07-22 DIAGNOSIS — H9193 Unspecified hearing loss, bilateral: Secondary | ICD-10-CM | POA: Insufficient documentation

## 2023-07-22 DIAGNOSIS — N393 Stress incontinence (female) (male): Secondary | ICD-10-CM | POA: Insufficient documentation

## 2023-07-22 MED ORDER — SPIRONOLACTONE 25 MG PO TABS
25.0000 mg | ORAL_TABLET | Freq: Every day | ORAL | 4 refills | Status: AC
  Filled 2023-07-22 – 2023-10-14 (×2): qty 90, 90d supply, fill #0
  Filled 2024-01-20: qty 90, 90d supply, fill #1
  Filled 2024-04-18: qty 90, 90d supply, fill #2

## 2023-07-22 MED ORDER — BENAZEPRIL HCL 20 MG PO TABS
20.0000 mg | ORAL_TABLET | Freq: Every day | ORAL | 4 refills | Status: DC
  Filled 2023-07-22 – 2023-08-10 (×2): qty 90, 90d supply, fill #0

## 2023-07-22 MED ORDER — ATORVASTATIN CALCIUM 20 MG PO TABS
20.0000 mg | ORAL_TABLET | Freq: Every morning | ORAL | 4 refills | Status: AC
  Filled 2023-07-22 – 2023-09-14 (×2): qty 90, 90d supply, fill #0
  Filled 2023-12-15: qty 90, 90d supply, fill #1
  Filled 2024-03-14: qty 90, 90d supply, fill #2
  Filled 2024-07-14: qty 90, 90d supply, fill #3

## 2023-07-22 MED ORDER — BISOPROLOL FUMARATE 5 MG PO TABS
5.0000 mg | ORAL_TABLET | Freq: Every day | ORAL | 4 refills | Status: AC
  Filled 2023-07-22 – 2023-09-14 (×2): qty 90, 90d supply, fill #0
  Filled 2023-12-15: qty 90, 90d supply, fill #1
  Filled 2024-03-14: qty 90, 90d supply, fill #2
  Filled 2024-07-14: qty 90, 90d supply, fill #3

## 2023-07-22 NOTE — Assessment & Plan Note (Signed)
 Overall mild. Discussed Kegel exercises

## 2023-07-22 NOTE — Assessment & Plan Note (Signed)
 CAC 04/2023 - 46.1 (71% age) - continue atorvastatin   Update Lp(a) next labwork.

## 2023-07-22 NOTE — Assessment & Plan Note (Signed)
 Chronic, levels stable on 1000 international units daily replacement - continue

## 2023-07-22 NOTE — Assessment & Plan Note (Signed)
 Chronic, incidentally noted.  Recent overall reassuring pelvic US .  GYN following.

## 2023-07-22 NOTE — Assessment & Plan Note (Signed)
 S/p reassuring Zio patch heart monitor 04/2023 Palpitations are largely resolved.

## 2023-07-22 NOTE — Assessment & Plan Note (Addendum)
 Reviewed improving GFR - will change diagnosis to CKD stage 2.  Microalb normal.  Encourage continued working towards tight BP control with goal BP <135/85

## 2023-07-22 NOTE — Addendum Note (Signed)
 Addended by: Claire Crick on: 07/22/2023 10:17 AM   Modules accepted: Orders

## 2023-07-22 NOTE — Assessment & Plan Note (Addendum)
 Chronic, stable period on atorva 20mg  daily - continue.  The 10-year ASCVD risk score (Arnett DK, et al., 2019) is: 12.5%   Values used to calculate the score:     Age: 68 years     Sex: Female     Is Non-Hispanic African American: Yes     Diabetic: No     Tobacco smoker: No     Systolic Blood Pressure: 152 mmHg     Is BP treated: Yes     HDL Cholesterol: 49.3 mg/dL     Total Cholesterol: 171 mg/dL

## 2023-07-22 NOTE — Assessment & Plan Note (Signed)
 HRT stopped mid 2024

## 2023-07-22 NOTE — Assessment & Plan Note (Signed)
 S/p parathyroidectomy 11/2022 ([facility]).

## 2023-07-22 NOTE — Assessment & Plan Note (Addendum)
 Chronic, deteriorated despite 3 drug regimen (spironolactone , bisoprolol , benazepril ).  She notes home BP readings run better controlled. BP log sheet provided to check at home and jot down readings then she will notify us  if persistently high at home to titrate antihypertensives accordingly. Will use home BP readings for medication adjustment.  Amlodipine  previously stopped per breast surgeon recs prior to surgery. Consider restarting amlodipine  pending above.

## 2023-07-22 NOTE — Assessment & Plan Note (Addendum)
 Anticipate post-viral cough.  However did discuss possible ACEI -induced cough.  She will let me know if persists past another 1-2 wks.

## 2023-07-22 NOTE — Assessment & Plan Note (Signed)
 Anticipate sensorineural given symmetric, progressive not sudden onset. Offered audiology assessment for formal hearing evalaution - she will let me know when ready.

## 2023-07-27 ENCOUNTER — Encounter: Payer: Self-pay | Admitting: Family Medicine

## 2023-08-02 ENCOUNTER — Encounter: Payer: Self-pay | Admitting: Family Medicine

## 2023-08-02 DIAGNOSIS — N951 Menopausal and female climacteric states: Secondary | ICD-10-CM

## 2023-08-06 ENCOUNTER — Other Ambulatory Visit (HOSPITAL_COMMUNITY): Payer: Self-pay

## 2023-08-06 MED ORDER — GABAPENTIN 600 MG PO TABS
600.0000 mg | ORAL_TABLET | Freq: Every day | ORAL | 3 refills | Status: DC
  Filled 2023-08-06: qty 30, 30d supply, fill #0
  Filled 2023-08-31: qty 30, 30d supply, fill #1
  Filled 2023-09-30: qty 30, 30d supply, fill #2
  Filled 2023-11-04: qty 30, 30d supply, fill #3

## 2023-08-10 ENCOUNTER — Other Ambulatory Visit (HOSPITAL_COMMUNITY): Payer: Self-pay

## 2023-08-10 ENCOUNTER — Other Ambulatory Visit: Payer: Self-pay

## 2023-08-17 ENCOUNTER — Encounter (INDEPENDENT_AMBULATORY_CARE_PROVIDER_SITE_OTHER): Payer: Self-pay | Admitting: Family Medicine

## 2023-08-19 ENCOUNTER — Other Ambulatory Visit (HOSPITAL_COMMUNITY): Payer: Self-pay

## 2023-08-19 DIAGNOSIS — R059 Cough, unspecified: Secondary | ICD-10-CM

## 2023-08-19 MED ORDER — LOSARTAN POTASSIUM 50 MG PO TABS
50.0000 mg | ORAL_TABLET | Freq: Every day | ORAL | 3 refills | Status: DC
  Filled 2023-08-19: qty 90, 90d supply, fill #0

## 2023-08-19 NOTE — Telephone Encounter (Signed)
Please see the MyChart message reply(ies) for my assessment and plan.  The patient gave consent for this Medical Advice Message and is aware that it may result in a bill to their insurance company as well as the possibility that this may result in a co-payment or deductible. They are an established patient, but are not seeking medical advice exclusively about a problem treated during an in person or video visit in the last 7 days. I did not recommend an in person or video visit within 7 days of my reply.  I spent a total of 7 minutes cumulative time within 7 days through MyChart messaging Eustaquio Boyden, MD

## 2023-08-31 ENCOUNTER — Other Ambulatory Visit (HOSPITAL_COMMUNITY): Payer: Self-pay

## 2023-09-15 ENCOUNTER — Other Ambulatory Visit (HOSPITAL_COMMUNITY): Payer: Self-pay

## 2023-10-05 ENCOUNTER — Encounter: Payer: Self-pay | Admitting: Family Medicine

## 2023-10-11 ENCOUNTER — Telehealth: Payer: Self-pay

## 2023-10-11 NOTE — Telephone Encounter (Signed)
 Left message for pt to call office back regarding message left on office vm.

## 2023-10-14 ENCOUNTER — Other Ambulatory Visit (HOSPITAL_COMMUNITY): Payer: Self-pay

## 2023-10-18 ENCOUNTER — Other Ambulatory Visit (HOSPITAL_COMMUNITY): Payer: Self-pay

## 2023-10-18 ENCOUNTER — Encounter: Payer: Self-pay | Admitting: Family Medicine

## 2023-10-18 ENCOUNTER — Ambulatory Visit (INDEPENDENT_AMBULATORY_CARE_PROVIDER_SITE_OTHER): Payer: HMO | Admitting: Family Medicine

## 2023-10-18 VITALS — BP 166/80 | HR 90 | Temp 97.8°F | Ht 61.75 in | Wt 144.0 lb

## 2023-10-18 DIAGNOSIS — E8941 Symptomatic postprocedural ovarian failure: Secondary | ICD-10-CM | POA: Diagnosis not present

## 2023-10-18 DIAGNOSIS — I10 Essential (primary) hypertension: Secondary | ICD-10-CM | POA: Diagnosis not present

## 2023-10-18 MED ORDER — LOSARTAN POTASSIUM 100 MG PO TABS
100.0000 mg | ORAL_TABLET | Freq: Every day | ORAL | 2 refills | Status: DC
  Filled 2023-10-18: qty 90, 90d supply, fill #0
  Filled 2024-01-20: qty 90, 90d supply, fill #1
  Filled 2024-04-18: qty 90, 90d supply, fill #2

## 2023-10-18 NOTE — Patient Instructions (Addendum)
 BP was too high in office - continue monitoring at home.  Increase losartan  to 100mg  in the morning.  Continue bisoprolol  and spironolactone .  Schedule lab visit in 1 week to recheck kidney function on new regimen.  Return in 3 months for hypertension follow up visit

## 2023-10-18 NOTE — Assessment & Plan Note (Signed)
 Chronic, deteriorated control since switch from benazepril  20mg  to losartan  50mg  - will increase to full 100mg  losartan  tablets.  Home and our office cuff have comparable readings today.  Continue monitoring at home - new BP log sheet provided to keep track.  RTC 3 mo HTN f/u visit  Continue bisoprolol  5mg  and spironolactone  25mg  daily. Takes all meds in am.

## 2023-10-18 NOTE — Assessment & Plan Note (Addendum)
 Appreciate gyn care now on gabapentin   300mg  bid

## 2023-10-18 NOTE — Addendum Note (Signed)
 Addended by: Bonney Berres on: 10/18/2023 09:25 AM   Modules accepted: Orders

## 2023-10-18 NOTE — Progress Notes (Signed)
 Ph: [phone] Fax: [phone]   Patient ID: Jacqueline Waters, female    DOB: [birth year], 68 y.o.   MRN: [id number]  This visit was conducted in person.  BP (!) 166/80 (BP Location: Right Arm, Cuff Size: Normal)   Pulse 90   Temp 97.8 F (36.6 C) (Oral)   Ht 5' 1.75" (1.568 m)   Wt 144 lb (65.3 kg)   SpO2 99%   BMI 26.55 kg/m   BP Readings from Last 3 Encounters:  10/18/23 (!) 166/80  07/21/23 (!) 152/80  07/02/23 139/78  With home cuff: 160/89, HR 53  CC: HTN f/u visit  Subjective:   HPI: Jacqueline Waters is a 68 y.o. female presenting on 10/18/2023 for Medication Management (F/U on HTN; doing pretty good, no problems with medication; no concerns)   Hot flashes - on gabapentin  600mg  1/2 tab BID. She had questions about gabapentin  dosing side effects and effect on kidneys  HTN - Compliant with current antihypertensive regimen of bisoprolol  5mg  daily, losartan  50mg  daily, spironolactone  25mg  daily. Benazepril  may have caused dry cough. Does check blood pressures at home: and brings log - 13014-s/80s, am 110/73, HR 40-50s . No low blood pressure readings or symptoms of dizziness/syncope.  Denies HA, vision changes, CP/tightness, SOB, leg swelling.         Relevant past medical, surgical, family and social history reviewed and updated as indicated. Interim medical history since our last visit reviewed. Allergies and medications reviewed and updated. Outpatient Medications Prior to Visit  Medication Sig Dispense Refill   acetaminophen  (TYLENOL ) 500 MG tablet Take 1 tablet (500 mg total) by mouth daily as needed. 30 tablet 1   atorvastatin  (LIPITOR) 20 MG tablet Take 1 tablet (20 mg total) by mouth every morning. 90 tablet 4   bisoprolol  (ZEBETA ) 5 MG tablet Take 1 tablet (5 mg total) by mouth daily. 90 tablet 4   Cholecalciferol (VITAMIN D3) 25 MCG (1000 UT) CAPS Take 1,000 Units by mouth daily.     Coenzyme Q10 (COQ10) 100 MG CAPS Take 1 capsule by mouth daily.      diclofenac  Sodium (VOLTAREN ) 1 % GEL Apply 2 g topically 4 (four) times daily. (Patient taking differently: Apply 2 g topically daily as needed (pain).) 100 g 2   gabapentin  (NEURONTIN ) 600 MG tablet Take 1 tablet (600 mg total) by mouth at bedtime. Start with 1/2 tab and increase to 1 tablet as needed 30 tablet 3   gelatin absorbable (SURGIFOAM) 50 sponge Apply 1 each topically once.     hydrOXYzine  (ATARAX ) 25 MG tablet Take 1/2-1 tablet (12.5-25 mg total) by mouth 2 (two) times daily as needed for anxiety. 30 tablet 3   methocarbamol  (ROBAXIN ) 500 MG tablet Take 1 tablet (500 mg total) by mouth at bedtime. 30 tablet 1   MISC NATURAL PRODUCTS PO Take by mouth.     spironolactone  (ALDACTONE ) 25 MG tablet Take 1 tablet (25 mg total) by mouth daily. 90 tablet 4   Coenzyme Q10 (COQ10 PO) Take by mouth.     losartan  (COZAAR ) 50 MG tablet Take 1 tablet (50 mg total) by mouth daily. 90 tablet 3   Multiple Vitamins-Minerals (HAIR SKIN & NAILS PO) Take 1 tablet by mouth daily.     No facility-administered medications prior to visit.     Per HPI unless specifically indicated in ROS section below Review of Systems  Objective:  BP (!) 166/80 (BP Location: Right Arm, Cuff Size: Normal)   Pulse 90  Temp 97.8 F (36.6 C) (Oral)   Ht 5' 1.75" (1.568 m)   Wt 144 lb (65.3 kg)   SpO2 99%   BMI 26.55 kg/m   Wt Readings from Last 3 Encounters:  10/18/23 144 lb (65.3 kg)  07/21/23 147 lb (66.7 kg)  07/02/23 145 lb (65.8 kg)      Physical Exam Vitals and nursing note reviewed.  Constitutional:      Appearance: Normal appearance. She is not ill-appearing.  HENT:     Head: Normocephalic and atraumatic.     Mouth/Throat:     Mouth: Mucous membranes are moist.     Pharynx: Oropharynx is clear. No oropharyngeal exudate or posterior oropharyngeal erythema.  Eyes:     Conjunctiva/sclera: Conjunctivae normal.     Pupils: Pupils are equal, round, and reactive to light.  Neck:     Thyroid : No thyroid   mass or thyromegaly.  Cardiovascular:     Rate and Rhythm: Normal rate and regular rhythm.     Pulses: Normal pulses.     Heart sounds: Normal heart sounds. No murmur heard. Pulmonary:     Effort: Pulmonary effort is normal. No respiratory distress.     Breath sounds: Normal breath sounds. No wheezing, rhonchi or rales.  Musculoskeletal:     Cervical back: Normal range of motion and neck supple.     Right lower leg: No edema.     Left lower leg: No edema.  Skin:    General: Skin is warm and dry.     Findings: No rash.  Neurological:     Mental Status: She is alert.  Psychiatric:        Mood and Affect: Mood normal.        Behavior: Behavior normal.       Results for orders placed or performed in visit on 07/15/23  Ovarian Malignancy Risk-ROMA   Collection Time: 07/15/23  9:46 AM  Result Value Ref Range   Cancer Antigen (CA) 125 7.0 0.0 - 38.1 U/mL   HE4 62.7 0.0 - 96.5 pmol/L   Premenopausal ROMA 1.16 (H) See below   Postmenopausal ROMA 0.86 See below   Comment Comment   Premenopausal Interp: HIGH   Collection Time: 07/15/23  9:46 AM  Result Value Ref Range   Premenopausal Interp: HIGH Comment   Postmenopausal Interp: LOW   Collection Time: 07/15/23  9:46 AM  Result Value Ref Range   Postmenopausal Interp: LOW Comment    Lab Results  Component Value Date   NA 142 07/15/2023   CL 105 07/15/2023   K 4.2 07/15/2023   CO2 27 07/15/2023   BUN 15 07/15/2023   CREATININE 0.94 07/15/2023   GFR 63.27 07/15/2023   CALCIUM  9.6 07/15/2023   PHOS 3.6 07/15/2023   ALBUMIN 4.3 07/15/2023   GLUCOSE 92 07/15/2023    Assessment & Plan:   Problem List Items Addressed This Visit     Essential hypertension - Primary   Chronic, deteriorated control since switch from benazepril  20mg  to losartan  50mg  - will increase to full 100mg  losartan  tablets.  Home and our office cuff have comparable readings today.  Continue monitoring at home - new BP log sheet provided to keep track.   RTC 3 mo HTN f/u visit  Continue bisoprolol  5mg  and spironolactone  25mg  daily. Takes all meds in am.       Relevant Medications   gelatin absorbable (SURGIFOAM) 50 sponge   losartan  (COZAAR ) 100 MG tablet   Other Relevant Orders  Basic metabolic panel with GFR   Hot flashes due to surgical menopause   Appreciate gyn care now on gabapentin   300mg  bid        Meds ordered this encounter  Medications   losartan  (COZAAR ) 100 MG tablet    Sig: Take 1 tablet (100 mg total) by mouth daily.    Dispense:  90 tablet    Refill:  2    Note new dose    Orders Placed This Encounter  Procedures   Basic metabolic panel with GFR    Standing Status:   Future    Expiration Date:   10/17/2024    Patient Instructions  BP was too high in office - continue monitoring at home.  Increase losartan  to 100mg  in the morning.  Continue bisoprolol  and spironolactone .  Schedule lab visit in 1 week to recheck kidney function on new regimen.  Return in 3 months for hypertension follow up visit  Follow up plan: Return in about 3 months (around 01/18/2024) for follow up visit.  Claire Crick, MD

## 2023-10-28 ENCOUNTER — Other Ambulatory Visit (INDEPENDENT_AMBULATORY_CARE_PROVIDER_SITE_OTHER)

## 2023-10-28 DIAGNOSIS — I10 Essential (primary) hypertension: Secondary | ICD-10-CM

## 2023-10-28 LAB — BASIC METABOLIC PANEL WITH GFR
BUN: 17 mg/dL (ref 6–23)
CO2: 27 meq/L (ref 19–32)
Calcium: 9.4 mg/dL (ref 8.4–10.5)
Chloride: 103 meq/L (ref 96–112)
Creatinine, Ser: 1.02 mg/dL (ref 0.40–1.20)
GFR: 57.25 mL/min — ABNORMAL LOW (ref >60.00)
Glucose, Bld: 95 mg/dL (ref 70–99)
Potassium: 3.8 meq/L (ref 3.5–5.1)
Sodium: 137 meq/L (ref 135–145)

## 2023-11-02 ENCOUNTER — Ambulatory Visit: Payer: Self-pay | Admitting: Family Medicine

## 2023-11-02 ENCOUNTER — Other Ambulatory Visit: Payer: Self-pay | Admitting: Family Medicine

## 2023-11-02 DIAGNOSIS — Z1231 Encounter for screening mammogram for malignant neoplasm of breast: Secondary | ICD-10-CM

## 2023-11-25 DIAGNOSIS — H2513 Age-related nuclear cataract, bilateral: Secondary | ICD-10-CM | POA: Diagnosis not present

## 2023-11-29 ENCOUNTER — Ambulatory Visit

## 2023-12-06 ENCOUNTER — Other Ambulatory Visit: Payer: Self-pay | Admitting: Family Medicine

## 2023-12-06 DIAGNOSIS — N951 Menopausal and female climacteric states: Secondary | ICD-10-CM

## 2023-12-13 ENCOUNTER — Other Ambulatory Visit (HOSPITAL_COMMUNITY): Payer: Self-pay

## 2023-12-13 MED ORDER — GABAPENTIN 600 MG PO TABS
600.0000 mg | ORAL_TABLET | Freq: Every day | ORAL | 3 refills | Status: DC
  Filled 2023-12-13: qty 30, 30d supply, fill #0

## 2023-12-14 ENCOUNTER — Ambulatory Visit
Admission: RE | Admit: 2023-12-14 | Discharge: 2023-12-14 | Disposition: A | Discharge status: Home / Self Care | Source: Ambulatory Visit | Attending: Family Medicine | Admitting: Family Medicine

## 2023-12-14 DIAGNOSIS — Z1231 Encounter for screening mammogram for malignant neoplasm of breast: Secondary | ICD-10-CM

## 2023-12-15 ENCOUNTER — Other Ambulatory Visit: Payer: Self-pay

## 2023-12-15 ENCOUNTER — Other Ambulatory Visit: Payer: Self-pay | Admitting: Family Medicine

## 2023-12-15 DIAGNOSIS — S46812D Strain of other muscles, fascia and tendons at shoulder and upper arm level, left arm, subsequent encounter: Secondary | ICD-10-CM

## 2023-12-15 NOTE — Telephone Encounter (Signed)
 Robaxin  Last filled:  02/10/24, #30 Last OV:  10/18/23, HTN f/u Next OV:  01/18/24, 3 mo HTN f/u

## 2023-12-16 ENCOUNTER — Other Ambulatory Visit (HOSPITAL_COMMUNITY): Payer: Self-pay

## 2023-12-16 ENCOUNTER — Ambulatory Visit: Payer: Self-pay | Admitting: Family Medicine

## 2023-12-16 MED ORDER — METHOCARBAMOL 500 MG PO TABS
500.0000 mg | ORAL_TABLET | Freq: Every day | ORAL | 0 refills | Status: DC
Start: 2023-12-16 — End: 2024-04-21
  Filled 2023-12-16: qty 30, 30d supply, fill #0

## 2023-12-16 NOTE — Telephone Encounter (Signed)
 ERx

## 2023-12-27 ENCOUNTER — Ambulatory Visit: Payer: PRIVATE HEALTH INSURANCE | Attending: Orthopaedic Surgery | Primary: Family

## 2023-12-31 ENCOUNTER — Encounter: Payer: Self-pay | Admitting: Family Medicine

## 2023-12-31 ENCOUNTER — Other Ambulatory Visit (HOSPITAL_COMMUNITY): Payer: Self-pay

## 2023-12-31 ENCOUNTER — Ambulatory Visit: Admitting: Family Medicine

## 2023-12-31 DIAGNOSIS — N951 Menopausal and female climacteric states: Secondary | ICD-10-CM

## 2023-12-31 MED ORDER — GABAPENTIN 600 MG PO TABS
ORAL_TABLET | ORAL | 12 refills | Status: AC
  Filled 2023-12-31: qty 45, fill #0
  Filled 2024-01-08: qty 45, 30d supply, fill #0
  Filled 2024-02-07: qty 45, 30d supply, fill #1
  Filled 2024-03-08: qty 45, 30d supply, fill #2
  Filled 2024-04-07: qty 45, 30d supply, fill #3
  Filled 2024-05-09: qty 45, 30d supply, fill #4
  Filled 2024-06-10: qty 45, 30d supply, fill #5
  Filled 2024-07-16: qty 45, 30d supply, fill #6

## 2023-12-31 NOTE — Progress Notes (Signed)
 CC: Discuss Hormones   refills on her gabapentin - needing   Wanting to discuss something less harsh on the kidneys

## 2023-12-31 NOTE — Progress Notes (Signed)
   GYNECOLOGY PROBLEM  VISIT ENCOUNTER NOTE  Subjective:   Jacqueline Waters is a 68 y.o. [id number] female here for a problem GYN visit.  Current complaints: hot flashes, reports they are improving. Gabapentin -  helping at night time for flashes. Tried 1/2 in AM and PM which helps a lot with night and daytime hot flashes  Denies abnormal vaginal bleeding, discharge, pelvic pain, problems with intercourse or other gynecologic concerns.    Gynecologic History No LMP recorded. Patient has had a hysterectomy.  Contraception: post menopausal status  Health Maintenance Due  Topic Date Due   COVID-19 Vaccine (5 - 2024-25 season) 02/14/2023    The following portions of the patient's history were reviewed and updated as appropriate: allergies, current medications, past family history, past medical history, past social history, past surgical history and problem list.  Review of Systems Pertinent items are noted in HPI.   Objective:  BP 126/77   Pulse (!) 49   Wt 149 lb (67.6 kg)   BMI 27.47 kg/m   Gen: well appearing, NAD HEENT: no scleral icterus CV: RR Lung: Normal WOB Ext: warm well perfused   Assessment and Plan:  1. Hot flashes due to menopause - gabapentin  (NEURONTIN ) 600 MG tablet; Take 0.5 tablets (300 mg total) by mouth in the morning AND 1 tablet (600 mg total) at bedtime.  Dispense: 45 tablet; Refill: 12    Please refer to After Visit Summary for other counseling recommendations.   No follow-ups on file.  Suzen Maryan Masters, MD, MPH, ABFM Attending Physician [facility]

## 2024-01-08 ENCOUNTER — Other Ambulatory Visit: Payer: Self-pay

## 2024-01-10 ENCOUNTER — Other Ambulatory Visit (HOSPITAL_COMMUNITY): Payer: Self-pay

## 2024-01-18 ENCOUNTER — Ambulatory Visit (INDEPENDENT_AMBULATORY_CARE_PROVIDER_SITE_OTHER): Admitting: Family Medicine

## 2024-01-18 ENCOUNTER — Encounter: Payer: Self-pay | Admitting: Family Medicine

## 2024-01-18 VITALS — BP 130/78 | HR 55 | Temp 97.8°F | Ht 61.75 in | Wt 147.2 lb

## 2024-01-18 DIAGNOSIS — I10 Essential (primary) hypertension: Secondary | ICD-10-CM

## 2024-01-18 NOTE — Assessment & Plan Note (Addendum)
 Chronic, controlled on presentation however BP elevated on my readings. No missed doses of BP meds. Anticipate component of white coat hypertension. Recommend continue current regimen, she will start monitoring BP more closely at home and let me know if consistently >140/90 to further titrate antihypertensives.  Goal BP <135/85 in h/o CKD.

## 2024-01-18 NOTE — Progress Notes (Signed)
 Ph: [phone] Fax: [phone]   Patient ID: Jacqueline Waters, female    DOB: 01/27/1956, 68 y.o.   MRN: [id number]  This visit was conducted in person.  BP 130/78   Pulse (!) 55   Temp 97.8 F (36.6 C) (Oral)   Ht 5' 1.75 (1.568 m)   Wt 147 lb 4 oz (66.8 kg)   SpO2 100%   BMI 27.15 kg/m   170/96 on right 162/90 on left  CC: HTN  Subjective:   HPI: Jacqueline Waters is a 68 y.o. female presenting on 01/18/2024 for Medical Management of Chronic Issues ( f/u Htn. Pt brought her log sheet for her bp.)   Hot flashes - sees GYN managed with gabapentin  300mg  in am and 600mg  in pm. Doing better with this regimen.   HTN - Compliant with current antihypertensive regimen of bisoprolol  5mg  daily, losartan  100mg  daily, spironolactone  25mg  daily. Does check blood pressures at home - overall ok. Yesterday 157/72 before donating blood. No low blood pressure readings or symptoms of dizziness/syncope. Denies HA, vision changes, CP/tightness, SOB, leg swelling.       Relevant past medical, surgical, family and social history reviewed and updated as indicated. Interim medical history since our last visit reviewed. Allergies and medications reviewed and updated. Outpatient Medications Prior to Visit  Medication Sig Dispense Refill   acetaminophen  (TYLENOL ) 500 MG tablet Take 1 tablet (500 mg total) by mouth daily as needed. 30 tablet 1   atorvastatin  (LIPITOR) 20 MG tablet Take 1 tablet (20 mg total) by mouth every morning. 90 tablet 4   bisoprolol  (ZEBETA ) 5 MG tablet Take 1 tablet (5 mg total) by mouth daily. 90 tablet 4   Cholecalciferol (VITAMIN D3) 25 MCG (1000 UT) CAPS Take 1,000 Units by mouth daily.     Coenzyme Q10 (COQ10) 100 MG CAPS Take 1 capsule by mouth daily.     diclofenac  Sodium (VOLTAREN ) 1 % GEL Apply 2 g topically 4 (four) times daily. (Patient taking differently: Apply 2 g topically daily as needed (pain).) 100 g 2   gabapentin  (NEURONTIN ) 600 MG tablet Take 1/2  tablets (300 mg total) by mouth in the morning AND 1 tablet (600 mg total) at bedtime. 45 tablet 12   gelatin absorbable (SURGIFOAM) 50 sponge Apply 1 each topically once.     hydrOXYzine  (ATARAX ) 25 MG tablet Take 1/2-1 tablet (12.5-25 mg total) by mouth 2 (two) times daily as needed for anxiety. 30 tablet 3   losartan  (COZAAR ) 100 MG tablet Take 1 tablet (100 mg total) by mouth daily. 90 tablet 2   methocarbamol  (ROBAXIN ) 500 MG tablet Take 1 tablet (500 mg total) by mouth at bedtime. 30 tablet 0   spironolactone  (ALDACTONE ) 25 MG tablet Take 1 tablet (25 mg total) by mouth daily. 90 tablet 4   No facility-administered medications prior to visit.     Per HPI unless specifically indicated in ROS section below Review of Systems  Objective:  BP 130/78   Pulse (!) 55   Temp 97.8 F (36.6 C) (Oral)   Ht 5' 1.75 (1.568 m)   Wt 147 lb 4 oz (66.8 kg)   SpO2 100%   BMI 27.15 kg/m   Wt Readings from Last 3 Encounters:  01/18/24 147 lb 4 oz (66.8 kg)  12/31/23 149 lb (67.6 kg)  10/18/23 144 lb (65.3 kg)      Physical Exam Vitals and nursing note reviewed.  Constitutional:      Appearance: Normal  appearance. She is not ill-appearing.  HENT:     Head: Normocephalic and atraumatic.     Mouth/Throat:     Mouth: Mucous membranes are moist.     Pharynx: Oropharynx is clear. No oropharyngeal exudate or posterior oropharyngeal erythema.  Neck:     Thyroid : No thyroid  mass or thyromegaly.  Cardiovascular:     Rate and Rhythm: Normal rate and regular rhythm.     Pulses: Normal pulses.     Heart sounds: Normal heart sounds. No murmur heard. Pulmonary:     Effort: Pulmonary effort is normal. No respiratory distress.     Breath sounds: Normal breath sounds. No wheezing, rhonchi or rales.  Musculoskeletal:     Right lower leg: No edema.     Left lower leg: No edema.  Skin:    General: Skin is warm and dry.     Findings: No rash.  Neurological:     Mental Status: She is alert.   Psychiatric:        Mood and Affect: Mood normal.        Behavior: Behavior normal.       Results for orders placed or performed in visit on 10/28/23  Basic metabolic panel with GFR   Collection Time: 10/28/23  7:59 AM  Result Value Ref Range   Sodium 137 135 - 145 mEq/L   Potassium 3.8 3.5 - 5.1 mEq/L   Chloride 103 96 - 112 mEq/L   CO2 27 19 - 32 mEq/L   Glucose, Bld 95 70 - 99 mg/dL   BUN 17 6 - 23 mg/dL   Creatinine, Ser 8.97 0.40 - 1.20 mg/dL   GFR 42.74 (L) >39.99 mL/min   Calcium  9.4 8.4 - 10.5 mg/dL    Assessment & Plan:   Problem List Items Addressed This Visit     Essential hypertension - Primary   Chronic, controlled on presentation however BP elevated on my readings. No missed doses of BP meds. Anticipate component of white coat hypertension. Recommend continue current regimen, she will start monitoring BP more closely at home and let me know if consistently >140/90 to further titrate antihypertensives.  Goal BP <135/85 in h/o CKD.         No orders of the defined types were placed in this encounter.   No orders of the defined types were placed in this encounter.   Patient Instructions  BP was staying elevated - start checking at home and keeping track. Let me know how readings are in a few weeks Return in 3 months for BP f/u visit  Continue current medicines.   Follow up plan: Return in about 3 months (around 04/19/2024), or if symptoms worsen or fail to improve, for follow up visit.  Anton Blas, MD

## 2024-01-18 NOTE — Patient Instructions (Signed)
 BP was staying elevated - start checking at home and keeping track. Let me know how readings are in a few weeks Return in 3 months for BP f/u visit  Continue current medicines.

## 2024-02-05 ENCOUNTER — Encounter

## 2024-02-07 MED ORDER — MELOXICAM 15 MG PO TABS
15 | ORAL_TABLET | Freq: Every day | ORAL | 1 refills | Status: AC
Start: 2024-02-07 — End: ?

## 2024-03-09 ENCOUNTER — Other Ambulatory Visit (HOSPITAL_COMMUNITY): Payer: Self-pay

## 2024-04-17 ENCOUNTER — Encounter: Payer: Self-pay | Admitting: Radiology

## 2024-04-21 ENCOUNTER — Other Ambulatory Visit (HOSPITAL_COMMUNITY): Payer: Self-pay

## 2024-04-21 ENCOUNTER — Encounter: Payer: Self-pay | Admitting: Family Medicine

## 2024-04-21 ENCOUNTER — Ambulatory Visit: Admitting: Family Medicine

## 2024-04-21 VITALS — BP 150/82 | HR 62 | Temp 98.1°F | Ht 61.75 in | Wt 147.5 lb

## 2024-04-21 DIAGNOSIS — I10 Essential (primary) hypertension: Secondary | ICD-10-CM

## 2024-04-21 DIAGNOSIS — S46812D Strain of other muscles, fascia and tendons at shoulder and upper arm level, left arm, subsequent encounter: Secondary | ICD-10-CM

## 2024-04-21 MED ORDER — METHOCARBAMOL 500 MG PO TABS
500.0000 mg | ORAL_TABLET | Freq: Every day | ORAL | 1 refills | Status: AC
  Filled 2024-04-21: qty 30, 30d supply, fill #0
  Filled 2024-05-23: qty 30, 30d supply, fill #1

## 2024-04-21 NOTE — Assessment & Plan Note (Signed)
 Chronic, improved control on current regimen - continue this.  She notes some lightheadedness , and takes all BP meds at same time in the morning. Will change bisoprolol  5mg  to night time dosing.  Right arm BP mildly elevated today, but home readings largely well controlled - anticipate component of white coat hypertension. Reassess control at CPE

## 2024-04-21 NOTE — Progress Notes (Signed)
 Ph: [phone] Fax: [phone]   Patient ID: Jacqueline Waters, female    DOB: 1956/06/10, 68 y.o.   MRN: [id number]  This visit was conducted in person.  BP (!) 150/82   Pulse 62   Temp 98.1 F (36.7 C) (Oral)   Ht 5' 1.75 (1.568 m)   Wt 147 lb 8 oz (66.9 kg)   SpO2 99%   BMI 27.20 kg/m   Orthostatic Vitals for the past 48 hrs (Last 6 readings):  Patient Position Orthostatic BP BP Pulse BP Location Cuff Size  04/21/24 0923 -- -- (!) 150/82 62 -- --  04/21/24 0927 Supine 130/82 -- -- Left Arm Normal  04/21/24 1005 Standing (!) 138/100 -- -- Left Arm Normal   R arm 146/82 L arm 136/78   CC: HTN f/u visit  Subjective:   HPI: Jacqueline Waters is a 68 y.o. female presenting on 04/21/2024 for Hypertension (Pt here for 3 month f/u for HTN)   HTN - Compliant with current antihypertensive regimen of bisoprolol  5mg  daily, losartan  100mg  daily, spironolactone  25mg  daily. Does check blood pressures at home: 117-139/70-80s. HR 45-60. Occasional lightheadedness. No low blood pressure readings or syncope. Denies HA, vision changes, CP/tightness, SOB, leg swelling.   Goal BP <135/85 on repeat testing.      Relevant past medical, surgical, family and social history reviewed and updated as indicated. Interim medical history since our last visit reviewed. Allergies and medications reviewed and updated. Outpatient Medications Prior to Visit  Medication Sig Dispense Refill   acetaminophen  (TYLENOL ) 500 MG tablet Take 1 tablet (500 mg total) by mouth daily as needed. 30 tablet 1   atorvastatin  (LIPITOR) 20 MG tablet Take 1 tablet (20 mg total) by mouth every morning. 90 tablet 4   bisoprolol  (ZEBETA ) 5 MG tablet Take 1 tablet (5 mg total) by mouth daily. 90 tablet 4   Cholecalciferol (VITAMIN D3) 25 MCG (1000 UT) CAPS Take 1,000 Units by mouth daily.     Coenzyme Q10 (COQ10) 100 MG CAPS Take 1 capsule by mouth daily.     diclofenac  Sodium (VOLTAREN ) 1 % GEL Apply 2 g topically 4 (four)  times daily. (Patient taking differently: Apply 2 g topically daily as needed (pain).) 100 g 2   gabapentin  (NEURONTIN ) 600 MG tablet Take 1/2 tablets (300 mg total) by mouth in the morning AND 1 tablet (600 mg total) at bedtime. 45 tablet 12   gelatin absorbable (SURGIFOAM) 50 sponge Apply 1 each topically once.     hydrOXYzine  (ATARAX ) 25 MG tablet Take 1/2-1 tablet (12.5-25 mg total) by mouth 2 (two) times daily as needed for anxiety. 30 tablet 3   losartan  (COZAAR ) 100 MG tablet Take 1 tablet (100 mg total) by mouth daily. 90 tablet 2   spironolactone  (ALDACTONE ) 25 MG tablet Take 1 tablet (25 mg total) by mouth daily. 90 tablet 4   methocarbamol  (ROBAXIN ) 500 MG tablet Take 1 tablet (500 mg total) by mouth at bedtime. 30 tablet 0   No facility-administered medications prior to visit.     Per HPI unless specifically indicated in ROS section below Review of Systems  Objective:  BP (!) 150/82   Pulse 62   Temp 98.1 F (36.7 C) (Oral)   Ht 5' 1.75 (1.568 m)   Wt 147 lb 8 oz (66.9 kg)   SpO2 99%   BMI 27.20 kg/m   Wt Readings from Last 3 Encounters:  04/21/24 147 lb 8 oz (66.9 kg)  01/18/24 147  lb 4 oz (66.8 kg)  12/31/23 149 lb (67.6 kg)      Physical Exam Vitals and nursing note reviewed.  Constitutional:      Appearance: Normal appearance. She is not ill-appearing.  HENT:     Mouth/Throat:     Mouth: Mucous membranes are moist.     Pharynx: Oropharynx is clear. No oropharyngeal exudate or posterior oropharyngeal erythema.  Eyes:     Extraocular Movements: Extraocular movements intact.     Pupils: Pupils are equal, round, and reactive to light.  Cardiovascular:     Rate and Rhythm: Normal rate and regular rhythm.     Pulses: Normal pulses.     Heart sounds: Normal heart sounds. No murmur heard. Pulmonary:     Effort: Pulmonary effort is normal. No respiratory distress.     Breath sounds: Normal breath sounds. No wheezing, rhonchi or rales.  Musculoskeletal:      Right lower leg: No edema.     Left lower leg: No edema.  Skin:    General: Skin is warm and dry.  Neurological:     Mental Status: She is alert.  Psychiatric:        Mood and Affect: Mood normal.        Behavior: Behavior normal.       Results for orders placed or performed in visit on 10/28/23  Basic metabolic panel with GFR   Collection Time: 10/28/23  7:59 AM  Result Value Ref Range   Sodium 137 135 - 145 mEq/L   Potassium 3.8 3.5 - 5.1 mEq/L   Chloride 103 96 - 112 mEq/L   CO2 27 19 - 32 mEq/L   Glucose, Bld 95 70 - 99 mg/dL   BUN 17 6 - 23 mg/dL   Creatinine, Ser 8.97 0.40 - 1.20 mg/dL   GFR 42.74 (L) >39.99 mL/min   Calcium  9.4 8.4 - 10.5 mg/dL    Assessment & Plan:   Problem List Items Addressed This Visit     Essential hypertension - Primary   Chronic, improved control on current regimen - continue this.  She notes some lightheadedness , and takes all BP meds at same time in the morning. Will change bisoprolol  5mg  to night time dosing.  Right arm BP mildly elevated today, but home readings largely well controlled - anticipate component of white coat hypertension. Reassess control at CPE      Other Visit Diagnoses       Trapezius muscle strain, left, subsequent encounter       Relevant Medications   methocarbamol  (ROBAXIN ) 500 MG tablet        Meds ordered this encounter  Medications   methocarbamol  (ROBAXIN ) 500 MG tablet    Sig: Take 1 tablet (500 mg total) by mouth at bedtime.    Dispense:  30 tablet    Refill:  1    No orders of the defined types were placed in this encounter.   Patient Instructions  Orthostatic vital signs today Change bisoprolol  to night time dosing.  Otherwise blood pressures are doing well today!  Continue current medicines, return in 3 months for physical  Follow up plan: No follow-ups on file.  Anton Blas, MD

## 2024-04-21 NOTE — Patient Instructions (Addendum)
 Orthostatic vital signs today Change bisoprolol  to night time dosing.  Otherwise blood pressures are doing well today!  Continue current medicines, return in 3 months for physical

## 2024-04-24 ENCOUNTER — Ambulatory Visit: Admitting: Family Medicine

## 2024-05-09 ENCOUNTER — Other Ambulatory Visit: Payer: Self-pay | Admitting: Family Medicine

## 2024-05-09 DIAGNOSIS — I10 Essential (primary) hypertension: Secondary | ICD-10-CM

## 2024-05-10 ENCOUNTER — Other Ambulatory Visit: Payer: Self-pay

## 2024-05-10 ENCOUNTER — Other Ambulatory Visit (HOSPITAL_COMMUNITY): Payer: Self-pay

## 2024-05-10 MED ORDER — LOSARTAN POTASSIUM 100 MG PO TABS
100.0000 mg | ORAL_TABLET | Freq: Every day | ORAL | 0 refills | Status: AC
  Filled 2024-05-10: qty 90, 90d supply, fill #0

## 2024-06-21 ENCOUNTER — Other Ambulatory Visit (HOSPITAL_COMMUNITY): Payer: Self-pay

## 2024-07-14 ENCOUNTER — Other Ambulatory Visit: Payer: Self-pay | Admitting: Family Medicine

## 2024-07-14 DIAGNOSIS — F411 Generalized anxiety disorder: Secondary | ICD-10-CM

## 2024-07-15 ENCOUNTER — Other Ambulatory Visit: Payer: Self-pay | Admitting: Family Medicine

## 2024-07-15 DIAGNOSIS — E7800 Pure hypercholesterolemia, unspecified: Secondary | ICD-10-CM

## 2024-07-15 DIAGNOSIS — D72819 Decreased white blood cell count, unspecified: Secondary | ICD-10-CM

## 2024-07-15 DIAGNOSIS — N182 Chronic kidney disease, stage 2 (mild): Secondary | ICD-10-CM

## 2024-07-15 DIAGNOSIS — E559 Vitamin D deficiency, unspecified: Secondary | ICD-10-CM

## 2024-07-15 DIAGNOSIS — E210 Primary hyperparathyroidism: Secondary | ICD-10-CM

## 2024-07-16 ENCOUNTER — Other Ambulatory Visit: Payer: Self-pay

## 2024-07-17 ENCOUNTER — Other Ambulatory Visit: Payer: HMO

## 2024-07-17 ENCOUNTER — Other Ambulatory Visit (HOSPITAL_COMMUNITY): Payer: Self-pay

## 2024-07-17 MED ORDER — HYDROXYZINE HCL 25 MG PO TABS
12.5000 mg | ORAL_TABLET | Freq: Two times a day (BID) | ORAL | 3 refills | Status: AC | PRN
  Filled 2024-07-17: qty 30, 15d supply, fill #0

## 2024-07-20 ENCOUNTER — Other Ambulatory Visit

## 2024-07-20 DIAGNOSIS — E559 Vitamin D deficiency, unspecified: Secondary | ICD-10-CM

## 2024-07-20 DIAGNOSIS — N182 Chronic kidney disease, stage 2 (mild): Secondary | ICD-10-CM

## 2024-07-20 DIAGNOSIS — E210 Primary hyperparathyroidism: Secondary | ICD-10-CM

## 2024-07-20 DIAGNOSIS — D72819 Decreased white blood cell count, unspecified: Secondary | ICD-10-CM

## 2024-07-20 DIAGNOSIS — E7800 Pure hypercholesterolemia, unspecified: Secondary | ICD-10-CM

## 2024-07-20 LAB — COMPREHENSIVE METABOLIC PANEL WITH GFR
ALT: 28 U/L (ref 3–35)
AST: 25 U/L (ref 5–37)
Albumin: 4.3 g/dL (ref 3.5–5.2)
Alkaline Phosphatase: 88 U/L (ref 39–117)
BUN: 17 mg/dL (ref 6–23)
CO2: 32 meq/L (ref 19–32)
Calcium: 9.6 mg/dL (ref 8.4–10.5)
Chloride: 103 meq/L (ref 96–112)
Creatinine, Ser: 1.05 mg/dL (ref 0.40–1.20)
GFR: 55.01 mL/min — ABNORMAL LOW
Glucose, Bld: 97 mg/dL (ref 70–99)
Potassium: 4.4 meq/L (ref 3.5–5.1)
Sodium: 140 meq/L (ref 135–145)
Total Bilirubin: 0.5 mg/dL (ref 0.2–1.2)
Total Protein: 7.7 g/dL (ref 6.0–8.3)

## 2024-07-20 LAB — VITAMIN B12: Vitamin B-12: 783 pg/mL (ref 211–911)

## 2024-07-20 LAB — MICROALBUMIN / CREATININE URINE RATIO
Creatinine,U: 228.6 mg/dL
Microalb Creat Ratio: 3.9 mg/g (ref 0.0–30.0)
Microalb, Ur: 0.9 mg/dL (ref 0.7–1.9)

## 2024-07-20 LAB — CBC WITH DIFFERENTIAL/PLATELET
Basophils Absolute: 0 10*3/uL (ref 0.0–0.1)
Basophils Relative: 0.8 % (ref 0.0–3.0)
Eosinophils Absolute: 0.1 10*3/uL (ref 0.0–0.7)
Eosinophils Relative: 2.6 % (ref 0.0–5.0)
HCT: 43.9 % (ref 36.0–46.0)
Hemoglobin: 14.9 g/dL (ref 12.0–15.0)
Lymphocytes Relative: 43.7 % (ref 12.0–46.0)
Lymphs Abs: 1.7 10*3/uL (ref 0.7–4.0)
MCHC: 33.9 g/dL (ref 30.0–36.0)
MCV: 87.1 fl (ref 78.0–100.0)
Monocytes Absolute: 0.4 10*3/uL (ref 0.1–1.0)
Monocytes Relative: 10.1 % (ref 3.0–12.0)
Neutro Abs: 1.7 10*3/uL (ref 1.4–7.7)
Neutrophils Relative %: 42.8 % — ABNORMAL LOW (ref 43.0–77.0)
Platelets: 291 10*3/uL (ref 150.0–400.0)
RBC: 5.04 Mil/uL (ref 3.87–5.11)
RDW: 14.2 % (ref 11.5–15.5)
WBC: 4 10*3/uL (ref 4.0–10.5)

## 2024-07-20 LAB — LIPID PANEL
Cholesterol: 143 mg/dL (ref 28–200)
HDL: 37.5 mg/dL — ABNORMAL LOW
LDL Cholesterol: 90 mg/dL (ref 10–99)
NonHDL: 105.18
Total CHOL/HDL Ratio: 4
Triglycerides: 78 mg/dL (ref 10.0–149.0)
VLDL: 15.6 mg/dL (ref 0.0–40.0)

## 2024-07-20 LAB — VITAMIN D 25 HYDROXY (VIT D DEFICIENCY, FRACTURES): VITD: 34.42 ng/mL (ref 30.00–100.00)

## 2024-07-21 LAB — PARATHYROID HORMONE, INTACT (NO CA): PTH: 16 pg/mL (ref 16–77)

## 2024-07-24 ENCOUNTER — Encounter: Payer: HMO | Admitting: Family Medicine
